# Patient Record
Sex: Male | Born: 1980
Health system: Southern US, Community
[De-identification: ages and names within clinical notes are randomized; demographics above are authoritative.]

## PROBLEM LIST (undated history)

## (undated) DIAGNOSIS — I509 Heart failure, unspecified: Secondary | ICD-10-CM

## (undated) DIAGNOSIS — E119 Type 2 diabetes mellitus without complications: Secondary | ICD-10-CM

## (undated) DIAGNOSIS — K219 Gastro-esophageal reflux disease without esophagitis: Secondary | ICD-10-CM

## (undated) DIAGNOSIS — G4733 Obstructive sleep apnea (adult) (pediatric): Secondary | ICD-10-CM

## (undated) DIAGNOSIS — F1210 Cannabis abuse, uncomplicated: Secondary | ICD-10-CM

## (undated) DIAGNOSIS — I456 Pre-excitation syndrome: Secondary | ICD-10-CM

## (undated) DIAGNOSIS — Z720 Tobacco use: Secondary | ICD-10-CM

## (undated) DIAGNOSIS — I5020 Unspecified systolic (congestive) heart failure: Secondary | ICD-10-CM

## (undated) DIAGNOSIS — I10 Essential (primary) hypertension: Secondary | ICD-10-CM

## (undated) DIAGNOSIS — I428 Other cardiomyopathies: Secondary | ICD-10-CM

## (undated) HISTORY — DX: Heart failure, unspecified: I50.9

## (undated) HISTORY — DX: Obstructive sleep apnea (adult) (pediatric): G47.33

## (undated) HISTORY — DX: Morbid (severe) obesity due to excess calories: E66.01

## (undated) HISTORY — DX: Type 2 diabetes mellitus without complications: E11.9

## (undated) HISTORY — DX: Gastro-esophageal reflux disease without esophagitis: K21.9

## (undated) HISTORY — DX: Essential (primary) hypertension: I10

## (undated) HISTORY — PX: CARDIAC CATHETERIZATION: SHX172

---

## 2000-03-21 HISTORY — PX: FRACTURE SURGERY: SHX138

## 2008-01-30 ENCOUNTER — Emergency Department: Payer: Self-pay | Admitting: Internal Medicine

## 2008-02-04 ENCOUNTER — Ambulatory Visit: Payer: Self-pay | Admitting: Family Medicine

## 2008-02-11 ENCOUNTER — Ambulatory Visit: Payer: Self-pay | Admitting: Family Medicine

## 2009-03-05 ENCOUNTER — Emergency Department (HOSPITAL_COMMUNITY): Admission: EM | Admit: 2009-03-05 | Discharge: 2009-03-05 | Payer: Self-pay | Admitting: Emergency Medicine

## 2010-04-11 ENCOUNTER — Emergency Department (HOSPITAL_COMMUNITY)
Admission: EM | Admit: 2010-04-11 | Discharge: 2010-04-11 | Payer: Self-pay | Source: Home / Self Care | Admitting: Emergency Medicine

## 2010-04-13 ENCOUNTER — Emergency Department (HOSPITAL_COMMUNITY)
Admission: EM | Admit: 2010-04-13 | Discharge: 2010-04-13 | Payer: Self-pay | Source: Home / Self Care | Admitting: Family Medicine

## 2010-04-13 ENCOUNTER — Emergency Department (HOSPITAL_COMMUNITY)
Admission: EM | Admit: 2010-04-13 | Discharge: 2010-04-13 | Payer: Self-pay | Source: Home / Self Care | Admitting: Emergency Medicine

## 2010-04-14 LAB — RAPID STREP SCREEN (MED CTR MEBANE ONLY): Streptococcus, Group A Screen (Direct): NEGATIVE

## 2013-07-09 ENCOUNTER — Emergency Department: Payer: Self-pay | Admitting: Emergency Medicine

## 2013-07-09 LAB — COMPREHENSIVE METABOLIC PANEL
AST: 17 U/L (ref 15–37)
Albumin: 3.4 g/dL (ref 3.4–5.0)
Alkaline Phosphatase: 97 U/L
Anion Gap: 5 — ABNORMAL LOW (ref 7–16)
BUN: 10 mg/dL (ref 7–18)
Bilirubin,Total: 0.3 mg/dL (ref 0.2–1.0)
CREATININE: 0.93 mg/dL (ref 0.60–1.30)
Calcium, Total: 9.4 mg/dL (ref 8.5–10.1)
Chloride: 99 mmol/L (ref 98–107)
Co2: 31 mmol/L (ref 21–32)
EGFR (Non-African Amer.): >60
Glucose: 303 mg/dL — ABNORMAL HIGH (ref 65–99)
Osmolality: 281 (ref 275–301)
POTASSIUM: 3.9 mmol/L (ref 3.5–5.1)
SGPT (ALT): 35 U/L (ref 12–78)
Sodium: 135 mmol/L — ABNORMAL LOW (ref 136–145)
Total Protein: 8.2 g/dL (ref 6.4–8.2)

## 2013-07-09 LAB — CBC
HCT: 39.7 % — ABNORMAL LOW (ref 40.0–52.0)
HGB: 12.9 g/dL — ABNORMAL LOW (ref 13.0–18.0)
MCH: 29 pg (ref 26.0–34.0)
MCHC: 32.4 g/dL (ref 32.0–36.0)
MCV: 89 fL (ref 80–100)
Platelet: 295 10*3/uL (ref 150–440)
RBC: 4.44 10*6/uL (ref 4.40–5.90)
RDW: 13.8 % (ref 11.5–14.5)
WBC: 6.4 10*3/uL (ref 3.8–10.6)

## 2013-07-09 LAB — URINALYSIS, COMPLETE
BACTERIA: NONE SEEN
BILIRUBIN, UR: NEGATIVE
Glucose,UR: >=500 mg/dL (ref 0–75)
Leukocyte Esterase: NEGATIVE
Nitrite: NEGATIVE
Ph: 6 (ref 4.5–8.0)
Protein: 30
Specific Gravity: 1.028 (ref 1.003–1.030)
WBC UR: 1 /HPF (ref 0–5)

## 2013-07-09 LAB — HEMOGLOBIN A1C: HEMOGLOBIN A1C: 10.8 % — AB (ref 4.2–6.3)

## 2013-07-09 LAB — LIPASE, BLOOD: Lipase: 122 U/L (ref 73–393)

## 2013-07-10 ENCOUNTER — Telehealth: Payer: Self-pay | Admitting: Adult Health

## 2013-07-10 DIAGNOSIS — E119 Type 2 diabetes mellitus without complications: Secondary | ICD-10-CM

## 2013-07-10 HISTORY — DX: Type 2 diabetes mellitus without complications: E11.9

## 2013-07-10 NOTE — Telephone Encounter (Signed)
Pt has appointment 07/22/13 with raquel He went to er 07/09/13  Was diagnosed with hernia and type 2 diabetes and armc wanted him to call to see if he could be see sooner  Off on Monday    And needs am appointment.  Goes to work at [employer] pt 07/17/13 @ 3:30

## 2013-07-11 NOTE — Telephone Encounter (Signed)
Please advise if we can move this pt to an earlier appt

## 2013-07-13 NOTE — Telephone Encounter (Signed)
Sorry, I do not have any opening for new pt

## 2013-07-15 NOTE — Telephone Encounter (Signed)
Raquel had cancellation on 4/29 @ 1:30 Offered pt that appointment pt stated he would wait till his appointment on 5/7

## 2013-07-22 ENCOUNTER — Ambulatory Visit (INDEPENDENT_AMBULATORY_CARE_PROVIDER_SITE_OTHER): Payer: BC Managed Care – PPO | Admitting: Adult Health

## 2013-07-22 ENCOUNTER — Encounter: Payer: Self-pay | Admitting: Adult Health

## 2013-07-22 VITALS — BP 148/86 | HR 81 | Resp 13 | Ht 70.0 in | Wt 383.8 lb

## 2013-07-22 DIAGNOSIS — E669 Obesity, unspecified: Secondary | ICD-10-CM

## 2013-07-22 DIAGNOSIS — K429 Umbilical hernia without obstruction or gangrene: Secondary | ICD-10-CM

## 2013-07-22 DIAGNOSIS — R3129 Other microscopic hematuria: Secondary | ICD-10-CM

## 2013-07-22 DIAGNOSIS — E1169 Type 2 diabetes mellitus with other specified complication: Secondary | ICD-10-CM

## 2013-07-22 DIAGNOSIS — R361 Hematospermia: Secondary | ICD-10-CM

## 2013-07-22 DIAGNOSIS — E119 Type 2 diabetes mellitus without complications: Secondary | ICD-10-CM

## 2013-07-22 LAB — POCT URINALYSIS DIPSTICK
Bilirubin, UA: NEGATIVE
Glucose, UA: NEGATIVE
KETONES UA: NEGATIVE
Leukocytes, UA: NEGATIVE
Nitrite, UA: NEGATIVE
PH UA: 7.5
SPEC GRAV UA: 1.02
Urobilinogen, UA: 0.2

## 2013-07-22 MED ORDER — ONETOUCH ULTRASOFT LANCETS MISC: Status: DC

## 2013-07-22 MED ORDER — GLUCOSE BLOOD VI STRP: ORAL_STRIP | Status: DC

## 2013-07-22 NOTE — Patient Instructions (Signed)
   Thank you for choosing [place] [facility] for your health care needs.  Please have your labs drawn prior to leaving the office.  I will call you with the results and further instruction.  I am referring you to a general surgeon for consultation on your umbilical hernia. You will need to get your diabetes well controlled prior to having surgery.  Return for follow up in 1 month for your diabetes.  Please check your blood sugar 4 times a day (before breakfast, before lunch, before dinner and before bedtime).  Record all and bring with you on your next appointment.  Walk 3-4 times a week. Walk for 20-30 minutes.   I am referring you for diabetic education. They will call you with an appointment.

## 2013-07-22 NOTE — Progress Notes (Signed)
Pre visit review using our clinic review tool, if applicable. No additional management support is needed unless otherwise documented below in the visit note. 

## 2013-07-22 NOTE — Progress Notes (Signed)
Subjective:    Patient ID: [place], male    DOB: 07/25/1980, 33 y.o.   MRN: [id number]  HPI Pt is a 33 y/o male who presents to clinic to establish care. Has not had a primary care in many years. Recently was seen at [facility] ED. Diagnosed with diabetes and started on metformin. He does not have a glucometer. Has tried to diet and reports that he will go days with barely eating anything then he gets so hungry he binges. He knows he has to lose weight and has also started to walk. Pt has several additional concerns:  1) Umbilical hernia. Pt first felt a knot on his abdomen a few months ago. Reports that sometimes it sticks out more than other times. No problems with his bowels. The hernia is uncomfortable. He had films done while in the ED. Will request records.  2) Microscopic hematuria - he reports that he has been told that he has blood in his urine in the past. He has been seen by a urologist is the past. Does not remember the doctors name.  3) blood in semen - He first noticed this approximately 10 years ago. This is still ongoing. [facility] and he reports that they have never been able to tell him why this is happening.   Past Medical History  Diagnosis Date  . Diabetes mellitus without complication 07/10/13  . GERD (gastroesophageal reflux disease)   . Hypertension      Past Surgical History  Procedure Laterality Date  . Fracture surgery Left 2002    fractured arm     Family History  Problem Relation Age of Onset  . Diabetes Maternal Grandmother   . Diabetes Paternal Grandfather      History   Social History  . Marital Status: Single    Spouse Name: N/A    Number of Children: 2  . Years of Education: N/A   Occupational History  . Customer Care Representative     [employer]   Social History Main Topics  . Smoking status: Former Smoker -- 0.25 packs/day for 15 years    Types: Cigarettes    Quit date: 01/22/2013  . Smokeless tobacco: Never Used    . Alcohol Use: Yes     Comment: Occassional beer, shots of liquor  . Drug Use: 2.00 per week    Special: Marijuana     Comment: 1 time per week  . Sexual Activity: Not on file   Other Topics Concern  . Not on file   Social History Narrative   [place] He is currently living in [place]. He has a daughter and son [place], [place]). 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     Review of Systems  Constitutional: Negative.   HENT: Negative.   Eyes: Negative.   Respiratory: Negative.   Cardiovascular: Negative.   Gastrointestinal: Negative.  Negative for nausea and vomiting.       Umbilical hernia  Endocrine: Negative.   Genitourinary: Negative.  Negative for dysuria, difficulty urinating and testicular pain. Hematuria: microscopic hematuria hx of.       Blood in semen. Reports this [facility]  Musculoskeletal: Negative.   Skin: Negative.   Allergic/Immunologic: Negative.   Neurological: Negative.   Hematological: Negative.   Psychiatric/Behavioral: Negative.   All other systems reviewed and are negative.      Objective:   Physical Exam  Constitutional: He is oriented to person, place,  and time.  Pleasant 33 y/o male in NAD. Newly diagnosed with diabetes. Morbid obesity  Eyes: Conjunctivae and EOM are normal.  Neck: Normal range of motion. Neck supple.  Cardiovascular: Normal rate, regular rhythm and normal heart sounds.  Exam reveals no gallop and no friction rub.   No murmur heard. Pulmonary/Chest: Effort normal and breath sounds normal. No respiratory distress. He has no wheezes. He has no rales.  Abdominal: Soft.  Umbilical hernia right above umbilicus  Neurological: He is alert and oriented to person, place, and time. Coordination normal.  Psychiatric: He has a normal mood and affect. His behavior is normal. Judgment and thought content normal.      Assessment & Plan:    1. Blood in semen Unable to obtain any records through care  everywhere. Check urinalysis, culture. Consider referral to Urology. - POCT urinalysis dipstick  2. Diabetes mellitus type 2 in obese Check hgbA1c. Request other labs done at [facility]. Adjust medications depending on results. Education regarding use of glucometer. I am referring him for diabetic education. Encouraged to increase activity. Spent > 30 min in face to face communication pertaining to problems listed and in the education, evaluation, planning and implementation of care pertaining to this problem. Return in 1 month for follow up with BG readings. - Microalbumin, urine - Ambulatory referral to diabetic education - Hemoglobin A1c; Future  3. Microscopic hematuria Check urine. - Urinalysis, Routine w reflex microscopic  4. Umbilical hernia Refer to sgy. Discussed with pt that he will need to get his BG under control prior to any surgery. I am referring him to discuss options and for planning surgery if needed. Request films from hospital. - Ambulatory referral to General Surgery

## 2013-07-23 ENCOUNTER — Other Ambulatory Visit: Payer: BC Managed Care – PPO

## 2013-07-23 LAB — MICROALBUMIN, URINE: MICROALB UR: 5.39 mg/dL — AB (ref 0.00–1.89)

## 2013-07-24 ENCOUNTER — Other Ambulatory Visit: Payer: Self-pay | Admitting: Adult Health

## 2013-07-24 DIAGNOSIS — R361 Hematospermia: Secondary | ICD-10-CM

## 2013-07-24 LAB — URINALYSIS, ROUTINE W REFLEX MICROSCOPIC
Bilirubin Urine: NEGATIVE
Hgb urine dipstick: NEGATIVE
KETONES UR: NEGATIVE
LEUKOCYTES UA: NEGATIVE
Nitrite: NEGATIVE
PH: 7.5 (ref 5.0–8.0)
RBC / HPF: NONE SEEN (ref 0–?)
SPECIFIC GRAVITY, URINE: 1.02 (ref 1.000–1.030)
TOTAL PROTEIN, URINE-UPE24: NEGATIVE
URINE GLUCOSE: NEGATIVE
Urobilinogen, UA: 0.2 (ref 0.0–1.0)
WBC, UA: NONE SEEN (ref 0–?)

## 2013-07-30 ENCOUNTER — Ambulatory Visit: Payer: Self-pay | Admitting: General Surgery

## 2013-07-31 ENCOUNTER — Telehealth: Payer: Self-pay

## 2013-07-31 NOTE — Telephone Encounter (Signed)
Relevant patient education assigned to patient using Emmi. ° °

## 2013-08-13 ENCOUNTER — Encounter: Payer: Self-pay | Admitting: *Deleted

## 2013-08-27 ENCOUNTER — Encounter: Payer: Self-pay | Admitting: Adult Health

## 2013-08-27 ENCOUNTER — Ambulatory Visit (INDEPENDENT_AMBULATORY_CARE_PROVIDER_SITE_OTHER): Payer: BC Managed Care – PPO | Admitting: Adult Health

## 2013-08-27 VITALS — BP 144/90 | HR 70 | Temp 98.0°F | Resp 14 | Wt 377.0 lb

## 2013-08-27 DIAGNOSIS — K429 Umbilical hernia without obstruction or gangrene: Secondary | ICD-10-CM

## 2013-08-27 DIAGNOSIS — E118 Type 2 diabetes mellitus with unspecified complications: Secondary | ICD-10-CM

## 2013-08-27 DIAGNOSIS — E669 Obesity, unspecified: Secondary | ICD-10-CM

## 2013-08-27 DIAGNOSIS — E1165 Type 2 diabetes mellitus with hyperglycemia: Secondary | ICD-10-CM | POA: Insufficient documentation

## 2013-08-27 DIAGNOSIS — E785 Hyperlipidemia, unspecified: Secondary | ICD-10-CM

## 2013-08-27 DIAGNOSIS — R809 Proteinuria, unspecified: Secondary | ICD-10-CM | POA: Insufficient documentation

## 2013-08-27 DIAGNOSIS — IMO0001 Reserved for inherently not codable concepts without codable children: Secondary | ICD-10-CM

## 2013-08-27 DIAGNOSIS — E119 Type 2 diabetes mellitus without complications: Secondary | ICD-10-CM

## 2013-08-27 DIAGNOSIS — R361 Hematospermia: Secondary | ICD-10-CM

## 2013-08-27 DIAGNOSIS — IMO0002 Reserved for concepts with insufficient information to code with codable children: Secondary | ICD-10-CM | POA: Insufficient documentation

## 2013-08-27 DIAGNOSIS — E1169 Type 2 diabetes mellitus with other specified complication: Secondary | ICD-10-CM | POA: Insufficient documentation

## 2013-08-27 MED ORDER — LISINOPRIL 10 MG PO TABS
10.0000 mg | ORAL_TABLET | Freq: Every day | ORAL | Status: DC
Start: 2013-08-27 — End: 2014-03-18

## 2013-08-27 NOTE — Progress Notes (Signed)
Subjective:    Patient ID: [place]vin Bender[place]ario S [place]ugh[place], male    DOB: 09/03/1980, 33 y.o.   MRN: [id number]  HPI 33 yo AA male with history of Type 2 diabetes and hernia presents today for f/u on his diabetes. Provides conflicting information about taking his Metformin. States that he takes it most days with the occasional forgetfulness of a full day or evening dose. Difficulties checking his blood sugar at home because he does not like needles and cannot use the lancet. A couple of occasions of polyuria and polydipsia. Denies changes of sensation and has not been performing foot checks. Denies hypertrophy of gums or bleeding.    Past Medical History  Diagnosis Date  . Diabetes mellitus without complication 07/10/13  . GERD (gastroesophageal reflux disease)   . Hypertension     Current Outpatient Prescriptions on File Prior to Visit  Medication Sig Dispense Refill  . glucose blood (BAYER CONTOUR NEXT TEST) test strip Use as instructed  100 each  5  . Lancets (ONETOUCH ULTRASOFT) lancets Use as instructed  100 each  5  . metFORMIN (GLUCOPHAGE) 500 MG tablet Take 500 mg by mouth 2 (two) times daily with a meal.      . oxyCODONE-acetaminophen (PERCOCET/ROXICET) 5-325 MG per tablet Take 1 tablet by mouth every 6 (six) hours as needed for severe pain.       No current facility-administered medications on file prior to visit.     Review of Systems  All other systems reviewed and are negative. Other than HPI     Objective:  BP 144/90  Pulse 70  Temp(Src) 98 F (36.7 C) (Oral)  Resp 14  Wt 377 lb (171.006 kg)  SpO2 97%   Physical Exam  Constitutional: He is oriented to person, place, and time.  Obese AA male seated in the chair in NAD. Speech is clear and appropriate. Dressed appropriately for situation. Mood and affect are appropriate and matching.   Eyes: Conjunctivae, EOM and lids are normal. Pupils are equal, round, and reactive to light.  Cardiovascular: Normal rate, regular rhythm, S1  normal, S2 normal and normal pulses.  Exam reveals no gallop.   No murmur heard. Pulmonary/Chest: Effort normal and breath sounds normal.  Neurological: He is alert and oriented to person, place, and time. He has normal reflexes. No sensory deficit.  Bilateral foot sensation intact to vibration and soft/dull.      Assessment & Plan:   1. Diabetes type 2, uncontrolled Does not check blood glucose because of fear of needles. Discrepancy in report of whether or not he takes medication. Discussion and education regarding importance of controlling diabetes to prevent end organ damage. Discussed that I am checking A1c and may need to add 2nd medication. Needs to watch diet. Food exam with good sensation, pulses. Significant onychomycosis with difficulty cutting toe nails. Has frequent ingrown toenails. Refer to podiatry. Needs eye exam as well. Spent greater than 30 min in face to face communication with pt regarding diabetes, education, medication management. Follow up 1 month.  - Hemoglobin A1c; Future - Ambulatory referral to Podiatry - Ambulatory referral to Ophthalmology - Hemoglobin A1c  2. HLD (hyperlipidemia) Checking lipid panel today. Follow - Lipid panel  3. Microalbuminuria Noted in urine done in May. Start lisinopril. Discussed with pt. Follow up in 1 month  4. Umbilical hernia Missed his previous appt. He reports that his cell phone is hardly ever on. Gave us his home number so that we can call him with  information of appointments. - Ambulatory referral to General Surgery  5. Blood in semen As he was leaving the office he reports that he is still having blood in semen. I am referring him to urology - Ambulatory referral to Urology

## 2013-08-27 NOTE — Progress Notes (Signed)
Pre visit review using our clinic review tool, if applicable. No additional management support is needed unless otherwise documented below in the visit note. 

## 2013-08-30 ENCOUNTER — Other Ambulatory Visit: Payer: Self-pay | Admitting: *Deleted

## 2013-08-30 MED ORDER — METFORMIN HCL 500 MG PO TABS: 500.0000 mg | ORAL_TABLET | Freq: Two times a day (BID) | ORAL | Status: DC

## 2013-09-02 ENCOUNTER — Telehealth: Payer: Self-pay | Admitting: Adult Health

## 2013-09-02 NOTE — Telephone Encounter (Signed)
Attempted to contact patient called both numbers on file. One was not in service at this time. The other continued to ring with no option to leave a message. Will try again later.

## 2013-09-02 NOTE — Telephone Encounter (Signed)
Please call to find out about his labs. He has not gone to get labs done???

## 2013-09-04 ENCOUNTER — Other Ambulatory Visit (INDEPENDENT_AMBULATORY_CARE_PROVIDER_SITE_OTHER): Payer: BC Managed Care – PPO

## 2013-09-04 ENCOUNTER — Encounter: Payer: Self-pay | Admitting: *Deleted

## 2013-09-04 DIAGNOSIS — E119 Type 2 diabetes mellitus without complications: Secondary | ICD-10-CM

## 2013-09-04 DIAGNOSIS — E785 Hyperlipidemia, unspecified: Secondary | ICD-10-CM

## 2013-09-04 LAB — LIPID PANEL
Cholesterol: 170 mg/dL (ref 0–200)
HDL: 28.4 mg/dL — ABNORMAL LOW (ref >39.00)
LDL Cholesterol: 86 mg/dL (ref 0–99)
NonHDL: 141.6
Total CHOL/HDL Ratio: 6
Triglycerides: 276 mg/dL — ABNORMAL HIGH (ref 0.0–149.0)
VLDL: 55.2 mg/dL — ABNORMAL HIGH (ref 0.0–40.0)

## 2013-09-04 LAB — HEMOGLOBIN A1C: HEMOGLOBIN A1C: 8.7 % — AB (ref 4.6–6.5)

## 2013-09-04 NOTE — Addendum Note (Signed)
Addended by: Baldomero Lamy on: 09/04/2013 10:47 AM   Modules accepted: Orders

## 2013-09-04 NOTE — Addendum Note (Signed)
Addended by: Baldomero Lamy on: 09/04/2013 10:49 AM   Modules accepted: Orders

## 2013-09-05 ENCOUNTER — Other Ambulatory Visit: Payer: Self-pay | Admitting: Adult Health

## 2013-09-05 MED ORDER — GLYBURIDE 5 MG PO TABS: 5.0000 mg | ORAL_TABLET | Freq: Every day | ORAL | Status: DC

## 2013-09-05 NOTE — Telephone Encounter (Signed)
Labs received

## 2013-09-11 ENCOUNTER — Encounter: Payer: Self-pay | Admitting: *Deleted

## 2013-09-24 ENCOUNTER — Ambulatory Visit: Payer: BC Managed Care – PPO | Admitting: *Deleted

## 2013-10-01 ENCOUNTER — Ambulatory Visit: Payer: BC Managed Care – PPO | Admitting: Adult Health

## 2013-10-01 DIAGNOSIS — Z0289 Encounter for other administrative examinations: Secondary | ICD-10-CM

## 2013-10-11 ENCOUNTER — Telehealth: Payer: Self-pay | Admitting: *Deleted

## 2013-10-11 NOTE — Telephone Encounter (Signed)
Chart reviewed for DM bundle. Was to f/u in July, DNKA Unable to reach pt by phone, letter sent on appt reminder and to call and schedule.

## 2013-10-22 ENCOUNTER — Ambulatory Visit: Payer: Self-pay | Admitting: General Surgery

## 2013-10-23 ENCOUNTER — Other Ambulatory Visit: Payer: Self-pay

## 2013-10-23 NOTE — Telephone Encounter (Signed)
Patient called requesting a refill on all of medication. When chart was checked all medications still have refills available. Tried contacting patient to let him know that he already has refills and he needs to call [facility] to get them. Patient did not answer I called several times because patient is completely out of 1 of his medications. Will call again.

## 2013-10-24 ENCOUNTER — Encounter: Payer: Self-pay | Admitting: *Deleted

## 2013-10-29 ENCOUNTER — Ambulatory Visit (INDEPENDENT_AMBULATORY_CARE_PROVIDER_SITE_OTHER): Payer: BC Managed Care – PPO | Admitting: Adult Health

## 2013-10-29 ENCOUNTER — Encounter (INDEPENDENT_AMBULATORY_CARE_PROVIDER_SITE_OTHER): Payer: Self-pay

## 2013-10-29 ENCOUNTER — Encounter: Payer: Self-pay | Admitting: Adult Health

## 2013-10-29 VITALS — BP 142/88 | HR 77 | Temp 97.9°F | Resp 14 | Wt 384.2 lb

## 2013-10-29 DIAGNOSIS — R0989 Other specified symptoms and signs involving the circulatory and respiratory systems: Secondary | ICD-10-CM

## 2013-10-29 DIAGNOSIS — E1165 Type 2 diabetes mellitus with hyperglycemia: Secondary | ICD-10-CM

## 2013-10-29 DIAGNOSIS — IMO0001 Reserved for inherently not codable concepts without codable children: Secondary | ICD-10-CM

## 2013-10-29 DIAGNOSIS — K429 Umbilical hernia without obstruction or gangrene: Secondary | ICD-10-CM

## 2013-10-29 DIAGNOSIS — G4733 Obstructive sleep apnea (adult) (pediatric): Secondary | ICD-10-CM | POA: Insufficient documentation

## 2013-10-29 DIAGNOSIS — IMO0002 Reserved for concepts with insufficient information to code with codable children: Secondary | ICD-10-CM

## 2013-10-29 DIAGNOSIS — M722 Plantar fascial fibromatosis: Secondary | ICD-10-CM

## 2013-10-29 DIAGNOSIS — R361 Hematospermia: Secondary | ICD-10-CM

## 2013-10-29 DIAGNOSIS — R0683 Snoring: Secondary | ICD-10-CM

## 2013-10-29 DIAGNOSIS — R0609 Other forms of dyspnea: Secondary | ICD-10-CM

## 2013-10-29 HISTORY — DX: Obstructive sleep apnea (adult) (pediatric): G47.33

## 2013-10-29 NOTE — Progress Notes (Signed)
Patient ID: [place] Bender[place]ario S [place], male   DOB: 06/22/1980, 33 y.o.   MRN: [id number]    Subjective:    Patient ID: [place], male    DOB: 02/10/1981, 33 y.o.   MRN: [id number]  HPI  Pt is a pleasant 33 y/o male who presents to clinic for follow up:  1. Snoring Reports that family members have told him that he snores and stops breathing. They have had to wake him up because he has stopped breathing and they became very concerned. Stays tired all day. Falls asleep anywhere (traffic light, on the telephone, in meetings)  2. Umbilical hernia Diagnosed when he went to [facility] ED. Pt has been referred to surgery twice for evaluation and he has missed both appointments. He reports that he is only off on Tuesdays and it has been difficult for him to do everything he needs to do in one day. He assures me that he will not miss any more appointments.  3. Diabetes type 2, uncontrolled He has been taking his medications as prescribed. Diet is still an issue. He reports that he "eats all the time".   4. Blood in semen He reports that this has resolved.  5. Feet pain Pain is worse when he first wakes up and standing after he has been sitting for a while. He was trying to walk but has stopped secondary to the pain in his feet.   Past Medical History  Diagnosis Date  . Diabetes mellitus without complication 07/10/13  . GERD (gastroesophageal reflux disease)   . Hypertension     Current Outpatient Prescriptions on File Prior to Visit  Medication Sig Dispense Refill  . glucose blood (BAYER CONTOUR NEXT TEST) test strip Use as instructed  100 each  5  . glyBURIDE (DIABETA) 5 MG tablet Take 1 tablet (5 mg total) by mouth daily with breakfast.  30 tablet  3  . Lancets (ONETOUCH ULTRASOFT) lancets Use as instructed  100 each  5  . lisinopril (PRINIVIL,ZESTRIL) 10 MG tablet Take 1 tablet (10 mg total) by mouth daily.  30 tablet  3  . metFORMIN (GLUCOPHAGE) 500 MG tablet Take 1 tablet (500 mg total) by mouth 2  (two) times daily with a meal.  60 tablet  5   No current facility-administered medications on file prior to visit.     Review of Systems  Constitutional: Positive for fatigue.  Respiratory: Negative.   Cardiovascular: Negative.   Genitourinary: Negative for dysuria, urgency, frequency, scrotal swelling, difficulty urinating and testicular pain.  Musculoskeletal:       Pain in both feet.  Neurological: Negative.   Psychiatric/Behavioral: Positive for sleep disturbance (observed snoring and apneic episodes).       Objective:  BP 142/88  Pulse 77  Temp(Src) 97.9 F (36.6 C) (Oral)  Resp 14  Wt 384 lb 4 oz (174.295 kg)  SpO2 97%   Physical Exam  Constitutional: He is oriented to person, place, and time. He appears well-developed and well-nourished. No distress.  HENT:  Head: Normocephalic and atraumatic.  Eyes: Conjunctivae and EOM are normal.  Cardiovascular: Normal rate, regular rhythm, normal heart sounds and intact distal pulses.  Exam reveals no gallop and no friction rub.   No murmur heard. Pulmonary/Chest: Effort normal and breath sounds normal. No respiratory distress. He has no wheezes. He has no rales.  Musculoskeletal: Normal range of motion.  Neurological: He is alert and oriented to person, place, and time.  Skin: Skin is warm and dry.  Psychiatric: He has a normal mood and affect. His behavior is normal. Judgment and thought content normal.      Assessment & Plan:   1. Snoring Will refer to have sleep study. His symptoms are concerning for severe sleep apnea.  2. Umbilical hernia, recurrence not specified Refer to general surgery for evaluation  3. Diabetes mellitus type 2, uncontrolled Check A1c. Continue meds and follow  4. Blood in semen This has resolved. We discussed possible causes and that most are benign. If occurs again we will w/u.  5. Plantar fasciitis, bilateral Discussed stretches, frozen water bottle or tennis ball to roll on bottom of  feet, massage, stretches, boot to sleep in and keep fascia from contracting. He needs to lose weight which is contributing to many of his problems. This was discussed in detail. If no improvement will refer to podiatry.

## 2013-10-29 NOTE — Progress Notes (Signed)
Pre visit review using our clinic review tool, if applicable. No additional management support is needed unless otherwise documented below in the visit note. 

## 2013-11-01 ENCOUNTER — Telehealth: Payer: Self-pay

## 2013-11-01 NOTE — Telephone Encounter (Signed)
The patient's sleep study order has been faxed to [facility] to [name] who handle their sleep studies  Phone- [phone] Fax- [phone]

## 2013-11-26 ENCOUNTER — Ambulatory Visit (INDEPENDENT_AMBULATORY_CARE_PROVIDER_SITE_OTHER): Payer: BC Managed Care – PPO | Admitting: General Surgery

## 2013-11-26 ENCOUNTER — Encounter: Payer: Self-pay | Admitting: General Surgery

## 2013-11-26 VITALS — BP 142/90 | HR 88 | Resp 16 | Ht 70.0 in | Wt 382.0 lb

## 2013-11-26 DIAGNOSIS — K420 Umbilical hernia with obstruction, without gangrene: Secondary | ICD-10-CM

## 2013-11-26 NOTE — Patient Instructions (Signed)
Patient to be scheduled for hernia repair. The patient is aware to call back for any questions or concerns.  Open Hernia Repair Open hernia repair is surgery to fix a hernia. A hernia occurs when an internal organ or tissue pushes out through a weak spot in the abdominal wall muscles. Hernias commonly occur in the groin and around the navel. Most hernias tend to get worse over time. Surgery is often done to prevent the hernia from getting bigger, becoming uncomfortable, or becoming an emergency. Emergency surgery may be needed if abdominal contents get stuck in the opening (incarcerated hernia) or the blood supply gets cut off (strangulated hernia). In an open repair, a large cut (incision) is made in the abdomen to perform the surgery. LET [facility] CARE PROVIDER KNOW ABOUT:  Any allergies you have.  All medicines you are taking, including vitamins, herbs, eye drops, creams, and over-the-counter medicines.  Previous problems you or members of your family have had with the use of anesthetics.  Any blood disorders you have.  Previous surgeries you have had.  Medical conditions you have. RISKS AND COMPLICATIONS Generally, this is a safe procedure. However, as with any procedure, complications can occur. Possible complications include:  Infection.  Bleeding.  Nerve injury.  Chronic pain.  The hernia can come back.  Injury to the intestines. BEFORE THE PROCEDURE  Ask your health care provider about changing or stopping any regular medicines. Avoid taking aspirin or blood thinners as directed by your health care provider.  Do noteat or drink anything after midnight the night before surgery.  If you smoke, do not smoke for at least 2 weeks before your surgery.  Do not drink alcohol the day before your surgery.  Let your health care provider know if you develop a cold or any infection before your surgery.  Arrange for someone to drive you home after the procedure or after your  hospital stay. Also arrange for someone to help you with activities during recovery. PROCEDURE   Small monitors will be put on your body. They are used to check your heart, blood pressure, and oxygen level.   An IV access tube will be put into one of your veins. Medicine will be able to flow directly into your body through this IV tube.   You might be given a medicine to help you relax (sedative).   You will be given a medicine to make you sleep (general anesthetic). A breathing tube may be placed into your lungs during the procedure.  A cut (incision) is made over the hernia defect, and the contents are pushed back into the abdomen.  If the hernia is small, stitches may be used to bring the muscle edges back together.  Typically, a surgeon will place a mesh patch made of man-made material (synthetic) to cover the defect. The mesh is sewn to healthy muscle. This reduces the risk of the hernia coming back.  The tissue and skin over the hernia are then closed with stitches or staples.  If the hernia was large, a drain may be left in place to collect excess fluid where the hernia used to be.  Bandages (dressings) are used to cover the incision. AFTER THE PROCEDURE  You will be taken to a recovery area where your progress will be monitored.  If the hernia was small or in the groin (inguinal) region, you will likely be allowed to go home once you are awake, stable, and taking fluids well.  If the hernia was  large, you may have to wait for your bowel function to return. You may need to stay in the hospital for 2-3 days until you can eat and your pain is controlled. A drain may be left in place for 5-7 days. You will be taught how to care for the drain. Document Released: 08/31/2000 Document Revised: 12/26/2012 Document Reviewed: 10/17/2012 [facility] Patient Information 2015 [place], [place]. This information is not intended to replace advice given to you by your health care provider. Make  sure you discuss any questions you have with your health care provider.

## 2013-11-26 NOTE — Progress Notes (Signed)
Patient ID: [place], male   DOB: 06/15/1980, 33 y.o.   MRN: [id number]  The patient is scheduled for surgery at [facility] on 12/16/13. He will pre admit by phone on 12/10/13. Patient is aware of date and instructions.

## 2013-11-26 NOTE — Progress Notes (Signed)
Patient ID: [place], male   DOB: [birth year], 33 y.o.   MRN: [id number]  Chief Complaint  Patient presents with  . Other    umbilical hernia    HPI [place] is a 33 y.o. male who presents for an evaluation of an umbilical hernia. The patient states the hernia has been present for approximately 1 year. He states he has pain in this area at times. He typically notices the pain when lifting, pushing, or laying on his stomach.   HPI  Past Medical History  Diagnosis Date  . Diabetes mellitus without complication 07/10/13  . GERD (gastroesophageal reflux disease)   . Hypertension     Past Surgical History  Procedure Laterality Date  . Fracture surgery Left 2002    fractured arm    Family History  Problem Relation Age of Onset  . Diabetes Maternal Grandmother   . Diabetes Paternal Grandfather     Social History History  Substance Use Topics  . Smoking status: Former Smoker -- 0.25 packs/day for 15 years    Types: Cigarettes    Quit date: 01/22/2013  . Smokeless tobacco: Never Used  . Alcohol Use: Yes     Comment: Occassional beer, shots of liquor    No Known Allergies  Current Outpatient Prescriptions  Medication Sig Dispense Refill  . glucose blood (BAYER CONTOUR NEXT TEST) test strip Use as instructed  100 each  5  . glyBURIDE (DIABETA) 5 MG tablet Take 1 tablet (5 mg total) by mouth daily with breakfast.  30 tablet  3  . Lancets (ONETOUCH ULTRASOFT) lancets Use as instructed  100 each  5  . lisinopril (PRINIVIL,ZESTRIL) 10 MG tablet Take 1 tablet (10 mg total) by mouth daily.  30 tablet  3  . metFORMIN (GLUCOPHAGE) 500 MG tablet Take 1 tablet (500 mg total) by mouth 2 (two) times daily with a meal.  60 tablet  5   No current facility-administered medications for this visit.    Review of Systems Review of Systems  Constitutional: Negative.   Respiratory: Negative.   Cardiovascular: Negative.   Gastrointestinal: Positive for abdominal pain.    Blood  pressure 142/90, pulse 88, resp. rate 16, height 5\' 10"  (1.778 m), weight 382 lb (173.274 kg).  Physical Exam Physical Exam  Constitutional: He is oriented to person, place, and time.  Cardiovascular: Normal rate, regular rhythm and normal heart sounds.   No murmur heard. Pulmonary/Chest: Effort normal and breath sounds normal.  Abdominal: Soft. Normal appearance and bowel sounds are normal. There is no hepatosplenomegaly. There is tenderness. A hernia is present.    Neurological: He is alert and oriented to person, place, and time.  Skin: Skin is warm and dry.    Data Reviewed None  Assessment    Umbilical hernia present. This is irreducible and likely has chronically incarcerated omentum. Pt is overweight and diabetic. Although he has increased chance for recurrence based on risk factors, he has an incarcerated hernia and warrants repair. All of this, procedure explained to him. He is agreeable.    Plan    Patient to be scheduled for open umbilical hernia repair. Risks and benefits explained and patient agreeable.        Mckale Haffey G 11/26/2013, 3:58 PM

## 2013-12-02 ENCOUNTER — Other Ambulatory Visit: Payer: Self-pay | Admitting: General Surgery

## 2013-12-02 DIAGNOSIS — K420 Umbilical hernia with obstruction, without gangrene: Secondary | ICD-10-CM

## 2013-12-10 ENCOUNTER — Ambulatory Visit: Payer: Self-pay | Admitting: Anesthesiology

## 2013-12-10 LAB — BASIC METABOLIC PANEL
Anion Gap: 4 — ABNORMAL LOW (ref 7–16)
BUN: 11 mg/dL (ref 7–18)
Calcium, Total: 8.6 mg/dL (ref 8.5–10.1)
Chloride: 105 mmol/L (ref 98–107)
Co2: 31 mmol/L (ref 21–32)
Creatinine: 0.97 mg/dL (ref 0.60–1.30)
GLUCOSE: 60 mg/dL — AB (ref 65–99)
OSMOLALITY: 277 (ref 275–301)
POTASSIUM: 3.6 mmol/L (ref 3.5–5.1)
Sodium: 140 mmol/L (ref 136–145)

## 2013-12-16 ENCOUNTER — Encounter: Payer: Self-pay | Admitting: General Surgery

## 2013-12-16 ENCOUNTER — Encounter: Payer: Self-pay | Admitting: *Deleted

## 2013-12-16 ENCOUNTER — Ambulatory Visit: Payer: Self-pay | Admitting: General Surgery

## 2013-12-16 DIAGNOSIS — K420 Umbilical hernia with obstruction, without gangrene: Secondary | ICD-10-CM

## 2013-12-16 HISTORY — PX: HERNIA REPAIR: SHX51

## 2013-12-17 ENCOUNTER — Telehealth: Payer: Self-pay | Admitting: *Deleted

## 2013-12-17 NOTE — Telephone Encounter (Signed)
Advised to use ibuprofen as needed and to make use of a hating pad for comfort.

## 2013-12-17 NOTE — Telephone Encounter (Signed)
Pt called and said he had hernia surgery yesterday and he is still in a lot of pain, he wants to know is this normal?

## 2013-12-18 ENCOUNTER — Encounter: Payer: Self-pay | Admitting: General Surgery

## 2013-12-18 LAB — PATHOLOGY REPORT

## 2013-12-19 ENCOUNTER — Telehealth: Payer: Self-pay | Admitting: *Deleted

## 2013-12-19 NOTE — Telephone Encounter (Signed)
Pt called and had surgery on 12/16/13 and he said that now he has a very painful cough in his chest and he doesn't know what he needs to do.

## 2013-12-19 NOTE — Telephone Encounter (Signed)
He says he does feel better today. Advised he soul use some OTC cough medications if necessary. He is aware to support abdomen when coughing.

## 2013-12-25 ENCOUNTER — Ambulatory Visit (INDEPENDENT_AMBULATORY_CARE_PROVIDER_SITE_OTHER): Payer: Self-pay | Admitting: General Surgery

## 2013-12-25 ENCOUNTER — Encounter: Payer: Self-pay | Admitting: General Surgery

## 2013-12-25 VITALS — BP 132/82 | HR 84 | Resp 14 | Ht 71.0 in | Wt 379.0 lb

## 2013-12-25 DIAGNOSIS — K420 Umbilical hernia with obstruction, without gangrene: Secondary | ICD-10-CM

## 2013-12-25 NOTE — Progress Notes (Signed)
Here today for postoperative visit, umbilical hernia on 12-16-13. He states he has run out of pain medications and he is still having a lot of pain.  Well healed umbilical incision. He is to continue to support the area when coughing or sneezing. He may use a heating pad as needed for comfort. RX for pain medication and may use aleve twice a day.

## 2013-12-25 NOTE — Patient Instructions (Addendum)
He is to continue to support the area when coughing or sneezing. He may use a heating pad as needed for comfort. may use aleve twice a day.

## 2014-01-21 ENCOUNTER — Ambulatory Visit: Payer: BC Managed Care – PPO | Admitting: Family Medicine

## 2014-01-21 DIAGNOSIS — Z0289 Encounter for other administrative examinations: Secondary | ICD-10-CM

## 2014-01-29 ENCOUNTER — Ambulatory Visit: Payer: BC Managed Care – PPO | Admitting: General Surgery

## 2014-02-06 ENCOUNTER — Encounter: Payer: Self-pay | Admitting: *Deleted

## 2014-03-18 ENCOUNTER — Telehealth: Payer: Self-pay | Admitting: Family Medicine

## 2014-03-18 MED ORDER — GLYBURIDE 5 MG PO TABS: 5.0000 mg | ORAL_TABLET | Freq: Every day | ORAL | Status: DC

## 2014-03-18 MED ORDER — LISINOPRIL 10 MG PO TABS: 10.0000 mg | ORAL_TABLET | Freq: Every day | ORAL | Status: DC

## 2014-03-18 NOTE — Telephone Encounter (Signed)
Pt is scheduled for 03/24/14 to est with you: transfer from [facility]. Please review chart. Pt states he will run out of glyBURIDE (DIABETA) 5 MG tablet and lisinopril (PRINIVIL,ZESTRIL) 10 MG tablet before his appointment.  [facility] [address]  Pt states he has had a ruff couple of months with hernia surgery and stress at work. I know from our phone conversation he wants to talk about plantar fascitis, gastric bypass, and sleep apnea. He states his girlfriend can hear him stop breathing in his sleep. He has not been checking his sugar the way he needs to. He states it is a new year and it's time to focus on his health.

## 2014-03-18 NOTE — Telephone Encounter (Signed)
Refilled meds x 1 month. Pt needs to schedule appt in next 2 weeks and needs to keep that appt.

## 2014-03-18 NOTE — Telephone Encounter (Signed)
Appt has been scheduled for 03/24/14.

## 2014-03-24 ENCOUNTER — Encounter: Payer: Self-pay | Admitting: Family Medicine

## 2014-03-24 ENCOUNTER — Ambulatory Visit (INDEPENDENT_AMBULATORY_CARE_PROVIDER_SITE_OTHER): Payer: BLUE CROSS/BLUE SHIELD | Admitting: Family Medicine

## 2014-03-24 VITALS — BP 150/90 | HR 88 | Temp 98.8°F | Ht 71.0 in | Wt 389.5 lb

## 2014-03-24 DIAGNOSIS — Z6841 Body Mass Index (BMI) 40.0 and over, adult: Secondary | ICD-10-CM

## 2014-03-24 DIAGNOSIS — K219 Gastro-esophageal reflux disease without esophagitis: Secondary | ICD-10-CM | POA: Insufficient documentation

## 2014-03-24 DIAGNOSIS — I10 Essential (primary) hypertension: Secondary | ICD-10-CM

## 2014-03-24 DIAGNOSIS — E118 Type 2 diabetes mellitus with unspecified complications: Secondary | ICD-10-CM

## 2014-03-24 DIAGNOSIS — G4733 Obstructive sleep apnea (adult) (pediatric): Secondary | ICD-10-CM

## 2014-03-24 DIAGNOSIS — E1165 Type 2 diabetes mellitus with hyperglycemia: Secondary | ICD-10-CM

## 2014-03-24 DIAGNOSIS — I152 Hypertension secondary to endocrine disorders: Secondary | ICD-10-CM | POA: Insufficient documentation

## 2014-03-24 DIAGNOSIS — IMO0002 Reserved for concepts with insufficient information to code with codable children: Secondary | ICD-10-CM

## 2014-03-24 DIAGNOSIS — E1159 Type 2 diabetes mellitus with other circulatory complications: Secondary | ICD-10-CM | POA: Insufficient documentation

## 2014-03-24 HISTORY — DX: Morbid (severe) obesity due to excess calories: E66.01

## 2014-03-24 MED ORDER — OMEPRAZOLE 40 MG PO CPDR: 40.0000 mg | DELAYED_RELEASE_CAPSULE | Freq: Every day | ORAL | Status: DC

## 2014-03-24 NOTE — Assessment & Plan Note (Signed)
Elevated today - he did not take lisinopril last night. Will change lisinopril to QAM.

## 2014-03-24 NOTE — Assessment & Plan Note (Signed)
Endorses deterioration of heartburn over last several weeks. Will treat with omeprazole 40mg  daily for 3 wks then prn

## 2014-03-24 NOTE — Assessment & Plan Note (Signed)
Reviewed uncontrolled diabetes. Encouraged he more regularly check sugars. Pt not regular with medication regimen, discussed ways to improve compliance - he was missing doses because of concern of taking meds separately. rtc 1 mo for f/u, check labwork then.

## 2014-03-24 NOTE — Assessment & Plan Note (Addendum)
Describes typical symptoms of obstructive sleep apnea and has body habitus for this. Will expedite referral for sleep study. Pt concerned about falling asleep at work and losing job.

## 2014-03-24 NOTE — Progress Notes (Signed)
Pre visit review using our clinic review tool, if applicable. No additional management support is needed unless otherwise documented below in the visit note. 

## 2014-03-24 NOTE — Progress Notes (Signed)
BP 150/90 mmHg  Pulse 88  Temp(Src) 98.8 F (37.1 C) (Oral)  Ht  (1.803 m)  Wt 389 lb 8 oz (176.676 kg)  BMI 54.35 kg/m2   CC: transfer care  Subjective:    Patient ID: Alvin Phillips, male    DOB: 04-12-1980, 34 y.o.   MRN: [id number]  HPI: Alvin Phillips is a 34 y.o. male presenting on 03/24/2014 for Establish Care   [place] presents to establish care today - prior saw Raquel Ray.   Recent incarcerated umbilical hernia repaired by Dr Evette Cristal.    Out of work for last 5 days since Wednesday, planning on returning tomorrow.  Out of work 2/2 severe fatigue/exhaustion. Has been falling asleep at work. Worried about losing his job. Energy level decreasing, stays exhausted. Sleeping up to 10 hrs, doesn't awaken feeling rested. Endorses apnea per GF, significant snoring, + PNDyspnea. Was referred for sleep study 10/2013.   Bedtime 10pm, wakes up at 6am.   DM - compliant with glyburide  daily and metformin  bid. Checks sugars rarely - last checked several weeks ago. No hypoglycemic sxs. Denies paresthesias. Last eye exam - DUE. Pneumovax: declines.  Lab Results  Component Value Date   HGBA1C 8.7* 09/04/2013   Diabetic Foot Exam - Simple   Simple Foot Form  Diabetic Foot exam was performed with the following findings:  Yes 03/24/2014  5:36 PM  Visual Inspection  No deformities, no ulcerations, no other skin breakdown bilaterally:  Yes  Sensation Testing  Intact to touch and monofilament testing bilaterally:  Yes  Pulse Check  Posterior Tibialis and Dorsalis pulse intact bilaterally:  Yes  Comments      HTN - compliant with lisinopril  daily. No HA, vision changes, CP/tightness, leg swelling. Occasional dyspnea. bp elevated today - did not take meds.   Worsening reflux - constantly. Started nexium but this caused headaches. Interested in retrial of PPI.  Morbid obesity - interested in bariatric surgery referral.  Body mass index is 54.35 kg/(m^2).   Declines flu  shot Pneumovax today.  Relevant past medical, surgical, family and social history reviewed and updated as indicated. Interim medical history since our last visit reviewed. Allergies and medications reviewed and updated.  Current Outpatient Prescriptions on File Prior to Visit  Medication Sig  . glucose blood (BAYER CONTOUR NEXT TEST) test strip Use as instructed  . glyBURIDE (DIABETA) 5 MG tablet Take 1 tablet (5 mg total) by mouth daily with breakfast.  . Lancets (ONETOUCH ULTRASOFT) lancets Use as instructed  . lisinopril (PRINIVIL,ZESTRIL) 10 MG tablet Take 1 tablet (10 mg total) by mouth daily.  . metFORMIN (GLUCOPHAGE) 500 MG tablet Take 1 tablet (500 mg total) by mouth 2 (two) times daily with a meal.   No current facility-administered medications on file prior to visit.    Review of Systems Per HPI unless specifically indicated above     Objective:    BP 150/90 mmHg  Pulse 88  Temp(Src) 98.8 F (37.1 C) (Oral)  Ht  (1.803 m)  Wt 389 lb 8 oz (176.676 kg)  BMI 54.35 kg/m2  Wt Readings from Last 3 Encounters:  03/24/14 389 lb 8 oz (176.676 kg)  12/25/13 379 lb (171.913 kg)  11/26/13 382 lb (173.274 kg)    Physical Exam  Constitutional: He appears well-developed and well-nourished. No distress.  Morbidly obese  HENT:  Head: Normocephalic and atraumatic.  Right Ear: External ear normal.  Left Ear: External ear normal.  Nose:  Nose normal.  Mouth/Throat: Oropharynx is clear and moist. No oropharyngeal exudate.  Eyes: Conjunctivae and EOM are normal. Pupils are equal, round, and reactive to light. No scleral icterus.  Neck: Normal range of motion. Neck supple.  Cardiovascular: Normal rate, regular rhythm, normal heart sounds and intact distal pulses.   No murmur heard. Pulmonary/Chest: Effort normal and breath sounds normal. No respiratory distress. He has no wheezes. He has no rales.  Musculoskeletal: He exhibits no edema.  See HPI for foot exam if done    Lymphadenopathy:    He has no cervical adenopathy.  Skin: Skin is warm and dry. No rash noted.  Psychiatric: He has a normal mood and affect.  Nursing note and vitals reviewed.  Results for orders placed or performed in visit on 09/04/13  Lipid panel  Result Value Ref Range   Cholesterol 170 0 - 200 mg/dL   Triglycerides 263.7 (H) 0.0 - 149.0 mg/dL   HDL 85.88 (L) >50.27 mg/dL   VLDL 74.1 (H) 0.0 - 28.7 mg/dL   LDL Cholesterol 86 0 - 99 mg/dL   Total CHOL/HDL Ratio 6    NonHDL 141.60   Hemoglobin A1c  Result Value Ref Range   Hgb A1c MFr Bld 8.7 (H) 4.6 - 6.5 %      Assessment & Plan:  Filled out STD forms for work today as he has been out of work last 5 days, will await employer determination on whether this will be approved. Pt aware this may not be approved. Problem List Items Addressed This Visit    OSA (obstructive sleep apnea) - Primary    Describes typical symptoms of obstructive sleep apnea and has body habitus for this. Will expedite referral for sleep study. Pt concerned about falling asleep at work and losing job.    Relevant Orders      Ambulatory referral to Pulmonology   Morbidly obese    Discussed importance of weight loss for better control of multiple medical conditions - DM, HTN, OSA, plantar fasciitis. Pt states he has tried several different diets unsuccessfully. Asks about bariatric surgery Provided with [place] # for free bariatric seminar. Pt will also check with [facility] to see if seminar available there.    GERD (gastroesophageal reflux disease)    Endorses deterioration of heartburn over last several weeks. Will treat with omeprazole 40mg  daily for 3 wks then prn    Relevant Medications      omeprazole (PRILOSEC) capsule   Essential hypertension    Elevated today - he did not take lisinopril last night. Will change lisinopril to QAM.    Diabetes mellitus type 2, uncontrolled, with complications    Reviewed uncontrolled diabetes. Encouraged he  more regularly check sugars. Pt not regular with medication regimen, discussed ways to improve compliance - he was missing doses because of concern of taking meds separately. rtc 1 mo for f/u, check labwork then.        Follow up plan: Return in about 4 weeks (around 04/21/2014), or as needed, for follow up visit.

## 2014-03-24 NOTE — Assessment & Plan Note (Signed)
Discussed importance of weight loss for better control of multiple medical conditions - DM, HTN, OSA, plantar fasciitis. Pt states he has tried several different diets unsuccessfully. Asks about bariatric surgery Provided with  # for free bariatric seminar. Pt will also check with [facility] to see if seminar available there.

## 2014-03-24 NOTE — Patient Instructions (Addendum)
We will refer you for sleep study. If you haven't heard from [place] by next week give [place] a call.  Call [phone] for next free [place] weight loss seminar - or may check with [facility] to see if they have bariatric weight loss surgery seminar Please take medications regularly.  May take glyburide with first meal or with lunch but take daily. Same for lisinopril. May take all 3 meds in am at same time Take omeprazole 40mg  daily for 3 weeks then may stop or may just use as needed. This was sent to pharmacy I will fill out work forms and fax tomorrow. Return in 4 weeks for follow up.

## 2014-03-25 ENCOUNTER — Encounter: Payer: Self-pay | Admitting: Family Medicine

## 2014-03-31 ENCOUNTER — Encounter: Payer: Self-pay | Admitting: Family Medicine

## 2014-04-21 ENCOUNTER — Encounter: Payer: Self-pay | Admitting: Family Medicine

## 2014-04-21 ENCOUNTER — Ambulatory Visit (INDEPENDENT_AMBULATORY_CARE_PROVIDER_SITE_OTHER): Payer: BLUE CROSS/BLUE SHIELD | Admitting: Family Medicine

## 2014-04-21 VITALS — BP 124/84 | HR 68 | Temp 98.3°F | Wt 397.0 lb

## 2014-04-21 DIAGNOSIS — I10 Essential (primary) hypertension: Secondary | ICD-10-CM

## 2014-04-21 DIAGNOSIS — IMO0002 Reserved for concepts with insufficient information to code with codable children: Secondary | ICD-10-CM

## 2014-04-21 DIAGNOSIS — E1165 Type 2 diabetes mellitus with hyperglycemia: Secondary | ICD-10-CM

## 2014-04-21 DIAGNOSIS — G4733 Obstructive sleep apnea (adult) (pediatric): Secondary | ICD-10-CM

## 2014-04-21 DIAGNOSIS — K219 Gastro-esophageal reflux disease without esophagitis: Secondary | ICD-10-CM

## 2014-04-21 DIAGNOSIS — E118 Type 2 diabetes mellitus with unspecified complications: Secondary | ICD-10-CM

## 2014-04-21 MED ORDER — GLIMEPIRIDE 4 MG PO TABS: 4.0000 mg | ORAL_TABLET | Freq: Every day | ORAL | Status: DC

## 2014-04-21 MED ORDER — METFORMIN HCL ER (OSM) 1000 MG PO TB24: 1000.0000 mg | ORAL_TABLET | Freq: Every day | ORAL | Status: DC

## 2014-04-21 NOTE — Progress Notes (Signed)
Pre visit review using our clinic review tool, if applicable. No additional management support is needed unless otherwise documented below in the visit note. 

## 2014-04-21 NOTE — Assessment & Plan Note (Signed)
Discussed weight gain noted. Discussed emotional eating, encouraged take time to think about food choices each time he eats.

## 2014-04-21 NOTE — Assessment & Plan Note (Signed)
bp at goal with lisinopril on board. Continue.

## 2014-04-21 NOTE — Progress Notes (Signed)
BP 124/84 mmHg  Pulse 68  Temp(Src) 98.3 F (36.8 C) (Oral)  Wt 397 lb (180.078 kg)   CC: 1 mo f/u visit  Subjective:    Patient ID: Alvin Phillips, male    DOB: 1980/07/19, 34 y.o.   MRN: [id number]  HPI: Alvin Phillips is a 34 y.o. male presenting on 04/21/2014 for Follow-up   Morbid obesity - weight gain noted again. Endorses stress eating, emotional eating. Depression - ?of this. Stemming from obesity. Denies depressed mood, no anhedonia - breeds american bullies. Mainly some dysthymia.   HTN - Compliant with current antihypertensive regimen of lisinopril  daily.  Does not check blood pressures at home.  No low blood pressure readings or symptoms of dizziness/syncope.  Denies HA, vision changes, CP/tightness, SOB, leg swelling.    DM - regularly does not check sugars.  Has meter at home. Compliant with antihyperglycemic regimen which includes: metformin  bid, glyburide  daily. Sometimes misses PM metformin. Denies low sugars or hypoglycemic symptoms.  Denies paresthesias. Last diabetic eye exam DUE.  Pneumovax: today.  Prevnar: not due. Lab Results  Component Value Date   HGBA1C 8.7* 09/04/2013   Diabetic Foot Exam - Simple   No data filed      No regular exercise  Relevant past medical, surgical, family and social history reviewed and updated as indicated. Interim medical history since our last visit reviewed. Allergies and medications reviewed and updated. Current Outpatient Prescriptions on File Prior to Visit  Medication Sig  . glucose blood (BAYER CONTOUR NEXT TEST) test strip Use as instructed  . ibuprofen (ADVIL,MOTRIN) 200 MG tablet Take 200 mg by mouth as needed.  . Lancets (ONETOUCH ULTRASOFT) lancets Use as instructed  . lisinopril (PRINIVIL,ZESTRIL) 10 MG tablet Take 1 tablet (10 mg total) by mouth daily.  Marland Kitchen omeprazole (PRILOSEC) 40 MG capsule Take 1 capsule (40 mg total) by mouth daily. Take 30 min prior to meal   No current facility-administered  medications on file prior to visit.    Review of Systems Per HPI unless specifically indicated above     Objective:    BP 124/84 mmHg  Pulse 68  Temp(Src) 98.3 F (36.8 C) (Oral)  Wt 397 lb (180.078 kg)  Wt Readings from Last 3 Encounters:  04/21/14 397 lb (180.078 kg)  03/24/14 389 lb 8 oz (176.676 kg)  12/25/13 379 lb (171.913 kg)    Physical Exam  Constitutional: He appears well-developed and well-nourished. No distress.  Morbidly obese  HENT:  Head: Normocephalic and atraumatic.  Right Ear: External ear normal.  Left Ear: External ear normal.  Nose: Nose normal.  Mouth/Throat: Oropharynx is clear and moist. No oropharyngeal exudate.  Eyes: Conjunctivae and EOM are normal. Pupils are equal, round, and reactive to light. No scleral icterus.  Neck: Normal range of motion. Neck supple.  Cardiovascular: Normal rate, regular rhythm, normal heart sounds and intact distal pulses.   No murmur heard. Pulmonary/Chest: Effort normal and breath sounds normal. No respiratory distress. He has no wheezes. He has no rales.  Musculoskeletal: He exhibits edema (trace).  See HPI for foot exam if done  Lymphadenopathy:    He has no cervical adenopathy.  Skin: Skin is warm and dry. No rash noted.  Psychiatric: He has a normal mood and affect.  Nursing note and vitals reviewed.     Assessment & Plan:   Problem List Items Addressed This Visit    OSA (obstructive sleep apnea)    Has upcoming pulm  appt.      Morbidly obese    Discussed weight gain noted. Discussed emotional eating, encouraged take time to think about food choices each time he eats.      Relevant Medications   glimepiride (AMARYL) tablet   metformin (FORTAMET) 24 hr tablet   GERD (gastroesophageal reflux disease)    Never filled. Will fill.      Essential hypertension    bp at goal with lisinopril on board. Continue.      Diabetes mellitus type 2, uncontrolled, with complications - Primary    Reviewed meds -  pt has been more compliant with meds - but has not been checking cbgs.  He does have meter. Encouraged to start checking cbg's and encouraged to schedule eye exam as due. Requests once daily dosing of meds - will change to metformin ER 1000mg  and glimepiride 4mg  daily.      Relevant Medications   glimepiride (AMARYL) tablet   metformin (FORTAMET) 24 hr tablet   Other Relevant Orders   Ambulatory referral to diabetic education   Comprehensive metabolic panel   Hemoglobin A1c       Follow up plan: Return in about 3 months (around 07/20/2014), or as needed, for follow up visit.

## 2014-04-21 NOTE — Patient Instructions (Addendum)
Blood work today. We will call you with results and plan for sugar control. Get your eyes checked.  Try glimepiride 4mg  daily, metformin XR 1000mg  daily and continue lisinopril 10mg  daily. We will call you to schedule diabetes education at [facility].

## 2014-04-21 NOTE — Assessment & Plan Note (Signed)
Has upcoming pulm appt.

## 2014-04-21 NOTE — Assessment & Plan Note (Addendum)
Reviewed meds - pt has been more compliant with meds - but has not been checking cbgs.  He does have meter. Encouraged to start checking cbg's and encouraged to schedule eye exam as due. Requests once daily dosing of meds - will change to metformin ER 1000mg  and glimepiride 4mg  daily.

## 2014-04-21 NOTE — Assessment & Plan Note (Signed)
Never filled. Will fill.

## 2014-04-22 LAB — COMPREHENSIVE METABOLIC PANEL
ALK PHOS: 57 U/L (ref 39–117)
ALT: 20 U/L (ref 0–53)
AST: 16 U/L (ref 0–37)
Albumin: 4.3 g/dL (ref 3.5–5.2)
BUN: 12 mg/dL (ref 6–23)
CALCIUM: 9.7 mg/dL (ref 8.4–10.5)
CO2: 33 mEq/L — ABNORMAL HIGH (ref 19–32)
CREATININE: 0.86 mg/dL (ref 0.40–1.50)
Chloride: 100 mEq/L (ref 96–112)
GFR: 130.16 mL/min (ref >60.00)
GLUCOSE: 88 mg/dL (ref 70–99)
Potassium: 4 mEq/L (ref 3.5–5.1)
Sodium: 140 mEq/L (ref 135–145)
TOTAL PROTEIN: 8 g/dL (ref 6.0–8.3)
Total Bilirubin: 0.3 mg/dL (ref 0.2–1.2)

## 2014-04-22 LAB — HEMOGLOBIN A1C: HEMOGLOBIN A1C: 7.5 % — AB (ref 4.6–6.5)

## 2014-04-23 ENCOUNTER — Encounter: Payer: Self-pay | Admitting: *Deleted

## 2014-04-28 ENCOUNTER — Encounter: Payer: Self-pay | Admitting: Pulmonary Disease

## 2014-04-28 ENCOUNTER — Ambulatory Visit (INDEPENDENT_AMBULATORY_CARE_PROVIDER_SITE_OTHER): Payer: BLUE CROSS/BLUE SHIELD | Admitting: Pulmonary Disease

## 2014-04-28 VITALS — BP 130/84 | HR 80 | Ht 71.0 in | Wt 395.2 lb

## 2014-04-28 DIAGNOSIS — G4733 Obstructive sleep apnea (adult) (pediatric): Secondary | ICD-10-CM

## 2014-04-28 NOTE — Patient Instructions (Signed)
Home sleep study We will schedule CPAP therapy based on this test

## 2014-04-28 NOTE — Assessment & Plan Note (Signed)
Given excessive daytime somnolence, narrow pharyngeal exam, witnessed apneas & loud snoring, obstructive sleep apnea is very likely & an overnight polysomnogram will be scheduled as a home study. The pathophysiology of obstructive sleep apnea , it's cardiovascular consequences & modes of treatment including CPAP were discused with the patient in detail & they evidenced understanding.  

## 2014-04-28 NOTE — Progress Notes (Signed)
Subjective:    Patient ID: Alvin Phillips, male    DOB: 1980/03/27, 34 y.o.   MRN: [id number]  HPI   34 year Old morbidly obese man presents for evaluation of sleep-disordered breathing. He is accompanied by his girlfriend-old reports loud snoring and witnessed pauses in breathing during sleep. He reports non-refreshing sleep in spite of sleeping for 8 hours. He has occasionally woken himself up by snoring and gasping episodes. Epworth sleepiness score is 20. He works in Clinical biochemist for [employer] and reports increased drowsiness at work, to the point where he has had to take a leave of absence. He is afraid of getting fired because of sleepiness at work. He has fallen asleep at a stoplight. Bedtime is between 10 PM to 1 AM, sleep latency is minimal, he sleeps on his back or on his side with 2 pillows, reports multiple awakenings including nocturia, is out of bed by 7 AM with dryness of mouth, reports plantar fasciitis pain in the morning. He has gained about 50 pounds in the last 2 years  There is no history suggestive of cataplexy, sleep paralysis or parasomnias   Past Medical History  Diagnosis Date  . Diabetes mellitus without complication 07/10/13  . GERD (gastroesophageal reflux disease)   . Hypertension   . OSA (obstructive sleep apnea) 10/29/2013  . Morbidly obese 03/24/2014    Past Surgical History  Procedure Laterality Date  . Fracture surgery Left 2002    fractured arm  . Hernia repair  12-16-13    umbilical   No Known Allergies  History   Social History  . Marital Status: Single    Spouse Name: N/A    Number of Children: 2  . Years of Education: N/A   Occupational History  . Customer Care Representative     [employer]   Social History Main Topics  . Smoking status: Former Smoker -- 0.25 packs/day for 15 years    Types: Cigarettes    Quit date: 01/22/2013  . Smokeless tobacco: Never Used  . Alcohol Use: 0.0 oz/week    0 Not specified per week     Comment: Occassional beer, shots of liquor  . Drug Use: 2.00 per week    Special: Marijuana     Comment: 1 time per week  . Sexual Activity: Not on file   Other Topics Concern  . Not on file   Social History Narrative   Lives with girlfriend   Alvin Phillips grew up in [place]. He is currently living in [place]. He has a daughter and son Alvin Phillips, Alvin Phillips). He breeds dogs Medical sales representative).    Family History  Problem Relation Age of Onset  . Diabetes Maternal Grandmother   . Diabetes Paternal Grandfather      Review of Systems  Constitutional: Negative for fever and unexpected weight change.  HENT: Positive for congestion. Negative for dental problem, ear pain, nosebleeds, postnasal drip, rhinorrhea, sinus pressure, sneezing, sore throat and trouble swallowing.   Eyes: Negative for redness and itching.  Respiratory: Positive for apnea. Negative for cough, chest tightness and wheezing.   Cardiovascular: Negative for palpitations and leg swelling.  Gastrointestinal: Negative for nausea and vomiting.  Genitourinary: Negative for dysuria.  Musculoskeletal: Negative for joint swelling.  Skin: Negative for rash.  Neurological: Negative for headaches.  Hematological: Does not bruise/bleed easily.  Psychiatric/Behavioral: Negative for dysphoric mood. The patient is not nervous/anxious.        Objective:   Physical Exam  Gen. Pleasant, obese,  in no distress, normal affect ENT - no lesions, no post nasal drip, class 2-3 airway Neck: No JVD, no thyromegaly, no carotid bruits Lungs: no use of accessory muscles, no dullness to percussion, decreased without rales or rhonchi  Cardiovascular: Rhythm regular, heart sounds  normal, no murmurs or gallops, no peripheral edema Abdomen: soft and non-tender, no hepatosplenomegaly, BS normal. Musculoskeletal: No deformities, no cyanosis or clubbing Neuro:  alert, non focal, no tremors       Assessment & Plan:

## 2014-05-13 ENCOUNTER — Ambulatory Visit: Payer: Self-pay | Admitting: Family Medicine

## 2014-05-21 ENCOUNTER — Telehealth: Payer: Self-pay | Admitting: Family Medicine

## 2014-05-21 NOTE — Telephone Encounter (Signed)
In your IN box for completion.  

## 2014-05-21 NOTE — Telephone Encounter (Signed)
Pt dropped off re certification forms for fmla. Pt requests that you fill out and fax directly to the company at [phone].  If you have any questions, please call [phone]. Thanks

## 2014-05-23 DIAGNOSIS — Z7689 Persons encountering health services in other specified circumstances: Secondary | ICD-10-CM

## 2014-05-24 NOTE — Telephone Encounter (Signed)
Filled and in Kim's box. But please clarify with patient - he should have undergone overnight in home sleep study and followed up with pulm for CPAP treatment for sleep apnea. Once he starts treatment with CPAP should preclude need to stay out of work due to exhaustion - I will not continue approving FMLA for this after next 2 months. He needs to f/u with pulm between now and then.

## 2014-05-28 NOTE — Telephone Encounter (Signed)
Attempted to call patient. No answer and no voicemail at home or on cell. Will try again later. Form faxed.

## 2014-05-30 NOTE — Telephone Encounter (Signed)
Attempted to call patient. No answer or voicemail on home or cell. Will try again later.

## 2014-06-04 ENCOUNTER — Encounter: Payer: Self-pay | Admitting: *Deleted

## 2014-06-04 NOTE — Telephone Encounter (Signed)
Attempted to call again. No answer, no machine. Letter mailed.

## 2014-06-25 ENCOUNTER — Encounter: Payer: Self-pay | Admitting: Family Medicine

## 2014-07-12 NOTE — Op Note (Signed)
PATIENT NAME:  Alvin Phillips, Alvin Phillips MR#:  [id number] DATE OF BIRTH:  Mar 11, 1981  DATE OF PROCEDURE:  12/16/2013   PREOPERATIVE DIAGNOSIS: Incarcerated umbilical hernia.   POSTOPERATIVE DIAGNOSIS: Incarcerated umbilical hernia.   OPERATION: Open repair of incarcerated umbilical hernia.   SURGEON: Kathreen Cosier, MD  ANESTHESIA: General.   COMPLICATIONS: None.   ESTIMATED BLOOD LOSS: Minimal.   DRAINS: None.   DESCRIPTION OF PROCEDURE: The patient was put to sleep in the supine position on the operating room table. The abdomen was prepped and draped out as a sterile field. Timeout was performed.   A curved incision along the superior margin of the umbilicus was made about 2 inches in length. It was then carefully deepened through the subcutaneous tissue until the hernial protrusion was identified. This was circumferentially freed with the use of cautery down to the fascial opening which was only about a centimeter size. The entire portion of the hernia contained pretty much incarcerated fatty tissue that a very hard feel in the middle, flush with the fascia, and this was amputated and ligated x 2 with 2-0 Vicryl, and the remaining portion reduced beneath the fascia. The fascial opening was then delineated and 2 figure-of-eight stitches of 0 Prolene were used to close this over. The wound was irrigated and the subcutaneous tissue and deeper layers closed with 2-0 Vicryl. Skin closed with subcuticular 4-0 Vicryl, covered with Dermabond.   The procedure was well tolerated. The patient was subsequently extubated and returned to the recovery room in stable condition.    ____________________________ S.Wynona Luna, MD sgs:MT D: 12/16/2013 08:46:10 ET T: 12/16/2013 09:20:55 ET JOB#: [id number]  cc: Timoteo Expose. Evette Cristal, MD, <Dictator> [facility] Wynona Luna MD ELECTRONICALLY SIGNED 12/16/2013 14:06

## 2014-07-21 ENCOUNTER — Other Ambulatory Visit: Payer: Self-pay | Admitting: *Deleted

## 2014-07-21 ENCOUNTER — Ambulatory Visit: Payer: BLUE CROSS/BLUE SHIELD | Admitting: Family Medicine

## 2014-07-29 ENCOUNTER — Ambulatory Visit (INDEPENDENT_AMBULATORY_CARE_PROVIDER_SITE_OTHER): Payer: BLUE CROSS/BLUE SHIELD | Admitting: Family Medicine

## 2014-07-29 ENCOUNTER — Encounter (INDEPENDENT_AMBULATORY_CARE_PROVIDER_SITE_OTHER): Payer: Self-pay

## 2014-07-29 ENCOUNTER — Encounter: Payer: Self-pay | Admitting: Family Medicine

## 2014-07-29 ENCOUNTER — Telehealth: Payer: Self-pay | Admitting: Family Medicine

## 2014-07-29 VITALS — BP 132/96 | HR 68 | Temp 97.8°F | Wt 388.2 lb

## 2014-07-29 DIAGNOSIS — G4733 Obstructive sleep apnea (adult) (pediatric): Secondary | ICD-10-CM | POA: Diagnosis not present

## 2014-07-29 DIAGNOSIS — K219 Gastro-esophageal reflux disease without esophagitis: Secondary | ICD-10-CM | POA: Diagnosis not present

## 2014-07-29 DIAGNOSIS — I10 Essential (primary) hypertension: Secondary | ICD-10-CM

## 2014-07-29 DIAGNOSIS — IMO0002 Reserved for concepts with insufficient information to code with codable children: Secondary | ICD-10-CM

## 2014-07-29 DIAGNOSIS — E118 Type 2 diabetes mellitus with unspecified complications: Secondary | ICD-10-CM | POA: Diagnosis not present

## 2014-07-29 DIAGNOSIS — E1165 Type 2 diabetes mellitus with hyperglycemia: Secondary | ICD-10-CM

## 2014-07-29 LAB — BASIC METABOLIC PANEL
BUN: 16 mg/dL (ref 6–23)
CALCIUM: 9.6 mg/dL (ref 8.4–10.5)
CO2: 32 mEq/L (ref 19–32)
Chloride: 101 mEq/L (ref 96–112)
Creatinine, Ser: 0.98 mg/dL (ref 0.40–1.50)
GFR: 111.77 mL/min (ref >60.00)
Glucose, Bld: 108 mg/dL — ABNORMAL HIGH (ref 70–99)
POTASSIUM: 4.2 meq/L (ref 3.5–5.1)
SODIUM: 137 meq/L (ref 135–145)

## 2014-07-29 LAB — MICROALBUMIN / CREATININE URINE RATIO
Creatinine,U: 169.6 mg/dL
MICROALB UR: 4.1 mg/dL — AB (ref 0.0–1.9)
Microalb Creat Ratio: 2.4 mg/g (ref 0.0–30.0)

## 2014-07-29 LAB — HEMOGLOBIN A1C: HEMOGLOBIN A1C: 7 % — AB (ref 4.6–6.5)

## 2014-07-29 LAB — LDL CHOLESTEROL, DIRECT: LDL DIRECT: 88 mg/dL

## 2014-07-29 MED ORDER — OMEPRAZOLE 40 MG PO CPDR: 40.0000 mg | DELAYED_RELEASE_CAPSULE | Freq: Every day | ORAL | Status: DC

## 2014-07-29 MED ORDER — LISINOPRIL 10 MG PO TABS: 10.0000 mg | ORAL_TABLET | Freq: Every day | ORAL | Status: DC

## 2014-07-29 NOTE — Assessment & Plan Note (Signed)
Stable on PRN PPI 

## 2014-07-29 NOTE — Patient Instructions (Addendum)
Blood work today. Continue medicines as up to now. I've refilled lisinopril. Return in 75months for follow up. Keep appointment for in home sleep test. I will review FMLA forms

## 2014-07-29 NOTE — Progress Notes (Signed)
Pre visit review using our clinic review tool, if applicable. No additional management support is needed unless otherwise documented below in the visit note. 

## 2014-07-29 NOTE — Assessment & Plan Note (Signed)
Mildly elevated today but pt has not been taking lisinopril - it was denied by [place] station office. I have refilled today.

## 2014-07-29 NOTE — Assessment & Plan Note (Addendum)
Chronic, does not check sugars. Continue regimen. Check A1c today. Foot exam today.

## 2014-07-29 NOTE — Assessment & Plan Note (Signed)
Pt has not undergone at home sleep study over last 3 months, but requests I continue filling out FMLA forms. Discussed I cannot continue doing this if he does not follow medical recommendations for further evaluation. I will fill out pending FMLA forms when they arrive for doctor appointments only. Discussed with patient.

## 2014-07-29 NOTE — Progress Notes (Signed)
BP 132/96 mmHg  Pulse 68  Temp(Src) 97.8 F (36.6 C) (Oral)  Wt 388 lb 4 oz (176.109 kg)   CC: f/u visit  Subjective:    Patient ID: Alvin Phillips, male    DOB: 1980-07-01, 34 y.o.   MRN: [id number]  HPI: Alvin Phillips is a 34 y.o. male presenting on 07/29/2014 for Follow-up   Here for 3 mo f/u visit.  Obesity - congratulated on 7 lb weight loss. Trouble with night time snacking - ie fruity pebbles and ice cream.  GERD - omeprazole  daily prn helpful.   OSA - still has not scheduled appt. Pending in home   HTN - ran out of lisinopril  daily. Does not check blood pressures at home. No low blood pressure readings or symptoms of dizziness/syncope. Denies HA, vision changes, CP/tightness, SOB, leg swelling.   DM - regularly does not check sugars.Has meter at home. Did not keep appt with DSME. Compliant with antihyperglycemic regimen which includes: last visit we changed to ER metformin  daily, and changed to glimepiride  daily. He was having trouble remembering 2nd dose of BID dosed meds. Denies low sugars or hypoglycemic symptoms. Denies paresthesias. Last diabetic eye exam DUE. Pneumovax: today. Prevnar: not due. Lab Results  Component Value Date   HGBA1C 7.5* 04/21/2014    Diabetic Foot Exam - Simple   No data filed       Relevant past medical, surgical, family and social history reviewed and updated as indicated. Interim medical history since our last visit reviewed. Allergies and medications reviewed and updated. Current Outpatient Prescriptions on File Prior to Visit  Medication Sig  . glimepiride (AMARYL) 4 MG tablet Take 1 tablet (4 mg total) by mouth daily before breakfast.  . glucose blood (BAYER CONTOUR NEXT TEST) test strip Use as instructed  . ibuprofen (ADVIL,MOTRIN) 200 MG tablet Take 200 mg by mouth as needed.  . Lancets (ONETOUCH ULTRASOFT) lancets Use as instructed  . metformin (FORTAMET) 1000 MG (OSM) 24 hr tablet Take 1 tablet (1,000  mg total) by mouth daily with breakfast.   No current facility-administered medications on file prior to visit.    Review of Systems Per HPI unless specifically indicated above     Objective:    BP 132/96 mmHg  Pulse 68  Temp(Src) 97.8 F (36.6 C) (Oral)  Wt 388 lb 4 oz (176.109 kg)  Wt Readings from Last 3 Encounters:  07/29/14 388 lb 4 oz (176.109 kg)  04/28/14 395 lb 3.2 oz (179.262 kg)  04/21/14 397 lb (180.078 kg)    Physical Exam  Constitutional: He appears well-developed and well-nourished. No distress.  Morbidly obese Body mass index is 54.17 kg/(m^2).   HENT:  Head: Normocephalic and atraumatic.  Right Ear: External ear normal.  Left Ear: External ear normal.  Nose: Nose normal.  Mouth/Throat: Oropharynx is clear and moist. No oropharyngeal exudate.  Eyes: Conjunctivae and EOM are normal. Pupils are equal, round, and reactive to light. No scleral icterus.  Neck: Normal range of motion. Neck supple.  Cardiovascular: Normal rate, regular rhythm, normal heart sounds and intact distal pulses.   No murmur heard. Pulmonary/Chest: Effort normal and breath sounds normal. No respiratory distress. He has no wheezes. He has no rales.  Musculoskeletal: He exhibits no edema.  See HPI for foot exam if done  Lymphadenopathy:    He has no cervical adenopathy.  Skin: Skin is warm and dry. No rash noted.  Psychiatric: He has a normal mood  and affect.  Nursing note and vitals reviewed.  Results for orders placed or performed in visit on 04/21/14  Comprehensive metabolic panel  Result Value Ref Range   Sodium 140 135 - 145 mEq/L   Potassium 4.0 3.5 - 5.1 mEq/L   Chloride 100 96 - 112 mEq/L   CO2 33 (H) 19 - 32 mEq/L   Glucose, Bld 88 70 - 99 mg/dL   BUN 12 6 - 23 mg/dL   Creatinine, Ser 0.08 0.40 - 1.50 mg/dL   Total Bilirubin 0.3 0.2 - 1.2 mg/dL   Alkaline Phosphatase 57 39 - 117 U/L   AST 16 0 - 37 U/L   ALT 20 0 - 53 U/L   Total Protein 8.0 6.0 - 8.3 g/dL   Albumin  4.3 3.5 - 5.2 g/dL   Calcium 9.7 8.4 - 67.6 mg/dL   GFR 195.09 >32.67 mL/min  Hemoglobin A1c  Result Value Ref Range   Hgb A1c MFr Bld 7.5 (H) 4.6 - 6.5 %      Assessment & Plan:   Problem List Items Addressed This Visit    OSA (obstructive sleep apnea) - Primary    Pt has not undergone at home sleep study over last 3 months, but requests I continue filling out FMLA forms. Discussed I cannot continue doing this if he does not follow medical recommendations for further evaluation. I will fill out pending FMLA forms when they arrive for doctor appointments only. Discussed with patient.      Morbidly obese    Discussed healthy diet changes to affect sustainable weight loss. Pt may be intersted in bariatric medications - consider phentermine.      GERD (gastroesophageal reflux disease)    Stable on PRN PPI.      Relevant Medications   omeprazole (PRILOSEC) 40 MG capsule   Essential hypertension    Mildly elevated today but pt has not been taking lisinopril - it was denied by [place] office. I have refilled today.      Relevant Medications   lisinopril (PRINIVIL,ZESTRIL) 10 MG tablet   Diabetes mellitus type 2, uncontrolled, with complications    Chronic, does not check sugars. Continue regimen. Check A1c today. Foot exam today.      Relevant Medications   lisinopril (PRINIVIL,ZESTRIL) 10 MG tablet   Other Relevant Orders   Hemoglobin A1c   Basic metabolic panel   LDL Cholesterol, Direct   Microalbumin / creatinine urine ratio       Follow up plan: Return in about 3 months (around 10/29/2014), or as needed, for follow up visit.

## 2014-07-29 NOTE — Telephone Encounter (Signed)
FMLA paperwork in Dr Reece Agar In Box For review and signature

## 2014-07-29 NOTE — Assessment & Plan Note (Signed)
Discussed healthy diet changes to affect sustainable weight loss. Pt may be intersted in bariatric medications - consider phentermine.

## 2014-07-29 NOTE — Telephone Encounter (Signed)
Received flma paperwork by fax

## 2014-07-30 ENCOUNTER — Ambulatory Visit: Payer: BLUE CROSS/BLUE SHIELD | Admitting: Family Medicine

## 2014-07-30 ENCOUNTER — Telehealth: Payer: Self-pay | Admitting: Family Medicine

## 2014-07-30 ENCOUNTER — Telehealth: Payer: Self-pay | Admitting: Pulmonary Disease

## 2014-07-30 DIAGNOSIS — G4730 Sleep apnea, unspecified: Secondary | ICD-10-CM | POA: Diagnosis not present

## 2014-07-30 DIAGNOSIS — G4733 Obstructive sleep apnea (adult) (pediatric): Secondary | ICD-10-CM

## 2014-07-30 DIAGNOSIS — Z0289 Encounter for other administrative examinations: Secondary | ICD-10-CM

## 2014-07-30 NOTE — Telephone Encounter (Signed)
This will need to go to another doctor in the office today. It is Dr. Timoteo Expose half-day and he is already gone. I'm not sure when he will see this.

## 2014-07-30 NOTE — Telephone Encounter (Signed)
Pt does not have appt scheduled until 10/31/14 with Dr Sharen Hones.

## 2014-07-30 NOTE — Telephone Encounter (Signed)
HST- severe OSA - stopped breathing 90/h Suggest CPAP titration study

## 2014-07-30 NOTE — Telephone Encounter (Signed)
Paperwork faxed to Marolyn Hammock  @ [phone] Copy for pt  Copy for file Copy to scan

## 2014-07-30 NOTE — Telephone Encounter (Signed)
PLEASE NOTE: All timestamps contained within this report are represented as [place] Standard Time. CONFIDENTIALTY NOTICE: This fax transmission is intended only for the addressee. It contains information that is legally privileged, confidential or otherwise protected from use or disclosure. If you are not the intended recipient, you are strictly prohibited from reviewing, disclosing, copying using or disseminating any of this information or taking any action in reliance on or regarding this information. If you have received this fax in error, please notify us immediately by telephone so that we can arrange for its return to [place]. Phone: [phone], Toll-Free: [phone], Fax: [phone] Page: 1 of 2 Call Id: [id number] [facility] Day - Client TELEPHONE ADVICE RECORD [facility] Patient Name: Alvin Phillips Gender: Male DOB: 1980-08-03 Age: 34 Y 3 M 4 D Return Phone Number: [phone] (Primary) [place] [address] Client [facility] Day - Client Client Site [facility] - Day Physician Eustaquio Boyden Contact Type Call Call Type Triage / Clinical Relationship To Patient Self Appointment Disposition EMR Appointment Not Necessary Info pasted into Epic Yes Return Phone Number [phone] (Primary) Chief Complaint Swelling (generalized) Initial Comment Caller states he has just completed a sleep study. Ball in back of throat above tongue seems swollen. PreDisposition Call Doctor Nurse Assessment Nurse: Sherilyn Cooter, RN, [name] Date/Time [name] Time): 07/30/2014 12:32:00 PM Confirm and document reason for call. If symptomatic, describe symptoms. ---Caller states that his uvula in his mouth is swollen. It was swollen to where it was touching his tongue. He has been drinking ice water and holding ice in his mouth to help with the swelling. It has gone down some. He is having some  difficulty swallowing but no difficulty breathing. Denies fever. He denies pain. Has the patient traveled out of the country within the last 30 days? ---No Does the patient require triage? ---Yes Related visit to physician within the last 2 weeks? ---No Does the PT have any chronic conditions? (i.e. diabetes, asthma, etc.) ---Yes List chronic conditions. ---Diabetes Guidelines Guideline Title Affirmed Question Affirmed Notes Nurse Date/Time [name] Time) Uvula Swelling Swollen uvula Sherilyn Cooter, RN, [name] 07/30/2014 12:34:38 PM Disp. Time [name] Time) Disposition Final User 07/30/2014 12:40:57 PM Go to ED Now Yes Sherilyn Cooter, RN, [name] Understands: Yes PLEASE NOTE: All timestamps contained within this report are represented as [place] Standard Time. CONFIDENTIALTY NOTICE: This fax transmission is intended only for the addressee. It contains information that is legally privileged, confidential or otherwise protected from use or disclosure. If you are not the intended recipient, you are strictly prohibited from reviewing, disclosing, copying using or disseminating any of this information or taking any action in reliance on or regarding this information. If you have received this fax in error, please notify us immediately by telephone so that we can arrange for its return to [place]. Phone: [phone], Toll-Free: [phone], Fax: [phone] Page: 2 of 2 Call Id: [id number] Disagree/Comply: Disagree Disagree/Comply Reason: Disagree with instructions Care Advice Given Per Guideline GO TO ED NOW: You need to be seen in the Emergency Department. Go to the ER at ___________ Hospital. Leave now. Drive carefully. * Please bring a list of your current medicines when you go to the Emergency Department (ER). * Difficulty breathing occurs * Can't swallow normal secretions (e.g., drooling or spitting) After Care Instructions Given Call Event Type User Date / Time Description Comments User: Ronney Asters,  RN Date/Time [name] Time): 07/30/2014 12:43:04 PM Caller declines to go to  ER at present. He states that with the ice, the swelling is going down some. He states that if it gets that swollen again, he will go to ER as recommended. He has an appointment coming up soon with his PCP and he will see him at that time. Advised him that the doctor's office will probably call him to discuss this with him. He verbalized understanding. Referrals GO TO FACILITY REFUSED

## 2014-07-30 NOTE — Telephone Encounter (Signed)
Voicemail has not been activated.  Will call back tomorrow.

## 2014-07-30 NOTE — Telephone Encounter (Signed)
Patient Name: Alvin Phillips… [facility]  DOB: [birth year]    Initial Comment Caller states he has just completed a sleep study. Ball in back of throat above tongue seems swollen.    Nurse Assessment  Nurse: Sherilyn Cooter, RN, [name] Date/Time [name] Time): 07/30/2014 12:32:00 PM  Confirm and document reason for call. If symptomatic, describe symptoms. ---Caller states that his uvula in his mouth is swollen. It was swollen to where it was touching his tongue. He has been drinking ice water and holding ice in his mouth to help with the swelling. It has gone down some. He is having some difficulty swallowing but no difficulty breathing. Denies fever. He denies pain.  Has the patient traveled out of the country within the last 30 days? ---No  Does the patient require triage? ---Yes  Related visit to physician within the last 2 weeks? ---No  Does the PT have any chronic conditions? (i.e. diabetes, asthma, etc.) ---Yes  List chronic conditions. ---Diabetes     Guidelines    Guideline Title Affirmed Question Affirmed Notes  Uvula Swelling Swollen uvula    Final Disposition User   Go to ED Now Sherilyn Cooter, RN, [name]    Comments  Caller declines to go to ER at present. He states that with the ice, the swelling is going down some. He states that if it gets that swollen again, he will go to ER as recommended. He has an appointment coming up soon with his PCP and he will see him at that time. Advised him that the doctor's office will probably call him to discuss this with him. He verbalized understanding.

## 2014-07-30 NOTE — Telephone Encounter (Signed)
Needs immediate attn- ER or UC  If trouble breathing- call EMS

## 2014-07-30 NOTE — Telephone Encounter (Signed)
Kim CMA advised not sure if Dr Sharen Hones will be on computer this afternoon so will also send to Dr Milinda Antis who is in office.

## 2014-07-30 NOTE — Telephone Encounter (Signed)
Will see him then 

## 2014-07-30 NOTE — Telephone Encounter (Signed)
Pt declined to go to UC or ER, but wanted to be seen here if possible. appt scheduled with Dr. Milinda Antis today at 4:45pm

## 2014-07-30 NOTE — Telephone Encounter (Signed)
Pt aware fmla paperwork He will call to give fax # to fax

## 2014-07-30 NOTE — Telephone Encounter (Signed)
Filled and in my outbox

## 2014-07-30 NOTE — Telephone Encounter (Signed)
Pt called in regarding his new fmla. Pt states that it says that he can only work 4 hours twice a month.  Previously it has said that he had the option to miss up to 4 hours per day or 1 day per week.  Please call pt call on mobile phone thanks.

## 2014-07-31 ENCOUNTER — Encounter: Payer: Self-pay | Admitting: *Deleted

## 2014-07-31 ENCOUNTER — Encounter: Payer: Self-pay | Admitting: Family Medicine

## 2014-07-31 ENCOUNTER — Ambulatory Visit (INDEPENDENT_AMBULATORY_CARE_PROVIDER_SITE_OTHER): Payer: BLUE CROSS/BLUE SHIELD | Admitting: Family Medicine

## 2014-07-31 VITALS — BP 122/92 | HR 89 | Temp 98.0°F | Wt 388.5 lb

## 2014-07-31 DIAGNOSIS — Z6841 Body Mass Index (BMI) 40.0 and over, adult: Secondary | ICD-10-CM

## 2014-07-31 DIAGNOSIS — K122 Cellulitis and abscess of mouth: Secondary | ICD-10-CM

## 2014-07-31 DIAGNOSIS — E118 Type 2 diabetes mellitus with unspecified complications: Secondary | ICD-10-CM

## 2014-07-31 DIAGNOSIS — G4733 Obstructive sleep apnea (adult) (pediatric): Secondary | ICD-10-CM

## 2014-07-31 DIAGNOSIS — IMO0002 Reserved for concepts with insufficient information to code with codable children: Secondary | ICD-10-CM

## 2014-07-31 DIAGNOSIS — E1165 Type 2 diabetes mellitus with hyperglycemia: Secondary | ICD-10-CM

## 2014-07-31 DIAGNOSIS — J029 Acute pharyngitis, unspecified: Secondary | ICD-10-CM | POA: Diagnosis not present

## 2014-07-31 DIAGNOSIS — G4730 Sleep apnea, unspecified: Secondary | ICD-10-CM | POA: Diagnosis not present

## 2014-07-31 LAB — POCT RAPID STREP A (OFFICE): Rapid Strep A Screen: POSITIVE — AB

## 2014-07-31 MED ORDER — PHENTERMINE HCL 30 MG PO CAPS: 30.0000 mg | ORAL_CAPSULE | ORAL | Status: DC

## 2014-07-31 MED ORDER — AMOXICILLIN 875 MG PO TABS: 875.0000 mg | ORAL_TABLET | Freq: Two times a day (BID) | ORAL | Status: DC

## 2014-07-31 NOTE — Telephone Encounter (Signed)
That is correct. We discussed this at his office visit.  I wrote new FMLA in order for him to keep his medical appointments as needed up to twice a month.  Should start treatment for OSA and not need to be out any longer for OSA related absences.

## 2014-07-31 NOTE — Telephone Encounter (Signed)
Spoke with patient. He said that just because he has had the sleep study done, doesn't mean that he has answers yet and is still missing work due to issues. He also thinks he is having an allergic reaction since having the study done. He said he was about to go to the ER in a little bit like he was advised to do yesterday by team health but he didn't have a ride. I advised that you had an opening today and could see him for that and to answer his questions about the FMLA. I went ahead and scheduled him so the appt wouldn't get taken and he was going to check with his ride to see if they could bring him and call me back.

## 2014-07-31 NOTE — Telephone Encounter (Signed)
Just confirming- he is only supposed to work 4 hours twice a month or miss 4 hours twice a month? Original message says he can only work 4 hours twice a month which you said was correct.

## 2014-07-31 NOTE — Assessment & Plan Note (Signed)
Advised he call pulm to discuss CPAP titration study.

## 2014-07-31 NOTE — Assessment & Plan Note (Addendum)
RST faintly positive Treat with amoxicillin course.  Continue benadryl prn, continue ice chips. Also discussed possible lisinopril etiology - although less likely as pt has not taken med in last 2 weeks.  Red flags to seek ER care discussed.  If not improving with abx, low threshold to stop ACEI.

## 2014-07-31 NOTE — Telephone Encounter (Signed)
Only may miss two 4 hour periods per month to keep medical office visits.

## 2014-07-31 NOTE — Progress Notes (Signed)
Pre visit review using our clinic review tool, if applicable. No additional management support is needed unless otherwise documented below in the visit note. 

## 2014-07-31 NOTE — Assessment & Plan Note (Signed)
Congratulated on marked improvement in glycemic control. Continue amaryl and metformin ER (OSM) 1000mg  daily.

## 2014-07-31 NOTE — Patient Instructions (Addendum)
Have HR fax back the form that they have a question about.  For uvular swelling - strep test positive - treat with amoxicillin antibiotic. Continue benadryl and ice chips. Let us know if not improving as expected. If worsening swelling or any trouble breathing please seek ER care.   Uvulitis Uvulitis is redness and soreness (inflammation) of the uvula. The uvula is the small tongue-shaped piece of tissue in the back of your mouth.  CAUSES Infection is a common cause of uvulitis. Infection of the uvula can be either viral or bacterial. Infectious uvulitis usually only occurs in association with another condition, such as inflammation and infection of the mouth or throat. Other causes of uvulitis include:  Trauma to the uvula.  Swelling from excess fluid buildup (edema), which may be an allergic reaction.  Inhalation of irritants, such as chemical agents, smoke, or steam. DIAGNOSIS Your caregiver can usually diagnose uvulitis through a physical examination. Bacterial uvulitis can be diagnosed through the results of the growth of samples of bodily substances taken from your mouth (cultures). HOME CARE INSTRUCTIONS   Rest as much as possible.  Young children may suck on frozen juice bars or frozen ice pops. Older children and adults may gargle with a warm or cold liquid to help soothe the throat. (Mix  tsp of salt in 8 oz of water, or use strong tea.)  Use a cool-mist humidifier to lessen throat irritation and cough.  Drink enough fluids to keep your urine clear or pale yellow.  While the throat is very sore, eat soft or liquid foods such as milk, ice cream, soups, or milk drinks.  Family members who develop a sore throat or fever should have a medical exam or throat culture.  If your child has uvulitis and is taking antibiotic medicine, wait 24 hours or until his or her temperature is near normal (less than 100 F [37.8 C]) before allowing him or her to return to school or day  care.  Only take over-the-counter or prescription medicines for pain, discomfort, or fever as directed by your caregiver. Ask when your test results will be ready. Make sure you get your test results. SEEK MEDICAL CARE IF:   You have an oral temperature above 102 F (38.9 C).  You develop large, tender lumps your the neck.  Your child develops a rash.  You cough up green, yellow-brown, or bloody substances. SEEK IMMEDIATE MEDICAL CARE IF:   You develop any new symptoms, such as vomiting, earache, severe headache, stiff neck, chest pain, or trouble breathing or swallowing.  Your airway is blocked.  You develop more severe throat pain along with drooling or voice changes. Document Released: 10/16/2003 Document Revised: 05/30/2011 Document Reviewed: 05/13/2010 [facility] Patient Information 2015 [place], [place]. This information is not intended to replace advice given to you by your health care provider. Make sure you discuss any questions you have with your health care provider.

## 2014-07-31 NOTE — Progress Notes (Signed)
BP 122/92 mmHg  Pulse 89  Temp(Src) 98 F (36.7 C) (Oral)  Wt 388 lb 8 oz (176.222 kg)  SpO2 97%   CC: ?allergic reaction  Subjective:    Patient ID: Alvin Phillips, male    DOB: 03-16-1981, 34 y.o.   MRN: [id number]  HPI: Alvin Phillips is a 34 y.o. male presenting on 07/31/2014 for Follow-up   ?allergic reaction with uvular swelling. Called team health - advised to go to ER and declined, again advised to go to ER by Dr Milinda Antis - pt declined. He scheduled appt yesterday with Dr Milinda Antis and did not keep that appointment.  Presents today for evaluation.   This started Wednesday morning. Denies tongue or posterior throat swelling. No rash, no dyspnea. No new medicines, supplements, foods or drinks. Slight scratchy throat.   No h/o allergies, no PNDrainage.  Used ice to decrease swelling and took two benadryl last night  He is on lisinopril  daily but has not been taking regularly for the past 2 weeks (ran out). Will refill tomorrow.  Interested in bariatric medication - denies chest pain, insomnia.  Relevant past medical, surgical, family and social history reviewed and updated as indicated. Interim medical history since our last visit reviewed. Allergies and medications reviewed and updated. Current Outpatient Prescriptions on File Prior to Visit  Medication Sig  . glimepiride (AMARYL) 4 MG tablet Take 1 tablet (4 mg total) by mouth daily before breakfast.  . glucose blood (BAYER CONTOUR NEXT TEST) test strip Use as instructed  . ibuprofen (ADVIL,MOTRIN) 200 MG tablet Take 200 mg by mouth as needed.  . Lancets (ONETOUCH ULTRASOFT) lancets Use as instructed  . lisinopril (PRINIVIL,ZESTRIL) 10 MG tablet Take 1 tablet (10 mg total) by mouth daily.  . metformin (FORTAMET) 1000 MG (OSM) 24 hr tablet Take 1 tablet (1,000 mg total) by mouth daily with breakfast.  . omeprazole (PRILOSEC) 40 MG capsule Take 1 capsule (40 mg total) by mouth daily. Take 30 min prior to meal   No current  facility-administered medications on file prior to visit.    Review of Systems Per HPI unless specifically indicated above     Objective:    BP 122/92 mmHg  Pulse 89  Temp(Src) 98 F (36.7 C) (Oral)  Wt 388 lb 8 oz (176.222 kg)  SpO2 97%  Wt Readings from Last 3 Encounters:  07/31/14 388 lb 8 oz (176.222 kg)  07/29/14 388 lb 4 oz (176.109 kg)  04/28/14 395 lb 3.2 oz (179.262 kg)   Body mass index is 54.21 kg/(m^2).  Physical Exam  Constitutional: He appears well-developed and well-nourished. No distress.  HENT:  Head: Normocephalic.  Right Ear: Hearing, tympanic membrane and ear canal normal.  Left Ear: Hearing, tympanic membrane and ear canal normal.  Nose: No mucosal edema or rhinorrhea.  Mouth/Throat: Uvula is midline, oropharynx is clear and moist and mucous membranes are normal. No oropharyngeal exudate, posterior oropharyngeal edema, posterior oropharyngeal erythema or tonsillar abscesses.  Mild uvular erythema and swelling PNdrainage present  Eyes: Conjunctivae and EOM are normal. Pupils are equal, round, and reactive to light. No scleral icterus.  Neck: Normal range of motion. Neck supple.  Lymphadenopathy:    He has no cervical adenopathy.  Skin: Skin is warm and dry. No rash noted.  Nursing note and vitals reviewed.  Results for orders placed or performed in visit on 07/31/14  POCT rapid strep A  Result Value Ref Range   Rapid Strep A Screen Positive (A)  Negative      Assessment & Plan:   Problem List Items Addressed This Visit    Diabetes mellitus type 2, uncontrolled, with complications    Congratulated on marked improvement in glycemic control. Continue amaryl and metformin ER (OSM) 1000mg  daily.      Morbid obesity with BMI of 50.0-59.9, adult    Discussed phentermine including risks of medication to include elevated blood pressure, heart rate, headache, chest pain.  Pt interested in this medication, start 30mg  daily prn appetite suppression. Pt  agrees with plan.      Relevant Medications   phentermine 30 MG capsule   OSA (obstructive sleep apnea)    Advised he call pulm to discuss CPAP titration study.      Uvulitis - Primary    RST faintly positive Treat with amoxicillin course.  Continue benadryl prn, continue ice chips. Also discussed possible lisinopril etiology - although less likely as pt has not taken med in last 2 weeks.  Red flags to seek ER care discussed.  If not improving with abx, low threshold to stop ACEI.       Other Visit Diagnoses    Sore throat        Relevant Orders    POCT rapid strep A (Completed)        Follow up plan: Return if symptoms worsen or fail to improve.

## 2014-07-31 NOTE — Assessment & Plan Note (Signed)
Discussed phentermine including risks of medication to include elevated blood pressure, heart rate, headache, chest pain.  Pt interested in this medication, start 30mg  daily prn appetite suppression. Pt agrees with plan.

## 2014-08-01 NOTE — Telephone Encounter (Signed)
Pt is aware of results. Order has been placed for CPAP titration. 

## 2014-08-01 NOTE — Telephone Encounter (Signed)
Pt returned call [phone]

## 2014-08-04 ENCOUNTER — Other Ambulatory Visit: Payer: Self-pay | Admitting: *Deleted

## 2014-08-04 ENCOUNTER — Telehealth: Payer: Self-pay | Admitting: *Deleted

## 2014-08-04 DIAGNOSIS — G4733 Obstructive sleep apnea (adult) (pediatric): Secondary | ICD-10-CM

## 2014-08-04 NOTE — Telephone Encounter (Signed)
Patient left a voice mail stating that he needs a note for being out of work. Patient stated that he was seen last week and needs the note also for Friday and Saturday for strep throat.. Patient is at work now and will not be able to take a call until after he finishes work.

## 2014-08-04 NOTE — Telephone Encounter (Signed)
Pt came by with date that he missed work needs note for work Put triage notes in Dr Reece Agar IN BOX

## 2014-08-04 NOTE — Telephone Encounter (Signed)
Pt seen 5/12 for uvulitis. Ok to write for letter as below. plz have pt have HR fax [place] form they had questions about.

## 2014-08-06 ENCOUNTER — Telehealth: Payer: Self-pay | Admitting: Family Medicine

## 2014-08-06 NOTE — Telephone Encounter (Signed)
I just realized FMLA paperwork was filled out wrong - I've updated it, plz fax addended form to his work - in [employer]

## 2014-08-06 NOTE — Telephone Encounter (Signed)
FMLA forms faxed. Attempted to call and cancel appt for tomorrow since work note has been updated as well. No answer and no option to leave voice mail. Will try again later.

## 2014-08-06 NOTE — Telephone Encounter (Signed)
Spoke with patient.

## 2014-08-06 NOTE — Telephone Encounter (Signed)
Pt returned your call. Please call back, thanks °

## 2014-08-06 NOTE — Telephone Encounter (Signed)
Spoke with patient and advised that work note was ready to be picked up. Advised I would cancel appt for tomorrow unless he just wanted to come in. He said he would pick up the note and asked to cancel the appt. Appt cancelled and note placed up front for pick up.

## 2014-08-06 NOTE — Telephone Encounter (Signed)
Letter written and in Kim[employer] box. 

## 2014-08-06 NOTE — Telephone Encounter (Signed)
Pt called back stating the letter he pick up didn't have all the dates on it that he needed. Pt made appointment for 5/19 to discuss work note

## 2014-08-07 ENCOUNTER — Ambulatory Visit: Payer: BLUE CROSS/BLUE SHIELD | Admitting: Family Medicine

## 2014-08-25 ENCOUNTER — Ambulatory Visit (HOSPITAL_BASED_OUTPATIENT_CLINIC_OR_DEPARTMENT_OTHER): Payer: BLUE CROSS/BLUE SHIELD | Attending: Pulmonary Disease

## 2014-08-25 VITALS — Ht 72.0 in | Wt 375.0 lb

## 2014-08-25 DIAGNOSIS — G4733 Obstructive sleep apnea (adult) (pediatric): Secondary | ICD-10-CM | POA: Diagnosis not present

## 2014-08-25 DIAGNOSIS — R0683 Snoring: Secondary | ICD-10-CM | POA: Insufficient documentation

## 2014-08-28 ENCOUNTER — Telehealth: Payer: Self-pay | Admitting: Pulmonary Disease

## 2014-08-28 DIAGNOSIS — G4730 Sleep apnea, unspecified: Secondary | ICD-10-CM | POA: Diagnosis not present

## 2014-08-28 DIAGNOSIS — G4733 Obstructive sleep apnea (adult) (pediatric): Secondary | ICD-10-CM

## 2014-08-28 NOTE — Telephone Encounter (Signed)
Please send prescription to DME  CPAP 16 cm, medium fullface mask, humidity, download in 4 weeks Please arrange office visit in 6 weeks with download

## 2014-08-28 NOTE — Sleep Study (Signed)
[facility]  NAME: Alvin Phillips  DATE OF BIRTH: 12/10/1980  MEDICAL RECORD NUMBER [id number]  LOCATION: [facility]  PHYSICIAN: ALVA,RAKESH V.  DATE OF STUDY: 08/25/14   SLEEP STUDY TYPE: CPAP titration study               REFERRING PHYSICIAN: Oretha Milch, MD  INDICATION FOR STUDY: 34 year old morbidly obese man with severe OSA. Home study showed AHI of 90 per hour. At the time of this study ,they weighed 375 pounds with a height of  6 ft 0 inches and the BMI of 51, neck size of 19 inches. Epworth sleepiness score was 24   This CPAP titration polysomnogram was performed with a sleep technologist in attendance. EEG, EOG,EMG and respiratory parameters recorded. Sleep stages, arousals, limb movements and respiratory data was scored according to criteria laid out by the American Academy of sleep medicine.  SLEEP ARCHITECTURE: Lights out was at 2303 PM and lights on was at 511 AM. Total sleep time was 303 minutes with sleep period time of 363 minutes and sleep efficiency of 82% .Sleep latency was 4 minutes with latency to REM sleep of 70 minutes and wake after sleep onset of 60 minutes.  Sleep stages as a percentage of total sleep time was N1 12% N2- 71 % and REM sleep 17 % ( 50 minutes) . The longest period of REM sleep was around 1 AM.   AROUSAL DATA : There were 18 arousals with an arousal index of 3.6 events per hour. Of these 14 were spontaneous, and 4 were associated with respiratory events and 0 were associated periodic limb movements  RESPIRATORY DATA: CPAP was initiated at 5 centimeters and titrated to a final level of 16 centimeters due to respiratory events and snoring. At the final level of 16 centimeters, there were 0 obstructive apneas, 0 central apneas, 0 mixed apneas and 0 hypopneas with apnea -hypopnea index of 0 events per hour.  There was no relation to sleep stage or body position. Titration was optimal.  MOVEMENT/PARASOMNIA: There  were 0 PLMS with a PLM index of 0 events per hour. The PLM arousal index was 0 events per hour.  OXYGEN DATA: The lowest desaturation was 92 % during non-REM sleep and the desaturation index was 6 per hour. The saturations stayed below 88% for 0 minutes.  CARDIAC DATA: The low heart rate was 43 beats per minute. The high heart rate recorded was an artifact. No arrhythmias were noted   IMPRESSION :  1. Severe obstructive sleep apnea with hypopneas causing sleep fragmentation and severe oxygen desaturation. 2. This was corrected by CPAP of 16 centimeters with a medium fullface mask. Titration was optimal. 3. No evidence of cardiac arrhythmias or behavioral disturbance during sleep. 4. Periodic limb movements were not noted.  RECOMMENDATION:    1. The treatment options for this degree of sleep disordered breathing includes weight loss and/or CPAP therapy. CPAP can be initiated at 16 centimeters with a medium fullface mask and compliance monitored at this level. 2. Patient should be cautioned against driving when sleepy 3. They should be asked to avoid medications with sedative side effects  Oretha Milch  MD Diplomate, American Board of Sleep Medicine  ELECTRONICALLY SIGNED ON:  08/28/2014  [facility] PH: [phone]   FX: [phone] ACCREDITED BY THE AMERICAN ACADEMY OF SLEEP MEDICINE

## 2014-09-01 NOTE — Telephone Encounter (Signed)
Could not leave message.  Will call back.  Order entered for CPAP

## 2014-09-02 NOTE — Telephone Encounter (Signed)
Patient notified.  Patient scheduled for follow up visit. Nothing further needed.

## 2014-10-02 ENCOUNTER — Telehealth: Payer: Self-pay | Admitting: Pulmonary Disease

## 2014-10-02 ENCOUNTER — Encounter: Payer: Self-pay | Admitting: Family Medicine

## 2014-10-02 NOTE — Telephone Encounter (Signed)
Noted. Will route to RA to make him aware. 

## 2014-10-22 ENCOUNTER — Ambulatory Visit: Payer: BLUE CROSS/BLUE SHIELD | Admitting: Pulmonary Disease

## 2014-10-31 ENCOUNTER — Ambulatory Visit: Payer: BLUE CROSS/BLUE SHIELD | Admitting: Family Medicine

## 2014-11-09 ENCOUNTER — Telehealth: Payer: Self-pay | Admitting: Family Medicine

## 2014-11-09 NOTE — Telephone Encounter (Signed)
Looks like patient missed last pulm appt for f/u OSA. plz call to ensure he has next appt scheduled - important to f/u with pulm for OSA and to use CPAP.

## 2014-11-10 NOTE — Telephone Encounter (Signed)
Tried to contact patient at numbers listed and they have been disconnected. Unable to leave a message at work number. Tried to leave a message on mother's voicemail ([facility]) and mail box was full. Will have to try again later.

## 2014-11-11 NOTE — Telephone Encounter (Signed)
All phone numbers listed have been disconnected.

## 2014-11-11 NOTE — Telephone Encounter (Signed)
All phone numbers listed have been disconnected. Will try again later to be sure. If unable to reach, I will mail letter to patient letting him know that his phone numbers need to be updated for our records.

## 2014-11-28 ENCOUNTER — Encounter: Payer: Self-pay | Admitting: *Deleted

## 2014-11-28 NOTE — Telephone Encounter (Signed)
Letter mailed notifying patient. 

## 2015-03-26 ENCOUNTER — Other Ambulatory Visit: Payer: Self-pay | Admitting: Family Medicine

## 2015-08-13 ENCOUNTER — Other Ambulatory Visit: Payer: Self-pay | Admitting: Family Medicine

## 2016-01-30 ENCOUNTER — Other Ambulatory Visit: Payer: Self-pay | Admitting: Family Medicine

## 2016-02-16 ENCOUNTER — Telehealth: Payer: Self-pay | Admitting: Family Medicine

## 2016-02-16 NOTE — Telephone Encounter (Signed)
Pt called to report that his insurance no longer covers metformin.  He needs a refill.  He wants to know if there is another drug he can take instead of the metformin.  Can you please call him to discuss.

## 2016-02-18 MED ORDER — METFORMIN HCL ER 500 MG PO TB24: 1000.0000 mg | ORAL_TABLET | Freq: Every day | ORAL | 3 refills | Status: DC

## 2016-02-18 NOTE — Telephone Encounter (Signed)
Have sent in new metformin XR to price out. Let us know if unaffordable.

## 2016-02-24 ENCOUNTER — Encounter: Payer: Self-pay | Admitting: Family Medicine

## 2016-02-24 ENCOUNTER — Ambulatory Visit (INDEPENDENT_AMBULATORY_CARE_PROVIDER_SITE_OTHER): Payer: BLUE CROSS/BLUE SHIELD | Admitting: Family Medicine

## 2016-02-24 VITALS — BP 120/90 | HR 90 | Wt >= 6400 oz

## 2016-02-24 DIAGNOSIS — E118 Type 2 diabetes mellitus with unspecified complications: Secondary | ICD-10-CM

## 2016-02-24 DIAGNOSIS — R0789 Other chest pain: Secondary | ICD-10-CM | POA: Diagnosis not present

## 2016-02-24 DIAGNOSIS — E1165 Type 2 diabetes mellitus with hyperglycemia: Secondary | ICD-10-CM

## 2016-02-24 DIAGNOSIS — M722 Plantar fascial fibromatosis: Secondary | ICD-10-CM

## 2016-02-24 DIAGNOSIS — Z6841 Body Mass Index (BMI) 40.0 and over, adult: Secondary | ICD-10-CM

## 2016-02-24 DIAGNOSIS — IMO0002 Reserved for concepts with insufficient information to code with codable children: Secondary | ICD-10-CM

## 2016-02-24 DIAGNOSIS — I10 Essential (primary) hypertension: Secondary | ICD-10-CM | POA: Diagnosis not present

## 2016-02-24 LAB — COMPREHENSIVE METABOLIC PANEL
ALT: 21 U/L (ref 0–53)
AST: 18 U/L (ref 0–37)
Albumin: 4.3 g/dL (ref 3.5–5.2)
Alkaline Phosphatase: 72 U/L (ref 39–117)
BUN: 12 mg/dL (ref 6–23)
CALCIUM: 9.4 mg/dL (ref 8.4–10.5)
CHLORIDE: 97 meq/L (ref 96–112)
CO2: 33 meq/L — AB (ref 19–32)
CREATININE: 0.89 mg/dL (ref 0.40–1.50)
GFR: 123.81 mL/min (ref >60.00)
Glucose, Bld: 227 mg/dL — ABNORMAL HIGH (ref 70–99)
POTASSIUM: 3.8 meq/L (ref 3.5–5.1)
SODIUM: 137 meq/L (ref 135–145)
Total Bilirubin: 0.3 mg/dL (ref 0.2–1.2)
Total Protein: 7.8 g/dL (ref 6.0–8.3)

## 2016-02-24 LAB — CBC
HEMATOCRIT: 37.5 % — AB (ref 39.0–52.0)
Hemoglobin: 12.6 g/dL — ABNORMAL LOW (ref 13.0–17.0)
MCHC: 33.5 g/dL (ref 30.0–36.0)
MCV: 87.4 fl (ref 78.0–100.0)
PLATELETS: 331 10*3/uL (ref 150.0–400.0)
RBC: 4.3 Mil/uL (ref 4.22–5.81)
RDW: 13.9 % (ref 11.5–15.5)
WBC: 7.8 10*3/uL (ref 4.0–10.5)

## 2016-02-24 LAB — HEMOGLOBIN A1C: HEMOGLOBIN A1C: 9 % — AB (ref 4.6–6.5)

## 2016-02-24 LAB — MICROALBUMIN / CREATININE URINE RATIO
Creatinine,U: 269.3 mg/dL
MICROALB/CREAT RATIO: 11 mg/g (ref 0.0–30.0)
Microalb, Ur: 29.6 mg/dL — ABNORMAL HIGH (ref 0.0–1.9)

## 2016-02-24 MED ORDER — LISINOPRIL 10 MG PO TABS: 10.0000 mg | ORAL_TABLET | Freq: Every day | ORAL | 1 refills | Status: DC

## 2016-02-24 MED ORDER — METFORMIN HCL 1000 MG PO TABS: ORAL_TABLET | ORAL | 1 refills | Status: DC

## 2016-02-24 NOTE — Patient Instructions (Signed)
Please make an appointment to follow up in 3 months See Shirlee LimerickMarion for referrals Try to exercise 3-4 times a week- start slow  Diabetes Mellitus and Food It is important for you to manage your blood sugar (glucose) level. Your blood glucose level can be greatly affected by what you eat. Eating healthier foods in the appropriate amounts throughout the day at about the same time each day will help you control your blood glucose level. It can also help slow or prevent worsening of your diabetes mellitus. Healthy eating may even help you improve the level of your blood pressure and reach or maintain a healthy weight. General recommendations for healthful eating and cooking habits include:  Eating meals and snacks regularly. Avoid going long periods of time without eating to lose weight.  Eating a diet that consists mainly of plant-based foods, such as fruits, vegetables, nuts, legumes, and whole grains.  Using low-heat cooking methods, such as baking, instead of high-heat cooking methods, such as deep frying. Work with your dietitian to make sure you understand how to use the Nutrition Facts information on food labels. How can food affect me? Carbohydrates  Carbohydrates affect your blood glucose level more than any other type of food. Your dietitian will help you determine how many carbohydrates to eat at each meal and teach you how to count carbohydrates. Counting carbohydrates is important to keep your blood glucose at a healthy level, especially if you are using insulin or taking certain medicines for diabetes mellitus. Alcohol  Alcohol can cause sudden decreases in blood glucose (hypoglycemia), especially if you use insulin or take certain medicines for diabetes mellitus. Hypoglycemia can be a life-threatening condition. Symptoms of hypoglycemia (sleepiness, dizziness, and disorientation) are similar to symptoms of having too much alcohol. If your health care provider has given you approval to drink  alcohol, do so in moderation and use the following guidelines:  Women should not have more than one drink per day, and men should not have more than two drinks per day. One drink is equal to:  12 oz of beer.  5 oz of wine.  1 oz of hard liquor.  Do not drink on an empty stomach.  Keep yourself hydrated. Have water, diet soda, or unsweetened iced tea.  Regular soda, juice, and other mixers might contain a lot of carbohydrates and should be counted. What foods are not recommended? As you make food choices, it is important to remember that all foods are not the same. Some foods have fewer nutrients per serving than other foods, even though they might have the same number of calories or carbohydrates. It is difficult to get your body what it needs when you eat foods with fewer nutrients. Examples of foods that you should avoid that are high in calories and carbohydrates but low in nutrients include:  Trans fats (most processed foods list trans fats on the Nutrition Facts label).  Regular soda.  Juice.  Candy.  Sweets, such as cake, pie, doughnuts, and cookies.  Fried foods. What foods can I eat? Eat nutrient-rich foods, which will nourish your body and keep you healthy. The food you should eat also will depend on several factors, including:  The calories you need.  The medicines you take.  Your weight.  Your blood glucose level.  Your blood pressure level.  Your cholesterol level. You should eat a variety of foods, including:  Protein.  Lean cuts of meat.  Proteins low in saturated fats, such as fish, egg whites, and  beans. Avoid processed meats.  Fruits and vegetables.  Fruits and vegetables that may help control blood glucose levels, such as apples, mangoes, and yams.  Dairy products.  Choose fat-free or low-fat dairy products, such as milk, yogurt, and cheese.  Grains, bread, pasta, and rice.  Choose whole grain products, such as multigrain bread, whole  oats, and brown rice. These foods may help control blood pressure.  Fats.  Foods containing healthful fats, such as nuts, avocado, olive oil, canola oil, and fish. Does everyone with diabetes mellitus have the same meal plan? Because every person with diabetes mellitus is different, there is not one meal plan that works for everyone. It is very important that you meet with a dietitian who will help you create a meal plan that is just right for you. This information is not intended to replace advice given to you by your health care provider. Make sure you discuss any questions you have with your health care provider. Document Released: 12/02/2004 Document Revised: 08/13/2015 Document Reviewed: 02/01/2013 Elsevier Interactive Patient Education  2017 [employer].

## 2016-02-24 NOTE — Progress Notes (Signed)
Subjective:    Patient ID: Alvin Phillips, male    DOB: 01/25/1981, 35 y.o.   MRN: [id number]  HPI This is a 35 yo male who presents today with headache, right sided chest pain, decreased energy. He has known HTN, DM, morbid obesity. He has not been compliant with follow up and medication. Was temporarily uninsured. Now has a stable job with insurance. He works as a Merchandiser, retailsupervisor in a call center.   Headache- generalized, more frontal over left eye, recent, intermittent. Relief with percocet- had left over from dental procedure. No visual changes, no blurred vision or double vision. High stress level- related to work. Works long hours.   Chest pain- on right side, below breast, 6/10, resolves spontaneously, may be related to overeating, can last all day, has frequent heart burn which is more frequent lately. Eats late after working 10 hours. No pain with exertion, no radiation, no nausea/vomiting, no diaphoresis.   Diabetes- not currently taking any medication. Going to bathroom every hour. Eats biscuit sandwich from [employer], coffee with 10 sugars, orders out sandwich for lunch, fast food most nights for dinner, girlfriend cooks 2-3 nights a week. Tolerated metformin. Has gym membership but has not been exercising. Has glucometer at home, has never used, is afraid of needles.   Bilateral foot pain- has history of plantar fascitis and has pain in heels with walking.   Bloody ejaculation- had in past, full urologic work up including cystoscopy and DRE, didn't notice for several months. Has noticed recently that he has blood in ejaculation with masturbation but not with intercourse.   Past Medical History:  Diagnosis Date  . Diabetes mellitus without complication (HCC) 07/10/13   referred to DSME, did not show (04/2014)  . GERD (gastroesophageal reflux disease)   . Hypertension   . Morbidly obese (HCC) 03/24/2014  . OSA (obstructive sleep apnea) 10/29/2013   Past Surgical History:  Procedure  Laterality Date  . FRACTURE SURGERY Left 2002   fractured arm  . HERNIA REPAIR  12-16-13   umbilical   Family History  Problem Relation Age of Onset  . Diabetes Maternal Grandmother   . Diabetes Paternal Grandfather    Social History  Substance Use Topics  . Smoking status: Former Smoker    Packs/day: 0.25    Years: 15.00    Types: Cigarettes    Quit date: 01/22/2013  . Smokeless tobacco: Never Used  . Alcohol use 0.0 oz/week     Comment: Occassional beer, shots of liquor      Review of Systems Per HPI    Objective:   Physical Exam  Constitutional: He is oriented to person, place, and time. He appears well-developed and well-nourished.  Morbidly obese.   HENT:  Head: Normocephalic and atraumatic.  Mouth/Throat: Oropharynx is clear and moist.  Eyes: Conjunctivae are normal.  Neck: Normal range of motion. Neck supple.  Cardiovascular: Normal rate, regular rhythm and normal heart sounds.   Pulmonary/Chest: Effort normal and breath sounds normal. He exhibits tenderness (anterior chest wall tender to palpation).  Musculoskeletal: He exhibits edema (trace pretibial).  Bilateral feet with heel tenderness, no lesions, normal color, warm and dry.   Neurological: He is alert and oriented to person, place, and time.  Skin: Skin is warm and dry.  Increased pigmentation of neck.    Psychiatric: He has a normal mood and affect. His behavior is normal. Judgment and thought content normal.  Vitals reviewed.     BP 120/90   Pulse 90  Wt (!) 403 lb (182.8 kg)   SpO2 97%   BMI 54.66 kg/m  Wt Readings from Last 3 Encounters:  02/24/16 (!) 403 lb (182.8 kg)  08/25/14 (!) 375 lb (170.1 kg)  07/31/14 (!) 388 lb 8 oz (176.2 kg)       Assessment & Plan:  1. Uncontrolled type 2 diabetes mellitus with complication, without long-term current use of insulin (HCC) - discussed complications of uncontrolled diabetes, patient states he is ready to attend diabetic education and make  changes  - POCT glucose (manual entry) - CBC - Comprehensive metabolic panel - Hemoglobin A1c - metFORMIN (GLUCOPHAGE) 1000 MG tablet; Take 1/2 tablet a day for 3 days then 1/2 tablet a day twice a day for 1 week then 1 tablet twice a day.  Dispense: 180 tablet; Refill: 1 - lisinopril (PRINIVIL,ZESTRIL) 10 MG tablet; Take 1 tablet (10 mg total) by mouth daily.  Dispense: 90 tablet; Refill: 1 - Ambulatory referral to diabetic education - Ambulatory referral to General Surgery - Microalbumin / creatinine urine ratio  2. Morbid obesity with BMI of 50.0-59.9, adult [facility]) - he is interested in bariatric surgery, provided referral - Ambulatory referral to diabetic education - Ambulatory referral to General Surgery  3. Essential hypertension - CBC - Comprehensive metabolic panel - Ambulatory referral to General Surgery  4. Chest wall pain - tylenol, heat prn - RTC/ER if develops pain with exertion or pain that radiates or is accompanied by nausea/vomiting/ SOB  5. Plantar fasciitis, bilateral - encouraged stretching twice a day, especially prior to getting out of bed in the morning - can try otc heel cups  - follow up in 3 months with PCP Olean Ree, FNP-BC  [facility], [facility]  02/27/2016 10:09 PM

## 2016-02-24 NOTE — Telephone Encounter (Signed)
In for visit today  

## 2016-03-02 ENCOUNTER — Encounter: Payer: Self-pay | Admitting: *Deleted

## 2016-05-02 ENCOUNTER — Emergency Department
Admission: EM | Admit: 2016-05-02 | Discharge: 2016-05-02 | Disposition: A | Discharge status: Home / Self Care | Payer: BLUE CROSS/BLUE SHIELD | Attending: Emergency Medicine | Admitting: Emergency Medicine

## 2016-05-02 ENCOUNTER — Encounter: Payer: Self-pay | Admitting: *Deleted

## 2016-05-02 ENCOUNTER — Emergency Department: Payer: BLUE CROSS/BLUE SHIELD

## 2016-05-02 DIAGNOSIS — I10 Essential (primary) hypertension: Secondary | ICD-10-CM | POA: Insufficient documentation

## 2016-05-02 DIAGNOSIS — R0789 Other chest pain: Secondary | ICD-10-CM | POA: Diagnosis not present

## 2016-05-02 DIAGNOSIS — Z79899 Other long term (current) drug therapy: Secondary | ICD-10-CM | POA: Diagnosis not present

## 2016-05-02 DIAGNOSIS — R109 Unspecified abdominal pain: Secondary | ICD-10-CM | POA: Insufficient documentation

## 2016-05-02 DIAGNOSIS — R110 Nausea: Secondary | ICD-10-CM | POA: Diagnosis not present

## 2016-05-02 DIAGNOSIS — R079 Chest pain, unspecified: Secondary | ICD-10-CM

## 2016-05-02 DIAGNOSIS — Z7984 Long term (current) use of oral hypoglycemic drugs: Secondary | ICD-10-CM | POA: Diagnosis not present

## 2016-05-02 DIAGNOSIS — E119 Type 2 diabetes mellitus without complications: Secondary | ICD-10-CM | POA: Diagnosis not present

## 2016-05-02 DIAGNOSIS — Z87891 Personal history of nicotine dependence: Secondary | ICD-10-CM | POA: Insufficient documentation

## 2016-05-02 LAB — GLUCOSE, CAPILLARY: GLUCOSE-CAPILLARY: 275 mg/dL — AB (ref 65–99)

## 2016-05-02 MED ORDER — PANTOPRAZOLE SODIUM 20 MG PO TBEC: 20.0000 mg | DELAYED_RELEASE_TABLET | Freq: Every day | ORAL | 1 refills | Status: DC

## 2016-05-02 NOTE — ED Notes (Addendum)
PT is refusing blood work in triage. Pt states "I think it is just GERD I don't want to have the blood drawn" PT also refusing the Chest Xray at this time. Radiology notified.

## 2016-05-02 NOTE — Discharge Instructions (Signed)
Please seek medical attention for any high fevers, chest pain, shortness of breath, change in behavior, persistent vomiting, bloody stool or any other new or concerning symptoms.  

## 2016-05-02 NOTE — ED Provider Notes (Signed)
[facility] Emergency Department Provider Note   ____________________________________________   I have reviewed the triage vital signs and the nursing notes.   HISTORY  Chief Complaint Chest Pain and Gastroesophageal Reflux   History limited by: Not Limited   HPI Alvin Phillips is a 36 y.o. male who presents to the emergency department today because of concerns for chest and upper abdominal pain. This been going on for the past week. It is intermittent. He states that he gets a sensation shortly after he eats. He has also felt nauseous although has not had any vomiting. The patient has been taking Tums without any relief. No fevers. The patient is diabetic and has not checked his sugar for the past week. He states he does feel like he is urinating more.   Past Medical History:  Diagnosis Date  . Diabetes mellitus without complication (HCC) 07/10/13   referred to DSME, did not show (04/2014)  . GERD (gastroesophageal reflux disease)   . Hypertension   . Morbidly obese (HCC) 03/24/2014  . OSA (obstructive sleep apnea) 10/29/2013    Patient Active Problem List   Diagnosis Date Noted  . Uvulitis 07/31/2014  . Morbid obesity with BMI of 50.0-59.9, adult (HCC) 03/24/2014  . GERD (gastroesophageal reflux disease) 03/24/2014  . Essential hypertension 03/24/2014  . OSA (obstructive sleep apnea) 10/29/2013  . Plantar fasciitis, bilateral 10/29/2013  . Diabetes mellitus type 2, uncontrolled, with complications (HCC) 08/27/2013  . Microalbuminuria 08/27/2013  . Microscopic hematuria 07/22/2013    Past Surgical History:  Procedure Laterality Date  . FRACTURE SURGERY Left 2002   fractured arm  . HERNIA REPAIR  12-16-13   umbilical    Prior to Admission medications   Medication Sig Start Date End Date Taking? Authorizing Provider  glimepiride (AMARYL) 4 MG tablet TAKE ONE TABLET BY MOUTH ONCE DAILY BEFORE BREAKFAST Patient not taking: Reported on 02/24/2016  03/26/15   Eustaquio Boyden, MD  glucose blood (BAYER CONTOUR NEXT TEST) test strip Use as instructed 07/22/13   Raquel Conni Elliot, NP  ibuprofen (ADVIL,MOTRIN) 200 MG tablet Take 200 mg by mouth as needed.    Historical Provider, MD  Lancets [facility] ULTRASOFT) lancets Use as instructed 07/22/13   Raquel Conni Elliot, NP  lisinopril (PRINIVIL,ZESTRIL) 10 MG tablet Take 1 tablet (10 mg total) by mouth daily. 02/24/16   Emi Belfast, FNP  metFORMIN (GLUCOPHAGE) 1000 MG tablet Take 1/2 tablet a day for 3 days then 1/2 tablet a day twice a day for 1 week then 1 tablet twice a day. 02/24/16   Emi Belfast, FNP  omeprazole (PRILOSEC) 40 MG capsule Take 1 capsule (40 mg total) by mouth daily. Take 30 min prior to meal 07/29/14   Eustaquio Boyden, MD    Allergies Patient has no known allergies.  Family History  Problem Relation Age of Onset  . Diabetes Maternal Grandmother   . Diabetes Paternal Grandfather     Social History Social History  Substance Use Topics  . Smoking status: Former Smoker    Packs/day: 0.25    Years: 15.00    Types: Cigarettes    Quit date: 01/22/2013  . Smokeless tobacco: Never Used  . Alcohol use 0.0 oz/week     Comment: Occassional beer, shots of liquor    Review of Systems  Constitutional: Negative for fever. Cardiovascular: Positive for center chest pain. Respiratory: Negative for shortness of breath. Gastrointestinal: Positive for abdominal pain and nausea Genitourinary: Negative for dysuria. Musculoskeletal: Negative for  back pain. Skin: Negative for rash. Neurological: Negative for headaches, focal weakness or numbness.  10-point ROS otherwise negative.  ____________________________________________   PHYSICAL EXAM:  VITAL SIGNS: ED Triage Vitals  Enc Vitals Group     BP 05/02/16 1641 (!) 163/97     Pulse Rate 05/02/16 1641 (!) 101     Resp 05/02/16 1641 16     Temp 05/02/16 1641 98.8 F (37.1 C)     Temp Source 05/02/16 1641 Oral     SpO2 05/02/16  1641 98 %     Weight 05/02/16 1642 (!) 400 lb (181.4 kg)     Height 05/02/16 1642 5\' 11"  (1.803 m)     Head Circumference --      Peak Flow --      Pain Score 05/02/16 1642 6    Constitutional: Alert and oriented. Well appearing and in no distress. Eyes: Conjunctivae are normal. Normal extraocular movements. ENT   Head: Normocephalic and atraumatic.   Nose: No congestion/rhinnorhea.   Mouth/Throat: Mucous membranes are moist.   Neck: No stridor. Hematological/Lymphatic/Immunilogical: No cervical lymphadenopathy. Cardiovascular: Normal rate, regular rhythm.  No murmurs, rubs, or gallops.  Respiratory: Normal respiratory effort without tachypnea nor retractions. Breath sounds are clear and equal bilaterally. No wheezes/rales/rhonchi. Gastrointestinal: Soft and non tender. No rebound. No guarding.  Genitourinary: Deferred Musculoskeletal: Normal range of motion in all extremities. No lower extremity edema. Neurologic:  Normal speech and language. No gross focal neurologic deficits are appreciated.  Skin:  Skin is warm, dry and intact. No rash noted. Psychiatric: Mood and affect are normal. Speech and behavior are normal. Patient exhibits appropriate insight and judgment.  ____________________________________________    LABS (pertinent positives/negatives)  Labs Reviewed  GLUCOSE, CAPILLARY - Abnormal; Notable for the following:       Result Value   Glucose-Capillary 275 (*)    All other components within normal limits  BASIC METABOLIC PANEL  CBC  TROPONIN I    ____________________________________________   EKG  I, Phineas Semen, attending physician, personally viewed and interpreted this EKG  EKG Time: 1649 Rate: 96 Rhythm: normal sinus rhythm Axis: normal Intervals: qtc 459 QRS: delta waves present ST changes: no st elevation Impression: abnormal ekg   ____________________________________________     RADIOLOGY  None  ____________________________________________   PROCEDURES  Procedures  ____________________________________________   INITIAL IMPRESSION / ASSESSMENT AND PLAN / ED COURSE  Pertinent labs & imaging results that were available during my care of the patient were reviewed by me and considered in my medical decision making (see chart for details).  Patient resented to the emergency department today because of concerns for chest pain and concern for possible GERD. Patient's EKG did show delta waves consistent with WPW. I discussed this with the patient. He states he has no knowledge of having any history of this. I discussed with patient importance of [place] up with his primary care and seen cardiology. In terms of the chest pain patient refused blood work and chest x-ray. I did discuss with patient that that severely limits my ability to diagnose his problem. However that being said it does sound like his problem is more related to the GI tract than the heart. Patient states he will make an appointment to see his PCP tomorrow.   ____________________________________________   FINAL CLINICAL IMPRESSION(S) / ED DIAGNOSES  Final diagnoses:  Nonspecific chest pain     Note: This dictation was prepared with Dragon dictation. Any transcriptional errors that result from this process are  unintentional     Phineas Semen, MD 05/02/16 [phone]

## 2016-05-02 NOTE — ED Notes (Signed)
Pt states "heartburn", states worse with eating, states no relief with OTC meds, pt asked again about blood draws and pt refused, pt looking at cell phone during assessment, awake and alert

## 2016-05-08 ENCOUNTER — Encounter: Payer: Self-pay | Admitting: Emergency Medicine

## 2016-05-08 ENCOUNTER — Emergency Department
Admission: EM | Admit: 2016-05-08 | Discharge: 2016-05-08 | Disposition: A | Discharge status: Home / Self Care | Payer: BLUE CROSS/BLUE SHIELD | Attending: Emergency Medicine | Admitting: Emergency Medicine

## 2016-05-08 ENCOUNTER — Emergency Department: Payer: BLUE CROSS/BLUE SHIELD

## 2016-05-08 DIAGNOSIS — Z87891 Personal history of nicotine dependence: Secondary | ICD-10-CM | POA: Insufficient documentation

## 2016-05-08 DIAGNOSIS — R109 Unspecified abdominal pain: Secondary | ICD-10-CM | POA: Diagnosis not present

## 2016-05-08 DIAGNOSIS — I10 Essential (primary) hypertension: Secondary | ICD-10-CM | POA: Diagnosis not present

## 2016-05-08 DIAGNOSIS — Z7984 Long term (current) use of oral hypoglycemic drugs: Secondary | ICD-10-CM | POA: Diagnosis not present

## 2016-05-08 DIAGNOSIS — E119 Type 2 diabetes mellitus without complications: Secondary | ICD-10-CM | POA: Insufficient documentation

## 2016-05-08 DIAGNOSIS — R110 Nausea: Secondary | ICD-10-CM | POA: Diagnosis not present

## 2016-05-08 LAB — COMPREHENSIVE METABOLIC PANEL
ALK PHOS: 74 U/L (ref 38–126)
ALT: 45 U/L (ref 17–63)
AST: 39 U/L (ref 15–41)
Albumin: 4 g/dL (ref 3.5–5.0)
Anion gap: 11 (ref 5–15)
BUN: 13 mg/dL (ref 6–20)
CALCIUM: 9.1 mg/dL (ref 8.9–10.3)
CHLORIDE: 96 mmol/L — AB (ref 101–111)
CO2: 28 mmol/L (ref 22–32)
CREATININE: 0.98 mg/dL (ref 0.61–1.24)
GFR calc non Af Amer: >60 mL/min (ref >60)
GLUCOSE: 339 mg/dL — AB (ref 65–99)
Potassium: 3.8 mmol/L (ref 3.5–5.1)
SODIUM: 135 mmol/L (ref 135–145)
Total Bilirubin: 0.6 mg/dL (ref 0.3–1.2)
Total Protein: 7.8 g/dL (ref 6.5–8.1)

## 2016-05-08 LAB — CBC
HCT: 41 % (ref 40.0–52.0)
Hemoglobin: 13.4 g/dL (ref 13.0–18.0)
MCH: 28.8 pg (ref 26.0–34.0)
MCHC: 32.6 g/dL (ref 32.0–36.0)
MCV: 88.5 fL (ref 80.0–100.0)
PLATELETS: 290 10*3/uL (ref 150–440)
RBC: 4.64 MIL/uL (ref 4.40–5.90)
RDW: 13.4 % (ref 11.5–14.5)
WBC: 6.4 10*3/uL (ref 3.8–10.6)

## 2016-05-08 LAB — LIPASE, BLOOD: LIPASE: 30 U/L (ref 11–51)

## 2016-05-08 MED ORDER — ONDANSETRON 4 MG PO TBDP
8.0000 mg | ORAL_TABLET | Freq: Once | ORAL | Status: AC
  Administered 2016-05-08: 8 mg via ORAL

## 2016-05-08 MED ORDER — ONDANSETRON 4 MG PO TBDP
ORAL_TABLET | ORAL | Status: AC
  Administered 2016-05-08: 8 mg via ORAL
  Filled 2016-05-08: qty 2

## 2016-05-08 MED ORDER — ONDANSETRON 4 MG PO TBDP: 4.0000 mg | ORAL_TABLET | Freq: Four times a day (QID) | ORAL | 0 refills | Status: DC | PRN

## 2016-05-08 NOTE — ED Triage Notes (Signed)
Pt states that he is having lower abdominal pain for the past few weeks. Pt also reports nausea, no vomiting or diarrhea. Pt has been taking OTC meds without relief.

## 2016-05-08 NOTE — Discharge Instructions (Signed)
You were seen in the emergency room for abdominal pain. It is important that you follow up closely with your primary care doctor in the next couple of days, and I also recommend he continue taking the acid reflux medication your prescribed previously. Also, set up a follow-up with gastroenterology please.  Please return to the emergency room right away if you are to develop a fever, severe nausea, your pain becomes severe or worsens, you are unable to keep food down, begin vomiting any dark or bloody fluid, you develop any dark or bloody stools, feel dehydrated, or other new concerns or symptoms arise.

## 2016-05-08 NOTE — ED Provider Notes (Signed)
[facility] Emergency Department Provider Note   ____________________________________________   First MD Initiated Contact with Patient 05/08/16 1849     (approximate)  I have reviewed the triage vital signs and the nursing notes.   HISTORY  Chief Complaint Abdominal Pain    HPI Alvin Phillips is a 36 y.o. male reports for about the last 2-3 weeks and experiencing nausea, especially in the mornings. He's been eating normally, but occasionally throughout the day will have this hard to describe discomfort or pain in the abdomen. Tends to seem more up in the mid or left area of the stomach. No vomiting. Nausea off and on. No chest pain or shortness of breath.  Reports he states any acid reflux medicine, this is helped with those symptoms, but he continues to have nausea in the mornings  Denies any fever.  He is diabetic, reports compliance medicine. He is been talking to his doctor, and they recommended he get follow-up with a bariatric surgeon for which she is agreeable with has not yet set up a date  Past Medical History:  Diagnosis Date  . Diabetes mellitus without complication (HCC) 07/10/13   referred to DSME, did not show (04/2014)  . GERD (gastroesophageal reflux disease)   . Hypertension   . Morbidly obese (HCC) 03/24/2014  . OSA (obstructive sleep apnea) 10/29/2013    Patient Active Problem List   Diagnosis Date Noted  . Uvulitis 07/31/2014  . Morbid obesity with BMI of 50.0-59.9, adult (HCC) 03/24/2014  . GERD (gastroesophageal reflux disease) 03/24/2014  . Essential hypertension 03/24/2014  . OSA (obstructive sleep apnea) 10/29/2013  . Plantar fasciitis, bilateral 10/29/2013  . Diabetes mellitus type 2, uncontrolled, with complications (HCC) 08/27/2013  . Microalbuminuria 08/27/2013  . Microscopic hematuria 07/22/2013    Past Surgical History:  Procedure Laterality Date  . FRACTURE SURGERY Left 2002   fractured arm  . HERNIA REPAIR   12-16-13   umbilical    Prior to Admission medications   Medication Sig Start Date End Date Taking? Authorizing Provider  glimepiride (AMARYL) 4 MG tablet TAKE ONE TABLET BY MOUTH ONCE DAILY BEFORE BREAKFAST Patient not taking: Reported on 02/24/2016 03/26/15   Eustaquio Boyden, MD  glucose blood (BAYER CONTOUR NEXT TEST) test strip Use as instructed 07/22/13   Raquel Conni Elliot, NP  ibuprofen (ADVIL,MOTRIN) 200 MG tablet Take 200 mg by mouth as needed.    Historical Provider, MD  Lancets [facility] ULTRASOFT) lancets Use as instructed 07/22/13   Raquel Conni Elliot, NP  lisinopril (PRINIVIL,ZESTRIL) 10 MG tablet Take 1 tablet (10 mg total) by mouth daily. 02/24/16   Emi Belfast, FNP  metFORMIN (GLUCOPHAGE) 1000 MG tablet Take 1/2 tablet a day for 3 days then 1/2 tablet a day twice a day for 1 week then 1 tablet twice a day. 02/24/16   Emi Belfast, FNP  omeprazole (PRILOSEC) 40 MG capsule Take 1 capsule (40 mg total) by mouth daily. Take 30 min prior to meal 07/29/14   Eustaquio Boyden, MD  ondansetron (ZOFRAN ODT) 4 MG disintegrating tablet Take 1 tablet (4 mg total) by mouth every 6 (six) hours as needed for nausea or vomiting. 05/08/16   Sharyn Creamer, MD  pantoprazole (PROTONIX) 20 MG tablet Take 1 tablet (20 mg total) by mouth daily. 05/02/16 05/02/17  Phineas Semen, MD    Allergies Patient has no known allergies.  Family History  Problem Relation Age of Onset  . Diabetes Maternal Grandmother   . Diabetes  Paternal Grandfather     Social History Social History  Substance Use Topics  . Smoking status: Former Smoker    Packs/day: 0.25    Years: 15.00    Types: Cigarettes    Quit date: 01/22/2013  . Smokeless tobacco: Never Used  . Alcohol use 0.0 oz/week     Comment: Occassional beer, shots of liquor    Review of Systems Constitutional: No fever/chills Eyes: No visual changes. ENT: No sore throat. Cardiovascular: Denies chest pain. Respiratory: Denies shortness of  breath. Gastrointestinal: No vomiting.  No diarrhea.  No constipation. Does report his symptoms seem to be slightly worsened after eating large meals. Genitourinary: Negative for dysuria. Musculoskeletal: Negative for back pain. Skin: Negative for rash. Neurological: Negative for headaches, focal weakness or numbness.  10-point ROS otherwise negative.  ____________________________________________   PHYSICAL EXAM:  VITAL SIGNS: ED Triage Vitals [05/08/16 1629]  Enc Vitals Group     BP (!) 167/106     Pulse Rate (!) 102     Resp 16     Temp 98.4 F (36.9 C)     Temp Source Oral     SpO2 100 %     Weight (!) 400 lb (181.4 kg)     Height      Head Circumference      Peak Flow      Pain Score 7     Pain Loc      Pain Edu?      Excl. in GC?     Constitutional: Alert and oriented. Well appearing and in no acute distress.Very pleasant Eyes: Conjunctivae are normal.  EOMI. Head: Atraumatic. Nose: No congestion/rhinnorhea. Mouth/Throat: Mucous membranes are moist.   Neck: No stridor.   Cardiovascular: Normal rate, regular rhythm. Grossly normal heart sounds.  Good peripheral circulation. Respiratory: Normal respiratory effort.  No retractions. Lungs CTAB. Gastrointestinal: Soft and nontender. Morbidly obese. No distention. No abdominal bruits. No CVA tenderness. No discomfort on exam presently. Reports at the current time is not having symptoms. Musculoskeletal: No lower extremity tenderness. Walks with normal gait Neurologic:  Normal speech and language. No gross focal neurologic deficits are appreciated. No gait instability. Skin:  Skin is warm, dry and intact. No rash noted. Psychiatric: Mood and affect are normal. Speech and behavior are normal.  ____________________________________________   LABS (all labs ordered are listed, but only abnormal results are displayed)  Labs Reviewed  COMPREHENSIVE METABOLIC PANEL - Abnormal; Notable for the following:       Result  Value   Chloride 96 (*)    Glucose, Bld 339 (*)    All other components within normal limits  LIPASE, BLOOD  CBC  URINALYSIS, COMPLETE (UACMP) WITH MICROSCOPIC   ____________________________________________  EKG   ____________________________________________  RADIOLOGY  Dg Abd 2 Views  Result Date: 05/08/2016 CLINICAL DATA:  Abdominal pain EXAM: ABDOMEN - 2 VIEW COMPARISON:  CT scan 07/09/2013. FINDINGS: Upright film shows no evidence for intraperitoneal free air. There is no evidence for gaseous bowel dilation to suggest obstruction. IMPRESSION: Negative. Electronically Signed   By: Kennith Center M.D.   On: 05/08/2016 20:57    ____________________________________________   PROCEDURES  Procedure(s) performed: None  Procedures  Critical Care performed: No  ____________________________________________   INITIAL IMPRESSION / ASSESSMENT AND PLAN / ED COURSE  Pertinent labs & imaging results that were available during my care of the patient were reviewed by me and considered in my medical decision making (see chart for details).  Nausea, frequently in  the morning. No acute cardiac or pulmonary symptoms. No systemic or infectious symptoms. After receiving Zofran, patient reports no nausea. Currently not having abdominal pain. A reassuring exam. I discussed with the patient, I'll provide impression return for Zofran. We discussed his potential he may have something like a peptic ulcer, slow emptying stomach, or other process which has not yet been identified. He is agreeable to continuing his reflux medicine, trial of Zofran, and close follow-up with his doctor and also willing to set up follow-up gastroenterology for which I'll given him referral information  No evidence of acute abdomen. Negative Murphy. No pain at McBurney's point. No peritonitis.  Return precautions and treatment recommendations and follow-up discussed with the patient who is agreeable with the plan.        ____________________________________________   FINAL CLINICAL IMPRESSION(S) / ED DIAGNOSES  Final diagnoses:  Nausea      NEW MEDICATIONS STARTED DURING THIS VISIT:  Discharge Medication List as of 05/08/2016  9:31 PM    START taking these medications   Details  ondansetron (ZOFRAN ODT) 4 MG disintegrating tablet Take 1 tablet (4 mg total) by mouth every 6 (six) hours as needed for nausea or vomiting., Starting Sun 05/08/2016, Print         Note:  This document was prepared using Dragon voice recognition software and may include unintentional dictation errors.     Sharyn Creamer, MD 05/08/16 2322

## 2016-05-13 ENCOUNTER — Ambulatory Visit (INDEPENDENT_AMBULATORY_CARE_PROVIDER_SITE_OTHER): Payer: BLUE CROSS/BLUE SHIELD | Admitting: Family Medicine

## 2016-05-13 ENCOUNTER — Encounter: Payer: Self-pay | Admitting: Family Medicine

## 2016-05-13 VITALS — BP 150/100 | HR 80 | Temp 97.7°F | Wt 393.0 lb

## 2016-05-13 DIAGNOSIS — R110 Nausea: Secondary | ICD-10-CM

## 2016-05-13 DIAGNOSIS — E118 Type 2 diabetes mellitus with unspecified complications: Secondary | ICD-10-CM | POA: Diagnosis not present

## 2016-05-13 DIAGNOSIS — IMO0002 Reserved for concepts with insufficient information to code with codable children: Secondary | ICD-10-CM

## 2016-05-13 DIAGNOSIS — K219 Gastro-esophageal reflux disease without esophagitis: Secondary | ICD-10-CM | POA: Diagnosis not present

## 2016-05-13 DIAGNOSIS — I10 Essential (primary) hypertension: Secondary | ICD-10-CM | POA: Diagnosis not present

## 2016-05-13 DIAGNOSIS — E1165 Type 2 diabetes mellitus with hyperglycemia: Secondary | ICD-10-CM

## 2016-05-13 DIAGNOSIS — I456 Pre-excitation syndrome: Secondary | ICD-10-CM | POA: Diagnosis not present

## 2016-05-13 DIAGNOSIS — Z6841 Body Mass Index (BMI) 40.0 and over, adult: Secondary | ICD-10-CM

## 2016-05-13 MED ORDER — LISINOPRIL 10 MG PO TABS: 10.0000 mg | ORAL_TABLET | Freq: Every day | ORAL | 3 refills | Status: DC

## 2016-05-13 MED ORDER — SITAGLIPTIN PHOS-METFORMIN HCL 50-500 MG PO TABS: 1.0000 | ORAL_TABLET | Freq: Two times a day (BID) | ORAL | 11 refills | Status: DC

## 2016-05-13 NOTE — Progress Notes (Signed)
BP (!) 150/100 (BP Location: Right Arm, Cuff Size: Large)   Pulse 80   Temp 97.7 F (36.5 C) (Oral)   Wt (!) 393 lb (178.3 kg)   BMI 54.81 kg/m    CC: ER f/u visit Subjective:    Patient ID: Alvin Phillips, male    DOB: 03/23/1980, 36 y.o.   MRN: [id number]  HPI: Alvin Phillips is a 36 y.o. male presenting on 05/13/2016 for Follow-up (ER)   Last seen by me 07/2014. Seen by Deboraha Sprang 02/2016. At that time, metformin was restarted and he was referred to DSME and gen surgery to discuss bariatric surgery.   Seen at ER twice over the past week. First for chest pain and abdominal pain, EKG concerning for WPW pre excitation syndrome. Seen a week later with nausea treated with zofran. He was referred to gastroenterology. zofran has helped - but he needs to take with meals to notice benefit. Records reviewed.   EKG reviewed - delta waves consistent with WPW, NSR 90s.  Obesity - has changed diet - increasing salad intake. 7lb weight loss over the last year.   DM - regularly does not check sugars: a few times in the last 2 weeks, 275 in evenings. Has been skipping lunch. Compliant with antihyperglycemic regimen which includes: metformin 1000mg  bid. Denies low sugars or hypoglycemic symptoms. Denies paresthesias. Last diabetic eye exam DUE.  Pneumovax: declines.  Prevnar: not due. Lab Results  Component Value Date   HGBA1C 9.0 (H) 02/24/2016   Diabetic Foot Exam - Simple   No data filed       Relevant past medical, surgical, family and social history reviewed and updated as indicated. Interim medical history since our last visit reviewed. Allergies and medications reviewed and updated. Outpatient Medications Prior to Visit  Medication Sig Dispense Refill  . glucose blood (BAYER CONTOUR NEXT TEST) test strip Use as instructed 100 each 5  . ibuprofen (ADVIL,MOTRIN) 200 MG tablet Take 200 mg by mouth as needed.    . Lancets (ONETOUCH ULTRASOFT) lancets Use as instructed 100 each 5  .  ondansetron (ZOFRAN ODT) 4 MG disintegrating tablet Take 1 tablet (4 mg total) by mouth every 6 (six) hours as needed for nausea or vomiting. 30 tablet 0  . pantoprazole (PROTONIX) 20 MG tablet Take 1 tablet (20 mg total) by mouth daily. 30 tablet 1  . lisinopril (PRINIVIL,ZESTRIL) 10 MG tablet Take 1 tablet (10 mg total) by mouth daily. 90 tablet 1  . metFORMIN (GLUCOPHAGE) 1000 MG tablet Take 1/2 tablet a day for 3 days then 1/2 tablet a day twice a day for 1 week then 1 tablet twice a day. 180 tablet 1  . glimepiride (AMARYL) 4 MG tablet TAKE ONE TABLET BY MOUTH ONCE DAILY BEFORE BREAKFAST (Patient not taking: Reported on 05/13/2016) 30 tablet 6  . omeprazole (PRILOSEC) 40 MG capsule Take 1 capsule (40 mg total) by mouth daily. Take 30 min prior to meal (Patient not taking: Reported on 05/13/2016) 30 capsule 3   No facility-administered medications prior to visit.      Per HPI unless specifically indicated in ROS section below Review of Systems     Objective:    BP (!) 150/100 (BP Location: Right Arm, Cuff Size: Large)   Pulse 80   Temp 97.7 F (36.5 C) (Oral)   Wt (!) 393 lb (178.3 kg)   BMI 54.81 kg/m   Wt Readings from Last 3 Encounters:  05/13/16 (!) 393 lb (178.3  kg)  05/08/16 (!) 400 lb (181.4 kg)  05/02/16 (!) 400 lb (181.4 kg)    Physical Exam  Constitutional: He appears well-developed and well-nourished. No distress.  HENT:  Head: Normocephalic and atraumatic.  Mouth/Throat: Oropharynx is clear and moist. No oropharyngeal exudate.  Eyes: Conjunctivae and EOM are normal. Pupils are equal, round, and reactive to light. No scleral icterus.  Neck: Normal range of motion. Neck supple.  Cardiovascular: Normal rate, regular rhythm, normal heart sounds and intact distal pulses.   No murmur heard. Pulmonary/Chest: Effort normal and breath sounds normal. No respiratory distress. He has no wheezes. He has no rales.  Musculoskeletal: He exhibits no edema.  See HPI for foot exam  if done  Lymphadenopathy:    He has no cervical adenopathy.  Skin: Skin is warm and dry. No rash noted.  Psychiatric: He has a normal mood and affect.  Nursing note and vitals reviewed.  Results for orders placed or performed during the hospital encounter of 05/08/16  Lipase, blood  Result Value Ref Range   Lipase 30 11 - 51 U/L  Comprehensive metabolic panel  Result Value Ref Range   Sodium 135 135 - 145 mmol/L   Potassium 3.8 3.5 - 5.1 mmol/L   Chloride 96 (L) 101 - 111 mmol/L   CO2 28 22 - 32 mmol/L   Glucose, Bld 339 (H) 65 - 99 mg/dL   BUN 13 6 - 20 mg/dL   Creatinine, Ser 1.61 0.61 - 1.24 mg/dL   Calcium 9.1 8.9 - 09.6 mg/dL   Total Protein 7.8 6.5 - 8.1 g/dL   Albumin 4.0 3.5 - 5.0 g/dL   AST 39 15 - 41 U/L   ALT 45 17 - 63 U/L   Alkaline Phosphatase 74 38 - 126 U/L   Total Bilirubin 0.6 0.3 - 1.2 mg/dL   GFR calc non Af Amer >60 >60 mL/min   GFR calc Af Amer >60 >60 mL/min   Anion gap 11 5 - 15  CBC  Result Value Ref Range   WBC 6.4 3.8 - 10.6 K/uL   RBC 4.64 4.40 - 5.90 MIL/uL   Hemoglobin 13.4 13.0 - 18.0 g/dL   HCT 04.5 40.9 - 81.1 %   MCV 88.5 80.0 - 100.0 fL   MCH 28.8 26.0 - 34.0 pg   MCHC 32.6 32.0 - 36.0 g/dL   RDW 91.4 78.2 - 95.6 %   Platelets 290 150 - 440 K/uL   Lab Results  Component Value Date   HGBA1C 9.0 (H) 02/24/2016       Assessment & Plan:   Problem List Items Addressed This Visit    Diabetes mellitus type 2, uncontrolled, with complications (HCC) - Primary    Chronic, deteriorated. Pt was worried about bad things he had heard about metformin so stopped this. Advised to discuss with me before discontinuing medications. Will trial janumet 50/500mg  BID. Discussed importance of optimizing glycemic control. RTC 1 mo f/u visit with log to review. Pt agrees with plan. Pt requests referral to DSME [facility] when last referred 2016.       Relevant Medications   lisinopril (PRINIVIL,ZESTRIL) 10 MG tablet   sitaGLIPtin-metformin (JANUMET) 50-500  MG tablet   Other Relevant Orders   Ambulatory referral to diabetic education   Essential hypertension    Chronic, uncontrolled. Pt did not take AM lisinopril. Does not check BP at home. Encouraged compliance with daily lisinopril. If remaining elevated next visit, will increase lisinopril.  Relevant Medications   lisinopril (PRINIVIL,ZESTRIL) 10 MG tablet   GERD (gastroesophageal reflux disease)    Continue protonix 20mg  daily - started by ER.       Morbid obesity with BMI of 50.0-59.9, adult (HCC)    Continue to encourage healthy diet changes to affect sustainable weight loss.       Relevant Medications   sitaGLIPtin-metformin (JANUMET) 50-500 MG tablet   Nausea    ?metformin related to GI upset vs gastroparesis. Will decrease metformin dose to janumet 50/500mg  BID. May continue zofran PRN. Continue protonix. Reassess at f/u visit.       WPW (Wolff-Parkinson-White syndrome)    Found by ER. Refer to cardiology      Relevant Medications   lisinopril (PRINIVIL,ZESTRIL) 10 MG tablet   Other Relevant Orders   Ambulatory referral to Cardiology       Follow up plan: Return in about 1 month (around 06/10/2016) for follow up visit.  Eustaquio Boyden, MD

## 2016-05-13 NOTE — Assessment & Plan Note (Addendum)
Chronic, deteriorated. Pt was worried about bad things he had heard about metformin so stopped this. Advised to discuss with me before discontinuing medications. Will trial janumet 50/500mg  BID. Discussed importance of optimizing glycemic control. RTC 1 mo f/u visit with log to review. Pt agrees with plan. Pt requests referral to DSME [facility] when last referred 2016.

## 2016-05-13 NOTE — Progress Notes (Signed)
Pre visit review using our clinic review tool, if applicable. No additional management support is needed unless otherwise documented below in the visit note. 

## 2016-05-13 NOTE — Assessment & Plan Note (Signed)
Continue to encourage healthy diet changes to affect sustainable weight loss. 

## 2016-05-13 NOTE — Assessment & Plan Note (Signed)
Continue protonix 20mg  daily - started by ER.

## 2016-05-13 NOTE — Assessment & Plan Note (Signed)
?  metformin related to GI upset vs gastroparesis. Will decrease metformin dose to janumet 50/500mg  BID. May continue zofran PRN. Continue protonix. Reassess at f/u visit.

## 2016-05-13 NOTE — Patient Instructions (Addendum)
Sign up for diabetes education classes.  We will refer you to cardiology Schedule eye exam as you're due.  Start janumet 50/500mg  twice daily.  Continue lisinopril 10mg  daily.  Return in 1 month for diabetes follow up - keep good log of sugars 1 week prior to seeing me - check fasting in the morning and 2 hours after a meal.

## 2016-05-13 NOTE — Assessment & Plan Note (Signed)
Chronic, uncontrolled. Pt did not take AM lisinopril. Does not check BP at home. Encouraged compliance with daily lisinopril. If remaining elevated next visit, will increase lisinopril.

## 2016-05-13 NOTE — Assessment & Plan Note (Addendum)
Found by ER. Refer to cardiology

## 2016-06-02 NOTE — Progress Notes (Deleted)
Cardiology Office Note  Date:  06/02/2016   ID:  Alvin Phillips, DOB 1981/02/05, MRN [id number]  PCP:  Eustaquio Boyden, MD   No chief complaint on file.   HPI:  Alvin Phillips is a 36 y.o. male presenting on 05/13/2016 for Follow-up (ER)   Last seen by me 07/2014. Seen by Deboraha Sprang 02/2016. At that time, metformin was restarted and he was referred to DSME and gen surgery to discuss bariatric surgery.   Seen at ER twice over the past week. First for chest pain and abdominal pain, EKG concerning for WPW pre excitation syndrome. Seen a week later with nausea treated with zofran. He was referred to gastroenterology. zofran has helped - but he needs to take with meals to notice benefit. Records reviewed.   EKG reviewed - delta waves consistent with WPW, NSR 90s.  Obesity - has changed diet - increasing salad intake. 7lb weight loss over the last year.   DM - regularly does not check sugars: a few times in the last 2 weeks, 275 in evenings. Has been skipping lunch. Compliant with antihyperglycemic regimen which includes: metformin 1000mg  bid. Denies low sugars or hypoglycemic symptoms. Denies paresthesias. Last diabetic eye exam DUE.  Pneumovax: declines.  Prevnar: not due.  PMH:   has a past medical history of Diabetes mellitus without complication (HCC) (07/10/13); GERD (gastroesophageal reflux disease); Hypertension; Morbidly obese (HCC) (03/24/2014); and OSA (obstructive sleep apnea) (10/29/2013).  PSH:    Past Surgical History:  Procedure Laterality Date  . FRACTURE SURGERY Left 2002   fractured arm  . HERNIA REPAIR  12-16-13   umbilical    Current Outpatient Prescriptions  Medication Sig Dispense Refill  . glucose blood (BAYER CONTOUR NEXT TEST) test strip Use as instructed 100 each 5  . ibuprofen (ADVIL,MOTRIN) 200 MG tablet Take 200 mg by mouth as needed.    . Lancets (ONETOUCH ULTRASOFT) lancets Use as instructed 100 each 5  . lisinopril (PRINIVIL,ZESTRIL) 10 MG tablet Take  1 tablet (10 mg total) by mouth daily. 90 tablet 3  . ondansetron (ZOFRAN ODT) 4 MG disintegrating tablet Take 1 tablet (4 mg total) by mouth every 6 (six) hours as needed for nausea or vomiting. 30 tablet 0  . pantoprazole (PROTONIX) 20 MG tablet Take 1 tablet (20 mg total) by mouth daily. 30 tablet 1  . sitaGLIPtin-metformin (JANUMET) 50-500 MG tablet Take 1 tablet by mouth 2 (two) times daily with a meal. 60 tablet 11   No current facility-administered medications for this visit.      Allergies:   Patient has no known allergies.   Social History:  The patient  reports that he quit smoking about 3 years ago. His smoking use included Cigarettes. He has a 3.75 pack-year smoking history. He has never used smokeless tobacco. He reports that he drinks alcohol. He reports that he uses drugs, including Marijuana, about 2 times per week.   Family History:   family history includes Diabetes in his maternal grandmother and paternal grandfather.    Review of Systems: ROS   PHYSICAL EXAM: VS:  There were no vitals taken for this visit. , BMI There is no height or weight on file to calculate BMI. GEN: Well nourished, well developed, in no acute distress HEENT: normal Neck: no JVD, carotid bruits, or masses Cardiac: RRR; no murmurs, rubs, or gallops,no edema  Respiratory:  clear to auscultation bilaterally, normal work of breathing GI: soft, nontender, nondistended, + BS MS: no deformity or atrophy Skin:  warm and dry, no rash Neuro:  Strength and sensation are intact Psych: euthymic mood, full affect    Recent Labs: 05/08/2016: ALT 45; BUN 13; Creatinine, Ser 0.98; Hemoglobin 13.4; Platelets 290; Potassium 3.8; Sodium 135    Lipid Panel Lab Results  Component Value Date   CHOL 170 09/04/2013   HDL 28.40 (L) 09/04/2013   LDLCALC 86 09/04/2013   TRIG 276.0 (H) 09/04/2013      Wt Readings from Last 3 Encounters:  05/13/16 (!) 393 lb (178.3 kg)  05/08/16 (!) 400 lb (181.4 kg)   05/02/16 (!) 400 lb (181.4 kg)       ASSESSMENT AND PLAN:  No diagnosis found.   Disposition:   F/U  6 months  No orders of the defined types were placed in this encounter.    Signed, Dossie Arbour, M.D., Ph.D. 06/02/2016  [facility] [place], [place] [phone]

## 2016-06-03 ENCOUNTER — Ambulatory Visit: Payer: BLUE CROSS/BLUE SHIELD | Admitting: Cardiovascular Disease

## 2016-06-03 ENCOUNTER — Telehealth: Payer: Self-pay | Admitting: Cardiovascular Disease

## 2016-06-03 NOTE — Telephone Encounter (Signed)
Attempted to contact pt, per Dr. Mariah Milling, pt has wpw, and needs to see Dr. Graciela Husbands. Preferably in [place], due to availability. LMOM with Thelma Barge, on DPR to have pt call  And r/s appt.

## 2016-06-10 ENCOUNTER — Ambulatory Visit: Payer: BLUE CROSS/BLUE SHIELD | Admitting: Family Medicine

## 2016-06-22 ENCOUNTER — Ambulatory Visit: Payer: BLUE CROSS/BLUE SHIELD | Admitting: Internal Medicine

## 2016-06-23 ENCOUNTER — Encounter: Payer: Self-pay | Admitting: Internal Medicine

## 2016-07-08 ENCOUNTER — Ambulatory Visit: Payer: BLUE CROSS/BLUE SHIELD | Admitting: Internal Medicine

## 2016-08-25 ENCOUNTER — Ambulatory Visit: Payer: BLUE CROSS/BLUE SHIELD | Admitting: Internal Medicine

## 2016-08-25 NOTE — Progress Notes (Deleted)
ELECTROPHYSIOLOGY CONSULT NOTE  Patient ID: Alvin Phillips, MRN: [id number], DOB/AGE: 04-08-1980 36 y.o. Admit date: (Not on file) Date of Consult: 08/25/2016  Primary Physician: Eustaquio Boyden, MD Primary Cardiologist: new  GERRARD SCHACHER is being seen today for the evaluation of ventricular preexcitation on ECG at the request of  Eustaquio Boyden, MD.   HPI [name] is a 36 y.o. male     He has HTN--poorly controlled, DM dyslipidemia with low HDL and elevated TG and Morbidly obese.  MOst recent A1c 9.0 (12/17)     Past Medical History:  Diagnosis Date  . Diabetes mellitus without complication (HCC) 07/10/13   referred to DSME, did not show (04/2014)  . GERD (gastroesophageal reflux disease)   . Hypertension   . Morbidly obese (HCC) 03/24/2014  . OSA (obstructive sleep apnea) 10/29/2013      Surgical History:  Past Surgical History:  Procedure Laterality Date  . FRACTURE SURGERY Left 2002   fractured arm  . HERNIA REPAIR  12-16-13   umbilical     Home Meds: Prior to Admission medications   Medication Sig Start Date End Date Taking? Authorizing Provider  glucose blood (BAYER CONTOUR NEXT TEST) test strip Use as instructed 07/22/13   Rey, Raquel M, NP  ibuprofen (ADVIL,MOTRIN) 200 MG tablet Take 200 mg by mouth as needed.    [provider]  Lancets Letta Pate ULTRASOFT) lancets Use as instructed 07/22/13   Rey, Richarda Overlie, NP  lisinopril (PRINIVIL,ZESTRIL) 10 MG tablet Take 1 tablet (10 mg total) by mouth daily. 05/13/16   Eustaquio Boyden, MD  ondansetron (ZOFRAN ODT) 4 MG disintegrating tablet Take 1 tablet (4 mg total) by mouth every 6 (six) hours as needed for nausea or vomiting. 05/08/16   Sharyn Creamer, MD  pantoprazole (PROTONIX) 20 MG tablet Take 1 tablet (20 mg total) by mouth daily. 05/02/16 05/02/17  Phineas Semen, MD  sitaGLIPtin-metformin (JANUMET) 50-500 MG tablet Take 1 tablet by mouth 2 (two) times daily with a meal. 05/13/16   Eustaquio Boyden,  MD    Allergies: No Known Allergies  Social History   Social History  . Marital status: Single    Spouse name: N/A  . Number of children: 2  . Years of education: N/A   Occupational History  . Customer Care Representative Self-Employed    [employer]   Social History Main Topics  . Smoking status: Former Smoker    Packs/day: 0.25    Years: 15.00    Types: Cigarettes    Quit date: 01/22/2013  . Smokeless tobacco: Never Used  . Alcohol use 0.0 oz/week     Comment: Occassional beer, shots of liquor  . Drug use: Yes    Frequency: 2.0 times per week    Types: Marijuana     Comment: 1 time per week  . Sexual activity: Not on file   Other Topics Concern  . Not on file   Social History Narrative   Lives with girlfriend   Alvin Phillips grew up in [place]. He is currently living in [place]. He has a daughter and son Alvin Phillips, Alvin Phillips). He breeds dogs Medical sales representative).     Family History  Problem Relation Age of Onset  . Diabetes Maternal Grandmother   . Diabetes Paternal Grandfather      ROS:  Please see the history of present illness.   {ros master:[id number]}  All other systems reviewed and negative.    Physical Exam:*** There were no vitals taken  for this visit. General: Well developed, well nourished male in no acute distress. Head: Normocephalic, atraumatic, sclera non-icteric, no xanthomas, nares are without discharge. EENT: normal  Lymph Nodes:  none Neck: Negative for carotid bruits. JVD not elevated. Back:without scoliosis kyphosis*** Lungs: Clear bilaterally to auscultation without wheezes, rales, or rhonchi. Breathing is unlabored. Heart: RRR with S1 S2. No *** ***/6 systolic*** murmur . No rubs, or gallops appreciated. Abdomen: Soft, non-tender, non-distended with normoactive bowel sounds. No hepatomegaly. No rebound/guarding. No obvious abdominal masses. Msk:  Strength and tone appear normal for age. Extremities: No clubbing or cyanosis. No*** ***+***  edema.  Distal pedal pulses are 2+ and equal bilaterally. Skin: Warm and Dry Neuro: Alert and oriented X 3. CN III-XII intact Grossly normal sensory and motor function . Psych:  Responds to questions appropriately with a normal affect.      Labs: Cardiac Enzymes No results for input(s): CKTOTAL, CKMB, TROPONINI in the last 72 hours. CBC Lab Results  Component Value Date   WBC 6.4 05/08/2016   HGB 13.4 05/08/2016   HCT 41.0 05/08/2016   MCV 88.5 05/08/2016   PLT 290 05/08/2016   PROTIME: No results for input(s): LABPROT, INR in the last 72 hours. Chemistry No results for input(s): NA, K, CL, CO2, BUN, CREATININE, CALCIUM, PROT, BILITOT, ALKPHOS, ALT, AST, GLUCOSE in the last 168 hours.  Invalid input(s): LABALBU Lipids Lab Results  Component Value Date   CHOL 170 09/04/2013   HDL 28.40 (L) 09/04/2013   LDLCALC 86 09/04/2013   TRIG 276.0 (H) 09/04/2013   BNP No results found for: PROBNP Thyroid Function Tests: No results for input(s): TSH, T4TOTAL, T3FREE, THYROIDAB in the last 72 hours.  Invalid input(s): FREET3 Miscellaneous No results found for: DDIMER  Radiology/Studies:  No results found.  EKG:   Personally reviewed   2/18 NSR w ventricular preexcitation- (-) delta V1  (+) delta F >> mid/anteroseptal   Assessment and Plan:  Vetnricular preexcitation  Diabetes  Morbidly obese      Sherryl Manges

## 2016-08-26 ENCOUNTER — Encounter: Payer: Self-pay | Admitting: Internal Medicine

## 2016-09-05 ENCOUNTER — Encounter: Payer: Self-pay | Admitting: Family Medicine

## 2016-10-24 ENCOUNTER — Telehealth: Payer: Self-pay

## 2016-10-24 ENCOUNTER — Encounter: Payer: Self-pay | Admitting: Family Medicine

## 2016-10-24 ENCOUNTER — Ambulatory Visit (INDEPENDENT_AMBULATORY_CARE_PROVIDER_SITE_OTHER): Payer: 59 | Admitting: Family Medicine

## 2016-10-24 VITALS — BP 124/86 | HR 92 | Temp 98.1°F | Ht 71.0 in | Wt 361.4 lb

## 2016-10-24 DIAGNOSIS — IMO0002 Reserved for concepts with insufficient information to code with codable children: Secondary | ICD-10-CM

## 2016-10-24 DIAGNOSIS — Z6841 Body Mass Index (BMI) 40.0 and over, adult: Secondary | ICD-10-CM | POA: Diagnosis not present

## 2016-10-24 DIAGNOSIS — Z23 Encounter for immunization: Secondary | ICD-10-CM | POA: Diagnosis not present

## 2016-10-24 DIAGNOSIS — Z7189 Other specified counseling: Secondary | ICD-10-CM

## 2016-10-24 DIAGNOSIS — I456 Pre-excitation syndrome: Secondary | ICD-10-CM | POA: Diagnosis not present

## 2016-10-24 DIAGNOSIS — H00024 Hordeolum internum left upper eyelid: Secondary | ICD-10-CM | POA: Diagnosis not present

## 2016-10-24 DIAGNOSIS — I10 Essential (primary) hypertension: Secondary | ICD-10-CM | POA: Diagnosis not present

## 2016-10-24 DIAGNOSIS — Z7184 Encounter for health counseling related to travel: Secondary | ICD-10-CM | POA: Insufficient documentation

## 2016-10-24 DIAGNOSIS — H00026 Hordeolum internum left eye, unspecified eyelid: Secondary | ICD-10-CM | POA: Insufficient documentation

## 2016-10-24 DIAGNOSIS — Z0001 Encounter for general adult medical examination with abnormal findings: Secondary | ICD-10-CM | POA: Insufficient documentation

## 2016-10-24 DIAGNOSIS — E118 Type 2 diabetes mellitus with unspecified complications: Secondary | ICD-10-CM | POA: Diagnosis not present

## 2016-10-24 DIAGNOSIS — E1165 Type 2 diabetes mellitus with hyperglycemia: Secondary | ICD-10-CM

## 2016-10-24 MED ORDER — ONETOUCH ULTRASOFT LANCETS MISC: 5 refills | Status: DC

## 2016-10-24 MED ORDER — LISINOPRIL 10 MG PO TABS: 10.0000 mg | ORAL_TABLET | Freq: Every day | ORAL | 11 refills | Status: DC

## 2016-10-24 MED ORDER — METFORMIN HCL 500 MG PO TABS: ORAL_TABLET | ORAL | 6 refills | Status: DC

## 2016-10-24 MED ORDER — PANTOPRAZOLE SODIUM 20 MG PO TBEC: 20.0000 mg | DELAYED_RELEASE_TABLET | Freq: Every day | ORAL | 6 refills | Status: DC

## 2016-10-24 MED ORDER — LISINOPRIL 5 MG PO TABS: 5.0000 mg | ORAL_TABLET | Freq: Every day | ORAL | 11 refills | Status: DC

## 2016-10-24 MED ORDER — ONDANSETRON 4 MG PO TBDP: 4.0000 mg | ORAL_TABLET | Freq: Four times a day (QID) | ORAL | 0 refills | Status: DC | PRN

## 2016-10-24 MED ORDER — GLUCOSE BLOOD VI STRP: ORAL_STRIP | 5 refills | Status: DC

## 2016-10-24 MED ORDER — SITAGLIPTIN PHOS-METFORMIN HCL 50-500 MG PO TABS: 1.0000 | ORAL_TABLET | Freq: Two times a day (BID) | ORAL | 11 refills | Status: DC

## 2016-10-24 NOTE — Assessment & Plan Note (Signed)
Encouraged f/u with EP. He will call to reschedule when he returns from [place].

## 2016-10-24 NOTE — Assessment & Plan Note (Signed)
Ongoing weight loss noted - 30lbs in last 6 months. Concern due to uncontrolled hyperglycemia although he endorses some healthy diet changes. No increased exercise as of yet. Update labs today.

## 2016-10-24 NOTE — Assessment & Plan Note (Signed)
Chronic. bp stable today - anticipate due to weight loss despite non compliance with lisinopril. Refilled lower lisinopril dose at 5mg  daily.

## 2016-10-24 NOTE — Telephone Encounter (Signed)
Pt left v/m; Janumet is too expensive; cost to pt is $300.00 with new ins. Pt wants to know if metformin could be substituted. Pt request cb.

## 2016-10-24 NOTE — Progress Notes (Signed)
BP 124/86 (BP Location: Left Arm, Patient Position: Sitting, Cuff Size: Large)   Pulse 92   Temp 98.1 F (36.7 C) (Oral)   Ht 5\' 11"  (1.803 m)   Wt (!) 361 lb 6.4 oz (163.9 kg)   SpO2 96%   BMI 50.41 kg/m    CC: DM f/u visit Subjective:    Patient ID: Alvin Phillips, male    DOB: 31-Mar-1980, 36 y.o.   MRN: [id number]  HPI: Alvin Phillips is a 36 y.o. male presenting on 10/24/2016 for Follow-up (Pt has tried montioring what he eats, still trying to set up work out rountine, pt has not been monitoring his sugars )   Late for f/u. Trouble getting through on the phone for months.   Planned trip to [place] tomorrow for the next month. fmhx DVT.  WPW - saw Dr Mariah Milling, planned f/u with Dr Graciela Husbands.   HTN - ran out of bp meds 3 wks ago. Doesn't check bp at home.   DM - regularly does not check sugars. Has not taken antihyperglycemic regimen which includes: janumet 50/500mg  bid at all. Denies low sugars or hypoglycemic symptoms. Denies paresthesias. Last diabetic eye exam DUE. Pneumovax: DUE. Prevnar: not due. [facility] DSME referral 2016 and again 2018 - too expensive. He started meal replacement shakes for breakfast instead of biscuits. He bought these through [employer].  Lab Results  Component Value Date   HGBA1C 9.0 (H) 02/24/2016   Diabetic Foot Exam - Simple   No data filed      Relevant past medical, surgical, family and social history reviewed and updated as indicated. Interim medical history since our last visit reviewed. Allergies and medications reviewed and updated. Outpatient Medications Prior to Visit  Medication Sig Dispense Refill  . ibuprofen (ADVIL,MOTRIN) 200 MG tablet Take 200 mg by mouth as needed.    Marland Kitchen glucose blood (BAYER CONTOUR NEXT TEST) test strip Use as instructed (Patient not taking: Reported on 10/24/2016) 100 each 5  . Lancets (ONETOUCH ULTRASOFT) lancets Use as instructed (Patient not taking: Reported on 10/24/2016) 100 each 5  . lisinopril (PRINIVIL,ZESTRIL)  10 MG tablet Take 1 tablet (10 mg total) by mouth daily. (Patient not taking: Reported on 10/24/2016) 90 tablet 3  . ondansetron (ZOFRAN ODT) 4 MG disintegrating tablet Take 1 tablet (4 mg total) by mouth every 6 (six) hours as needed for nausea or vomiting. (Patient not taking: Reported on 10/24/2016) 30 tablet 0  . pantoprazole (PROTONIX) 20 MG tablet Take 1 tablet (20 mg total) by mouth daily. (Patient not taking: Reported on 10/24/2016) 30 tablet 1  . sitaGLIPtin-metformin (JANUMET) 50-500 MG tablet Take 1 tablet by mouth 2 (two) times daily with a meal. (Patient not taking: Reported on 10/24/2016) 60 tablet 11   No facility-administered medications prior to visit.      Per HPI unless specifically indicated in ROS section below Review of Systems     Objective:    BP 124/86 (BP Location: Left Arm, Patient Position: Sitting, Cuff Size: Large)   Pulse 92   Temp 98.1 F (36.7 C) (Oral)   Ht 5\' 11"  (1.803 m)   Wt (!) 361 lb 6.4 oz (163.9 kg)   SpO2 96%   BMI 50.41 kg/m   Wt Readings from Last 3 Encounters:  10/24/16 (!) 361 lb 6.4 oz (163.9 kg)  05/13/16 (!) 393 lb (178.3 kg)  05/08/16 (!) 400 lb (181.4 kg)    Physical Exam  Constitutional: He appears well-developed and well-nourished.  No distress.  HENT:  Head: Normocephalic and atraumatic.  Right Ear: External ear normal.  Left Ear: External ear normal.  Nose: Nose normal.  Mouth/Throat: Oropharynx is clear and moist. No oropharyngeal exudate.  Eyes: Pupils are equal, round, and reactive to light. Conjunctivae and EOM are normal. Right eye exhibits no hordeolum. Left eye exhibits hordeolum. No scleral icterus.    Neck: Normal range of motion. Neck supple.  Cardiovascular: Normal rate, regular rhythm, normal heart sounds and intact distal pulses.   No murmur heard. Pulmonary/Chest: Effort normal and breath sounds normal. No respiratory distress. He has no wheezes. He has no rales.  Musculoskeletal: He exhibits no edema.  See HPI  for foot exam if done  Lymphadenopathy:    He has no cervical adenopathy.  Skin: Skin is warm and dry. No rash noted.  Psychiatric: He has a normal mood and affect.  Nursing note and vitals reviewed.  Results for orders placed or performed during the hospital encounter of 05/08/16  Lipase, blood  Result Value Ref Range   Lipase 30 11 - 51 U/L  Comprehensive metabolic panel  Result Value Ref Range   Sodium 135 135 - 145 mmol/L   Potassium 3.8 3.5 - 5.1 mmol/L   Chloride 96 (L) 101 - 111 mmol/L   CO2 28 22 - 32 mmol/L   Glucose, Bld 339 (H) 65 - 99 mg/dL   BUN 13 6 - 20 mg/dL   Creatinine, Ser 1.61 0.61 - 1.24 mg/dL   Calcium 9.1 8.9 - 09.6 mg/dL   Total Protein 7.8 6.5 - 8.1 g/dL   Albumin 4.0 3.5 - 5.0 g/dL   AST 39 15 - 41 U/L   ALT 45 17 - 63 U/L   Alkaline Phosphatase 74 38 - 126 U/L   Total Bilirubin 0.6 0.3 - 1.2 mg/dL   GFR calc non Af Amer >60 >60 mL/min   GFR calc Af Amer >60 >60 mL/min   Anion gap 11 5 - 15  CBC  Result Value Ref Range   WBC 6.4 3.8 - 10.6 K/uL   RBC 4.64 4.40 - 5.90 MIL/uL   Hemoglobin 13.4 13.0 - 18.0 g/dL   HCT 04.5 40.9 - 81.1 %   MCV 88.5 80.0 - 100.0 fL   MCH 28.8 26.0 - 34.0 pg   MCHC 32.6 32.0 - 36.0 g/dL   RDW 91.4 78.2 - 95.6 %   Platelets 290 150 - 440 K/uL      Assessment & Plan:   Problem List Items Addressed This Visit    Diabetes mellitus type 2, uncontrolled, with complications (HCC) - Primary    Chronically uncontrolled. Ongoing non adherence to medical regimen. He has not been taking janumet, has not been checking sugars. He did not attend DSME (cost concerns). Has not seen eye doctor. Strips/lancets refilled, janumet refilled. Encouraged 1 tab daily x 1 wk then increase to BID. Discussed importance of compliance for diabetes control to help prevent diabetic complications. RTC 66mo f/u visit.       Relevant Medications   sitaGLIPtin-metformin (JANUMET) 50-500 MG tablet   lisinopril (PRINIVIL,ZESTRIL) 5 MG tablet   Other  Relevant Orders   Basic metabolic panel   Hemoglobin A1c   Essential hypertension    Chronic. bp stable today - anticipate due to weight loss despite non compliance with lisinopril. Refilled lower lisinopril dose at 5mg  daily.       Relevant Medications   lisinopril (PRINIVIL,ZESTRIL) 5 MG tablet   Internal hordeolum  of left eye    Supportive care reviewed with regular warm compresses.       Morbid obesity with BMI of 50.0-59.9, adult (HCC)    Ongoing weight loss noted - 30lbs in last 6 months. Concern due to uncontrolled hyperglycemia although he endorses some healthy diet changes. No increased exercise as of yet. Update labs today.       Relevant Medications   sitaGLIPtin-metformin (JANUMET) 50-500 MG tablet   Travel advice encounter    Reviewed CDC recommendations with patient. He is too late to start several immunizations including oral typhoid vaccine. Agrees to first Hep A/B shot today. Will complete series when he returns.  Discussed importance of regular walking breaks on upcoming long plane ride to prevent DVT.       WPW (Wolff-Parkinson-White syndrome)    Encouraged f/u with EP. He will call to reschedule when he returns from [place].       Relevant Medications   lisinopril (PRINIVIL,ZESTRIL) 5 MG tablet       Follow up plan: Return in about 3 months (around 01/24/2017) for follow up visit.  Eustaquio Boyden, MD

## 2016-10-24 NOTE — Telephone Encounter (Signed)
Metformin sent in its place. Will titrate up to 500mg  TID.  Lab Results  Component Value Date   HGBA1C 9.0 (H) 02/24/2016

## 2016-10-24 NOTE — Assessment & Plan Note (Signed)
Chronically uncontrolled. Ongoing non adherence to medical regimen. He has not been taking janumet, has not been checking sugars. He did not attend DSME (cost concerns). Has not seen eye doctor. Strips/lancets refilled, janumet refilled. Encouraged 1 tab daily x 1 wk then increase to BID. Discussed importance of compliance for diabetes control to help prevent diabetic complications. RTC 68mo f/u visit.

## 2016-10-24 NOTE — Assessment & Plan Note (Signed)
Supportive care reviewed with regular warm compresses.

## 2016-10-24 NOTE — Patient Instructions (Addendum)
Hep A/B shot today.  Labs today.  Medicines refilled today.  RTC 3 mo f/u visit.

## 2016-10-24 NOTE — Addendum Note (Signed)
Addended by: Alvina Chou on: 10/24/2016 02:24 PM   Modules accepted: Orders

## 2016-10-24 NOTE — Assessment & Plan Note (Addendum)
Reviewed CDC recommendations with patient. He is too late to start several immunizations including oral typhoid vaccine. Agrees to first Hep A/B shot today. Will complete series when he returns.  Discussed importance of regular walking breaks on upcoming long plane ride to prevent DVT.

## 2016-10-25 NOTE — Telephone Encounter (Signed)
Patient notified

## 2017-01-31 ENCOUNTER — Ambulatory Visit: Payer: 59 | Admitting: Family Medicine

## 2017-02-19 ENCOUNTER — Telehealth: Payer: Self-pay | Admitting: Family Medicine

## 2017-02-19 NOTE — Telephone Encounter (Signed)
Pt overdue for labwork and f/u. No showed appt in November. plz call and schedule f/u with me and encourage f/u with cards.

## 2017-02-20 NOTE — Telephone Encounter (Signed)
Labs 12/4  Appointment with 12/5 with dr g

## 2017-02-21 ENCOUNTER — Other Ambulatory Visit (INDEPENDENT_AMBULATORY_CARE_PROVIDER_SITE_OTHER): Payer: 59

## 2017-02-21 DIAGNOSIS — IMO0002 Reserved for concepts with insufficient information to code with codable children: Secondary | ICD-10-CM

## 2017-02-21 DIAGNOSIS — I10 Essential (primary) hypertension: Secondary | ICD-10-CM

## 2017-02-21 DIAGNOSIS — E1165 Type 2 diabetes mellitus with hyperglycemia: Secondary | ICD-10-CM | POA: Diagnosis not present

## 2017-02-21 DIAGNOSIS — E118 Type 2 diabetes mellitus with unspecified complications: Secondary | ICD-10-CM | POA: Diagnosis not present

## 2017-02-22 ENCOUNTER — Ambulatory Visit: Payer: 59 | Admitting: Family Medicine

## 2017-02-22 LAB — BASIC METABOLIC PANEL
BUN: 14 mg/dL (ref 6–23)
CHLORIDE: 97 meq/L (ref 96–112)
CO2: 30 meq/L (ref 19–32)
CREATININE: 0.91 mg/dL (ref 0.40–1.50)
Calcium: 8.9 mg/dL (ref 8.4–10.5)
GFR: 120.03 mL/min (ref >60.00)
GLUCOSE: 294 mg/dL — AB (ref 70–99)
Potassium: 3.8 mEq/L (ref 3.5–5.1)
Sodium: 137 mEq/L (ref 135–145)

## 2017-02-22 LAB — HEMOGLOBIN A1C: Hgb A1c MFr Bld: 12.3 % — ABNORMAL HIGH (ref 4.6–6.5)

## 2017-02-28 ENCOUNTER — Ambulatory Visit: Payer: 59 | Admitting: Family Medicine

## 2017-04-21 ENCOUNTER — Ambulatory Visit (INDEPENDENT_AMBULATORY_CARE_PROVIDER_SITE_OTHER): Payer: 59 | Admitting: Family Medicine

## 2017-04-21 ENCOUNTER — Telehealth: Payer: Self-pay | Admitting: Family Medicine

## 2017-04-21 ENCOUNTER — Encounter: Payer: Self-pay | Admitting: Family Medicine

## 2017-04-21 DIAGNOSIS — J069 Acute upper respiratory infection, unspecified: Secondary | ICD-10-CM | POA: Diagnosis not present

## 2017-04-21 DIAGNOSIS — E1165 Type 2 diabetes mellitus with hyperglycemia: Secondary | ICD-10-CM

## 2017-04-21 DIAGNOSIS — IMO0002 Reserved for concepts with insufficient information to code with codable children: Secondary | ICD-10-CM

## 2017-04-21 DIAGNOSIS — E118 Type 2 diabetes mellitus with unspecified complications: Secondary | ICD-10-CM

## 2017-04-21 MED ORDER — BENZONATATE 200 MG PO CAPS: 200.0000 mg | ORAL_CAPSULE | Freq: Three times a day (TID) | ORAL | 0 refills | Status: DC | PRN

## 2017-04-21 NOTE — Progress Notes (Signed)
Taking metformin BID now, occ QD.  Encouraged adherence, checking sugar, etc, f/u with PCP.  D/w pt.    Sx started yesterday.  Raspy voice, then throat irritation.  No fevers.  No vomiting, no diarrhea but some loose stools.  Some rhinorrhea.  No ear pain.  No facial pain.  Some cough with throat discomfort during the cough.  Some phlegm, discolored.    Taking dayquil.    Meds, vitals, and allergies reviewed.   ROS: Per HPI unless specifically indicated in ROS section   GEN: nad, alert and oriented HEENT: mucous membranes moist, tm w/o erythema, nasal exam w/o erythema, clear discharge noted,  OP with cobblestoning NECK: supple w/o LA CV: rrr.   PULM: ctab, no inc wob EXT: no edema

## 2017-04-21 NOTE — Telephone Encounter (Signed)
Pt was seen in office today and came for check out to set up another appt. He stood at a desk with a next window sign due to Zella Ball being out and only 3 people in the front office today. I told him no one aws at that desk and he could step over and we would get to him. We had a line to check in so when the next person who was waiting longer stepped up to me this patient sighed loudly and started mumbling under his breath and left. I called the PT after we got the line checked in to schedule and he expressed anger saying "I find it interesting that everybody was too busy to help me and now you find the time to call me." I apologized, explained we are short staffed and doing the very best we can and I could make the appt now and he said he would call back another time.

## 2017-04-21 NOTE — Patient Instructions (Addendum)
Check on follow up with Dr. Reece Agar.   Start checking your sugars.   Use tessalon for the cough.  Okay to try mucinex with plenty of fluids.  Rest and fluids. Rest your voice.  Take care.  Glad to see you.

## 2017-04-23 DIAGNOSIS — J069 Acute upper respiratory infection, unspecified: Secondary | ICD-10-CM | POA: Insufficient documentation

## 2017-04-23 NOTE — Assessment & Plan Note (Signed)
Likely viral. Nontoxic.  Use tessalon for the cough.  Okay to try mucinex with plenty of fluids.  Rest and fluids. Rest voice.  Update [place] as needed.  He agrees.

## 2017-04-23 NOTE — Assessment & Plan Note (Signed)
D/w pt about f/u with PCP.  See AVS.

## 2017-04-24 ENCOUNTER — Telehealth: Payer: Self-pay | Admitting: Family Medicine

## 2017-04-24 MED ORDER — GUAIFENESIN-CODEINE 100-10 MG/5ML PO SYRP: 5.0000 mL | ORAL_SOLUTION | Freq: Three times a day (TID) | ORAL | 0 refills | Status: DC | PRN

## 2017-04-24 NOTE — Telephone Encounter (Signed)
Copied from CRM [id number]. Topic: Quick Communication - See Telephone Encounter >> Apr 24, 2017 10:51 AM Jolayne Haines L wrote: CRM for notification. See Telephone encounter for:   04/24/17.  Patient called and stated that he was seen on Friday for a cough/mucus. He was given benzonatate (TESSALON) 200 MG capsule & does not think that is helping as he is still coughing up mucus. Patient wants to know if he can have something else called in or if he does need to come back in? He said he took some mucinex and alka seltzer and they are not helping.    Call back is [phone] before 4pm Call back is [phone]

## 2017-04-24 NOTE — Telephone Encounter (Signed)
If persistent fever or getting worse overall then needs recheck.  O/w can try cheratussin, rx sent.  Sedation caution on med.  Thanks.

## 2017-04-24 NOTE — Telephone Encounter (Signed)
Patient advised.

## 2017-10-26 ENCOUNTER — Ambulatory Visit (INDEPENDENT_AMBULATORY_CARE_PROVIDER_SITE_OTHER): Payer: 59 | Admitting: Family Medicine

## 2017-10-26 ENCOUNTER — Other Ambulatory Visit: Payer: Self-pay

## 2017-10-26 ENCOUNTER — Encounter: Payer: Self-pay | Admitting: Family Medicine

## 2017-10-26 DIAGNOSIS — E118 Type 2 diabetes mellitus with unspecified complications: Secondary | ICD-10-CM

## 2017-10-26 DIAGNOSIS — IMO0002 Reserved for concepts with insufficient information to code with codable children: Secondary | ICD-10-CM

## 2017-10-26 DIAGNOSIS — L239 Allergic contact dermatitis, unspecified cause: Secondary | ICD-10-CM | POA: Diagnosis not present

## 2017-10-26 DIAGNOSIS — E1165 Type 2 diabetes mellitus with hyperglycemia: Secondary | ICD-10-CM | POA: Diagnosis not present

## 2017-10-26 MED ORDER — TRIAMCINOLONE ACETONIDE 0.5 % EX CREA: 1.0000 "application " | TOPICAL_CREAM | Freq: Two times a day (BID) | CUTANEOUS | 0 refills | Status: DC

## 2017-10-26 MED ORDER — LISINOPRIL 5 MG PO TABS: 5.0000 mg | ORAL_TABLET | Freq: Every day | ORAL | 0 refills | Status: DC

## 2017-10-26 MED ORDER — PANTOPRAZOLE SODIUM 20 MG PO TBEC: 20.0000 mg | DELAYED_RELEASE_TABLET | Freq: Every day | ORAL | 0 refills | Status: DC

## 2017-10-26 NOTE — Patient Instructions (Addendum)
Start zyrtec at bedtime.  Start trimacinolone cream twice daily for itching,  If not improving as expected... Call next week for possible steroid course.  Change to dye free detergent. Make appt ASAP with Dr. Reece Agar.

## 2017-10-26 NOTE — Progress Notes (Signed)
   Subjective:    Patient ID: Alvin Phillips, male    DOB: 1981-01-15, 37 y.o.   MRN: [id number]  Rash  This is a new problem. The current episode started in the past 7 days. The affected locations include the left arm, right arm, left lower leg and right lower leg. The rash is characterized by redness and itchiness (no pustules and no blisters). He was exposed to a new detergent/soap and plant contact ( out side a lot, has dogs, possible plant contact,  has been doing herbal life for 1 month, no sick contacts). Associated symptoms include fatigue. Pertinent negatives include no congestion, cough, facial edema, shortness of breath or sore throat. (Has not been able to have CPAP) Past treatments include anti-itch cream and topical steroids. The treatment provided mild relief. There is no history of allergies, asthma or eczema.   Also needs  refill of metformin and lisinopril, has been off meds.... Over due for DM follow up . Has no appt scheduled... Given was having financial difficulty and unable to pay for appt and meds. Lab Results  Component Value Date   HGBA1C 12.3 Repeated and verified X2. (H) 02/21/2017    Blood pressure 130/90, pulse 95, temperature 98.5 F (36.9 C), temperature source Oral, height 5\' 9"  (1.753 m), weight (!) 370 lb 4 oz (167.9 kg). Social History /Family History/Past Medical History reviewed in detail and updated in EMR if needed.   Review of Systems  Constitutional: Positive for fatigue.  HENT: Negative for congestion and sore throat.   Respiratory: Negative for cough and shortness of breath.   Skin: Positive for rash.       Objective:   Physical Exam  Constitutional: Vital signs are normal. He appears well-developed and well-nourished.  obese  HENT:  Head: Normocephalic.  Right Ear: Hearing normal.  Left Ear: Hearing normal.  Nose: Nose normal.  Mouth/Throat: Oropharynx is clear and moist and mucous membranes are normal.  Neck: Trachea normal. Carotid bruit  is not present. No thyroid mass and no thyromegaly present.  Cardiovascular: Normal rate, regular rhythm and normal pulses. Exam reveals no gallop, no distant heart sounds and no friction rub.  No murmur heard. No peripheral edema  Pulmonary/Chest: Effort normal and breath sounds normal. No respiratory distress.  Skin: Skin is warm, dry and intact. No rash noted.  Erythematous  Macules on legs and arm   Psychiatric: He has a normal mood and affect. His speech is normal and behavior is normal. Thought content normal.          Assessment & Plan:

## 2017-10-26 NOTE — Assessment & Plan Note (Signed)
Overdue for repeat A1C and follow up. Refilled meds until can be seen by PCP in next 2 weeks.

## 2017-10-26 NOTE — Assessment & Plan Note (Signed)
Will avoid oral steroid given  DM poorly controlled.  treat with topical and oral antihistamine.  Multiple possible triggers.. change to hypoallergenic soap etc.

## 2017-10-30 ENCOUNTER — Ambulatory Visit (INDEPENDENT_AMBULATORY_CARE_PROVIDER_SITE_OTHER): Payer: 59 | Admitting: Family Medicine

## 2017-10-30 ENCOUNTER — Encounter: Payer: Self-pay | Admitting: Family Medicine

## 2017-10-30 VITALS — BP 130/90 | HR 96 | Temp 98.2°F | Ht 69.0 in | Wt 369.5 lb

## 2017-10-30 DIAGNOSIS — E1165 Type 2 diabetes mellitus with hyperglycemia: Secondary | ICD-10-CM | POA: Diagnosis not present

## 2017-10-30 DIAGNOSIS — Z6841 Body Mass Index (BMI) 40.0 and over, adult: Secondary | ICD-10-CM

## 2017-10-30 DIAGNOSIS — E118 Type 2 diabetes mellitus with unspecified complications: Secondary | ICD-10-CM

## 2017-10-30 DIAGNOSIS — I10 Essential (primary) hypertension: Secondary | ICD-10-CM | POA: Diagnosis not present

## 2017-10-30 DIAGNOSIS — IMO0002 Reserved for concepts with insufficient information to code with codable children: Secondary | ICD-10-CM

## 2017-10-30 DIAGNOSIS — G4733 Obstructive sleep apnea (adult) (pediatric): Secondary | ICD-10-CM | POA: Diagnosis not present

## 2017-10-30 LAB — POCT GLYCOSYLATED HEMOGLOBIN (HGB A1C): HEMOGLOBIN A1C: 12.3 % — AB (ref 4.0–5.6)

## 2017-10-30 MED ORDER — METFORMIN HCL 500 MG PO TABS: 500.0000 mg | ORAL_TABLET | Freq: Three times a day (TID) | ORAL | 3 refills | Status: DC

## 2017-10-30 MED ORDER — LISINOPRIL 5 MG PO TABS: 5.0000 mg | ORAL_TABLET | Freq: Every day | ORAL | 3 refills | Status: DC

## 2017-10-30 NOTE — Assessment & Plan Note (Signed)
Chronic, mildly elevated due to non adherence to medication regimen. Has restarted lisinopril - refilled today.

## 2017-10-30 NOTE — Patient Instructions (Addendum)
We will refer you back to sleep doctors.  Labs today. Return in 3 months for follow up visit.  Watch blood pressures at local pharmacy, watch blood sugars with meter at home and bring me log - check either fasting before breakfast or 2 hours after a meal. Goal blood pressure is <140/90, ideally <130/85.

## 2017-10-30 NOTE — Assessment & Plan Note (Signed)
Was unable to f/u with pulm due to financial cocnerns last year - requests re referral today.

## 2017-10-30 NOTE — Assessment & Plan Note (Signed)
Chronic, uncontrolled with high A1c 12. He restarted metformin yesterday. rec BID x 1 wk then increase 500mg  to TID AC. RTC 3 mo f/u visit. Foot exam today. Offered DSME and encouraged he consider this.

## 2017-10-30 NOTE — Assessment & Plan Note (Signed)
Pt motivated for ongoing weight loss - has joined gym, has Systems analyst he's going to start seeing next week.

## 2017-10-30 NOTE — Progress Notes (Signed)
BP 130/90 (BP Location: Right Arm, Patient Position: Sitting, Cuff Size: Large)   Pulse 96   Temp 98.2 F (36.8 C) (Oral)   Ht 5\' 9"  (1.753 m)   Wt (!) 369 lb 8 oz (167.6 kg)   SpO2 98%   BMI 54.57 kg/m    CC: DM f/u visit Subjective:    Patient ID: Alvin Phillips, male    DOB: 1980/05/02, 37 y.o.   MRN: [id number]  HPI: Alvin Phillips is a 37 y.o. male presenting on 10/30/2017 for Diabetes (Here for f/u.)   I last saw patient 10/2016. Has seen other providers at our office in the interim.  Seen last week by Dr Ermalene Searing for allergic dermatitis - treated with topical and oral antihistamines and topical steroid. This is better.  Trying to get to [employer] more regularly - to start working with new trainer on Monday. Asks about garcinia cambogia.   HTN - Compliant with current antihypertensive regimen of lisinopril 5mg  daily - started 2 days ago. Does not check blood pressures at home. No low blood pressure readings or symptoms of dizziness/syncope. Denies HA, vision changes, CP/tightness, leg swelling. Occasional dyspnea.  OSA dx by sleep study - was unable to afford f/u. Requests re referral to sleep doctor today. Persistent daytime somnolence, snoring.  DM - does not regularly check sugars. Has just restarted metformin. Denies low sugars or hypoglycemic symptoms. Denies paresthesias. Last diabetic eye exam DUE. Pneumovax: DUE. Prevnar: not due. Glucometer brand: unsure. DSME: has not completed.  Lab Results  Component Value Date   HGBA1C 12.3 (A) 10/30/2017   Diabetic Foot Exam - Simple   Simple Foot Form Diabetic Foot exam was performed with the following findings:  Yes 10/30/2017  4:32 PM  Visual Inspection No deformities, no ulcerations, no other skin breakdown bilaterally:  Yes Sensation Testing Intact to touch and monofilament testing bilaterally:  Yes Pulse Check Posterior Tibialis and Dorsalis pulse intact bilaterally:  Yes Comments    Lab Results  Component Value  Date   MICROALBUR 29.6 (H) 02/24/2016     Relevant past medical, surgical, family and social history reviewed and updated as indicated. Interim medical history since our last visit reviewed. Allergies and medications reviewed and updated. Outpatient Medications Prior to Visit  Medication Sig Dispense Refill  . ibuprofen (ADVIL,MOTRIN) 200 MG tablet Take 200 mg by mouth as needed.    . pantoprazole (PROTONIX) 20 MG tablet Take 1 tablet (20 mg total) by mouth daily. (Patient taking differently: Take 20 mg by mouth daily. Takes as needed) 30 tablet 0  . triamcinolone cream (KENALOG) 0.5 % Apply 1 application topically 2 (two) times daily. (Patient taking differently: Apply 1 application topically 2 (two) times daily. Uses as needed) 30 g 0  . lisinopril (PRINIVIL,ZESTRIL) 5 MG tablet Take 1 tablet (5 mg total) by mouth daily. 30 tablet 0  . metFORMIN (GLUCOPHAGE) 500 MG tablet Take one tablet daily for 1 week then increase to two tablets daily for 1 wk then three tablets daily with meals 90 tablet 6  . glucose blood (BAYER CONTOUR NEXT TEST) test strip Use as instructed (Patient not taking: Reported on 10/26/2017) 100 each 5  . Lancets (ONETOUCH ULTRASOFT) lancets Use as instructed (Patient not taking: Reported on 10/26/2017) 100 each 5   No facility-administered medications prior to visit.      Per HPI unless specifically indicated in ROS section below Review of Systems     Objective:    BP  130/90 (BP Location: Right Arm, Patient Position: Sitting, Cuff Size: Large)   Pulse 96   Temp 98.2 F (36.8 C) (Oral)   Ht 5\' 9"  (1.753 m)   Wt (!) 369 lb 8 oz (167.6 kg)   SpO2 98%   BMI 54.57 kg/m   Wt Readings from Last 3 Encounters:  10/30/17 (!) 369 lb 8 oz (167.6 kg)  10/26/17 (!) 370 lb 4 oz (167.9 kg)  04/21/17 (!) 377 lb (171 kg)    Physical Exam  Constitutional: He appears well-developed and well-nourished. No distress.  HENT:  Head: Normocephalic and atraumatic.  Right Ear:  External ear normal.  Left Ear: External ear normal.  Nose: Nose normal.  Mouth/Throat: Oropharynx is clear and moist. No oropharyngeal exudate.  Eyes: Pupils are equal, round, and reactive to light. Conjunctivae and EOM are normal. No scleral icterus.  Neck: Normal range of motion. Neck supple.  Cardiovascular: Normal rate, regular rhythm, normal heart sounds and intact distal pulses.  No murmur heard. Pulmonary/Chest: Effort normal and breath sounds normal. No respiratory distress. He has no wheezes. He has no rales.  Musculoskeletal: He exhibits no edema.  See HPI for foot exam if done  Lymphadenopathy:    He has no cervical adenopathy.  Skin: Skin is warm and dry. No rash noted.  Psychiatric: He has a normal mood and affect.  Nursing note and vitals reviewed.  Results for orders placed or performed in visit on 10/30/17  POCT glycosylated hemoglobin (Hb A1C)  Result Value Ref Range   Hemoglobin A1C 12.3 (A) 4.0 - 5.6 %   HbA1c POC (<> result, manual entry)     HbA1c, POC (prediabetic range)     HbA1c, POC (controlled diabetic range)        Assessment & Plan:  Unable to stay for labs - will return next week for this.  Problem List Items Addressed This Visit    OSA (obstructive sleep apnea)    Was unable to f/u with pulm due to financial cocnerns last year - requests re referral today.       Relevant Orders   Ambulatory referral to Pulmonology   Morbid obesity with BMI of 50.0-59.9, adult (HCC)    Pt motivated for ongoing weight loss - has joined gym, has Systems analyst he's going to start seeing next week.       Relevant Medications   metFORMIN (GLUCOPHAGE) 500 MG tablet   Essential hypertension    Chronic, mildly elevated due to non adherence to medication regimen. Has restarted lisinopril - refilled today.       Relevant Medications   lisinopril (PRINIVIL,ZESTRIL) 5 MG tablet   Diabetes mellitus type 2, uncontrolled, with complications (HCC) - Primary    Chronic,  uncontrolled with high A1c 12. He restarted metformin yesterday. rec BID x 1 wk then increase 500mg  to TID AC. RTC 3 mo f/u visit. Foot exam today. Offered DSME and encouraged he consider this.       Relevant Medications   lisinopril (PRINIVIL,ZESTRIL) 5 MG tablet   metFORMIN (GLUCOPHAGE) 500 MG tablet   Other Relevant Orders   POCT glycosylated hemoglobin (Hb A1C) (Completed)   Lipid panel   Basic metabolic panel       Meds ordered this encounter  Medications  . lisinopril (PRINIVIL,ZESTRIL) 5 MG tablet    Sig: Take 1 tablet (5 mg total) by mouth daily.    Dispense:  90 tablet    Refill:  3  . metFORMIN (GLUCOPHAGE) 500  MG tablet    Sig: Take 1 tablet (500 mg total) by mouth 3 (three) times daily before meals.    Dispense:  270 tablet    Refill:  3   Orders Placed This Encounter  Procedures  . Lipid panel    Standing Status:   Future    Standing Expiration Date:   10/31/2018  . Basic metabolic panel    Standing Status:   Future    Standing Expiration Date:   10/31/2018  . Ambulatory referral to Pulmonology    Referral Priority:   Routine    Referral Type:   Consultation    Referral Reason:   Specialty Services Required    Requested Specialty:   Pulmonary Disease    Number of Visits Requested:   1  . POCT glycosylated hemoglobin (Hb A1C)    Follow up plan: Return in about 3 months (around 01/30/2018) for follow up visit.  Eustaquio Boyden, MD

## 2017-11-01 ENCOUNTER — Other Ambulatory Visit: Payer: 59

## 2017-12-11 ENCOUNTER — Ambulatory Visit: Payer: Self-pay | Admitting: *Deleted

## 2017-12-11 NOTE — Telephone Encounter (Signed)
Pt calling with complaints on increased shortness of breath for approximately a week. Pt denies any history of cardiac or lung disease.Pt states that with walking into work from the car he has increased shortness of breath and feels like he needs an inhaler. Pt mentions that he was diagnosed with sleep apnea and initally was unable to afford a CPAP machine due to financial reasons. Pt states he has been only gets 2-3 hours of sleep at night for about 30 min at a time. Pt states he has been dozing all day at work.Pt states she has a sharp pain on the left side of chest that comes and goes from time to time. Pt states that he has also noted swelling in bilateral ankles for th past couple of days. Pt states that this is "the worst I have felt in my life". Pt advised to seek treatment in the ED for current symptoms. Pt verbalized understanding.  Reason for Disposition . [1] MODERATE difficulty breathing (e.g., speaks in phrases, SOB even at rest, pulse 100-120) AND [2] NEW-onset or WORSE than normal  Answer Assessment - Initial Assessment Questions 1. RESPIRATORY STATUS: "Describe your breathing?" (e.g., wheezing, shortness of breath, unable to speak, severe coughing)      Shortness of breath with walking from car into job that has worsened over the past week 2. ONSET: "When did this breathing problem begin?"      About a week ago 3. PATTERN "Does the difficult breathing come and go, or has it been constant since it started?"      Gets worse with activity and walking 4. SEVERITY: "How bad is your breathing?" (e.g., mild, moderate, severe)    - MILD: No SOB at rest, mild SOB with walking, speaks normally in sentences, can lay down, no retractions, pulse < 100.    - MODERATE: SOB at rest, SOB with minimal exertion and prefers to sit, cannot lie down flat, speaks in phrases, mild retractions, audible wheezing, pulse 100-120.    - SEVERE: Very SOB at rest, speaks in single words, struggling to breathe, sitting  hunched forward, retractions, pulse > 120      Moderate 5. RECURRENT SYMPTOM: "Have you had difficulty breathing before?" If so, ask: "When was the last time?" and "What happened that time?"      Yes but not as bad as it is now 6. CARDIAC HISTORY: "Do you have any history of heart disease?" (e.g., heart attack, angina, bypass surgery, angioplasty)      No 7. LUNG HISTORY: "Do you have any history of lung disease?"  (e.g., pulmonary embolus, asthma, emphysema)     No 8. CAUSE: "What do you think is causing the breathing problem?"      Thinks if may be due to not going to gym in a month 9. OTHER SYMPTOMS: "Do you have any other symptoms? (e.g., dizziness, runny nose, cough, chest pain, fever)     Sharp pain in chest time to time on the left side 10. PREGNANCY: "Is there any chance you are pregnant?" "When was your last menstrual period?"       n/a 11. TRAVEL: "Have you traveled out of the country in the last month?" (e.g., travel history, exposures) Not assessed  Protocols used: BREATHING DIFFICULTY-A-AH

## 2017-12-12 ENCOUNTER — Encounter: Payer: Self-pay | Admitting: Family Medicine

## 2017-12-12 ENCOUNTER — Ambulatory Visit (INDEPENDENT_AMBULATORY_CARE_PROVIDER_SITE_OTHER): Payer: 59 | Admitting: Family Medicine

## 2017-12-12 ENCOUNTER — Ambulatory Visit: Payer: Self-pay

## 2017-12-12 ENCOUNTER — Ambulatory Visit (INDEPENDENT_AMBULATORY_CARE_PROVIDER_SITE_OTHER)
Admission: RE | Admit: 2017-12-12 | Discharge: 2017-12-12 | Disposition: A | Discharge status: Home / Self Care | Payer: 59 | Source: Ambulatory Visit | Attending: Family Medicine | Admitting: Family Medicine

## 2017-12-12 VITALS — BP 140/110 | HR 100 | Temp 98.2°F | Ht 69.0 in | Wt 375.0 lb

## 2017-12-12 DIAGNOSIS — R079 Chest pain, unspecified: Secondary | ICD-10-CM | POA: Diagnosis not present

## 2017-12-12 DIAGNOSIS — I10 Essential (primary) hypertension: Secondary | ICD-10-CM

## 2017-12-12 DIAGNOSIS — I456 Pre-excitation syndrome: Secondary | ICD-10-CM | POA: Diagnosis not present

## 2017-12-12 DIAGNOSIS — E1165 Type 2 diabetes mellitus with hyperglycemia: Secondary | ICD-10-CM | POA: Diagnosis not present

## 2017-12-12 DIAGNOSIS — E118 Type 2 diabetes mellitus with unspecified complications: Secondary | ICD-10-CM | POA: Diagnosis not present

## 2017-12-12 DIAGNOSIS — Z6841 Body Mass Index (BMI) 40.0 and over, adult: Secondary | ICD-10-CM

## 2017-12-12 DIAGNOSIS — G4733 Obstructive sleep apnea (adult) (pediatric): Secondary | ICD-10-CM

## 2017-12-12 DIAGNOSIS — R0609 Other forms of dyspnea: Secondary | ICD-10-CM

## 2017-12-12 DIAGNOSIS — IMO0002 Reserved for concepts with insufficient information to code with codable children: Secondary | ICD-10-CM

## 2017-12-12 DIAGNOSIS — R0602 Shortness of breath: Secondary | ICD-10-CM | POA: Insufficient documentation

## 2017-12-12 DIAGNOSIS — R0789 Other chest pain: Secondary | ICD-10-CM

## 2017-12-12 LAB — CBC WITH DIFFERENTIAL/PLATELET
BASOS ABS: 0 10*3/uL (ref 0.0–0.1)
Basophils Relative: 0.6 % (ref 0.0–3.0)
EOS ABS: 0 10*3/uL (ref 0.0–0.7)
Eosinophils Relative: 0.5 % (ref 0.0–5.0)
HEMATOCRIT: 44.4 % (ref 39.0–52.0)
Hemoglobin: 14.3 g/dL (ref 13.0–17.0)
LYMPHS PCT: 40.6 % (ref 12.0–46.0)
Lymphs Abs: 2.9 10*3/uL (ref 0.7–4.0)
MCHC: 32.1 g/dL (ref 30.0–36.0)
MCV: 90.1 fl (ref 78.0–100.0)
MONOS PCT: 7.8 % (ref 3.0–12.0)
Monocytes Absolute: 0.6 10*3/uL (ref 0.1–1.0)
NEUTROS ABS: 3.6 10*3/uL (ref 1.4–7.7)
Neutrophils Relative %: 50.5 % (ref 43.0–77.0)
PLATELETS: 385 10*3/uL (ref 150.0–400.0)
RBC: 4.93 Mil/uL (ref 4.22–5.81)
RDW: 13.7 % (ref 11.5–15.5)
WBC: 7.1 10*3/uL (ref 4.0–10.5)

## 2017-12-12 LAB — COMPREHENSIVE METABOLIC PANEL
ALBUMIN: 3.8 g/dL (ref 3.5–5.2)
ALK PHOS: 59 U/L (ref 39–117)
ALT: 52 U/L (ref 0–53)
AST: 29 U/L (ref 0–37)
BILIRUBIN TOTAL: 0.8 mg/dL (ref 0.2–1.2)
BUN: 15 mg/dL (ref 6–23)
CALCIUM: 9.1 mg/dL (ref 8.4–10.5)
CO2: 30 mEq/L (ref 19–32)
Chloride: 99 mEq/L (ref 96–112)
Creatinine, Ser: 0.93 mg/dL (ref 0.40–1.50)
GFR: 116.55 mL/min (ref >60.00)
Glucose, Bld: 306 mg/dL — ABNORMAL HIGH (ref 70–99)
POTASSIUM: 4.2 meq/L (ref 3.5–5.1)
Sodium: 135 mEq/L (ref 135–145)
TOTAL PROTEIN: 7.1 g/dL (ref 6.0–8.3)

## 2017-12-12 LAB — LIPID PANEL
CHOLESTEROL: 174 mg/dL (ref 0–200)
HDL: 27.8 mg/dL — AB (ref >39.00)
NONHDL: 145.79
TRIGLYCERIDES: 313 mg/dL — AB (ref 0.0–149.0)
Total CHOL/HDL Ratio: 6
VLDL: 62.6 mg/dL — ABNORMAL HIGH (ref 0.0–40.0)

## 2017-12-12 LAB — BRAIN NATRIURETIC PEPTIDE: Pro B Natriuretic peptide (BNP): 206 pg/mL — ABNORMAL HIGH (ref 0.0–100.0)

## 2017-12-12 LAB — TSH: TSH: 1.24 u[IU]/mL (ref 0.35–4.50)

## 2017-12-12 LAB — LDL CHOLESTEROL, DIRECT: Direct LDL: 110 mg/dL

## 2017-12-12 LAB — TROPONIN I: TNIDX: 0.03 ug/l (ref 0.00–0.06)

## 2017-12-12 MED ORDER — FUROSEMIDE 20 MG PO TABS: 20.0000 mg | ORAL_TABLET | Freq: Every day | ORAL | 1 refills | Status: DC | PRN

## 2017-12-12 MED ORDER — ASPIRIN EC 81 MG PO TBEC: 81.0000 mg | DELAYED_RELEASE_TABLET | Freq: Every day | ORAL | Status: DC

## 2017-12-12 MED ORDER — LISINOPRIL 10 MG PO TABS: 10.0000 mg | ORAL_TABLET | Freq: Every day | ORAL | 1 refills | Status: DC

## 2017-12-12 MED ORDER — METFORMIN HCL ER 750 MG PO TB24: 750.0000 mg | ORAL_TABLET | Freq: Every day | ORAL | 1 refills | Status: DC

## 2017-12-12 NOTE — Patient Instructions (Addendum)
EKG today Xray today Labs today Increase lisinopril to 10mg  daily.  Start aspirin 81mg  daily.  Start lasix 20mg  daily as needed for leg swelling or weight gain - try 1/2 tablet first.  Try metformin XR 750mg  one tablet daily, if tolerated go up to 2 tablets daily after 1 week. Let me know if cost unaffordable.  See our referral coordinators for urgent cardiology evaluation. Return to see pulmonology for CPAP.  If no improvement in symptoms, or any worsening, go straight to the ER.

## 2017-12-12 NOTE — Assessment & Plan Note (Addendum)
Chronically uncontrolled. Never followed through with diabetes education, medication regimen, or regular office visits. Again reviewed risks of uncontrolled diabetes as they relate to cardiac disease. Pt states he has trouble remembering frequent dosing of metformin - will have him price out metformin XR 750mg  daily with option to increase to 2 tablets daily. Discussed with high A1c will likely need multiple medications to achieve goal glycemic control. He declines injectable medication.

## 2017-12-12 NOTE — Assessment & Plan Note (Addendum)
Untreated. Never returned to pulm. Agrees to return today - will expedite eval.

## 2017-12-12 NOTE — Assessment & Plan Note (Signed)
Concern for acute CHF exacerbation in h/o longstanding untreated OSA, r/o ACS. Check labs (BNP, TnI) and EKG and CXR today - enlarged heart with vascular congestion. Discussed with patient likely has component of heart failure. Will increase lisinopril to 10mg  daily for better BP control, add lasix 20mg  daily PRN dyspnea, leg swelling. Will expedite cards referral.

## 2017-12-12 NOTE — Telephone Encounter (Signed)
See other note

## 2017-12-12 NOTE — Progress Notes (Signed)
New Outpatient Visit Date: 12/13/2017  Referring Provider: Eustaquio Boyden, MD [address] [place], [place] [address]  Chief Complaint: Chest pain and shortness of breath  HPI:  Alvin Phillips is a 37 y.o. male who is being seen today for the evaluation of shortness of breath and chest pain at the request of Dr. Sharen Hones. He has a history of Wolff-Parkinson-White, hypertension, uncontrolled diabetes mellitus secondary to medication noncompliance, GERD, morbid obesity, and obstructive sleep apnea.  He contacted Dr. Nicanor Alcon office yesterday complaining of a 1 to 2-week history of exertional dyspnea and intermittent chest pain.  He was advised to go to the ER but left without being seen.  He subsequently saw Dr. Sharen Hones yesterday, at which time he reported increased exertional dyspnea, orthopnea, and leg edema.  Over the last month, Alvin Phillips reports that he has developed progressive shortness of breath.  He now feels quite out of breath when walking less than 100 yards.  He also has noted leg swelling during this time as well as intermittent PND.  He also notes that his heart races sometimes when he wakes up short of breath at night.  He reports chest pain "from time to time," often after eating.  He describes it as sharp left-sided chest pain lasting only a few seconds.  He has not had any exertional chest pain.  Alvin Phillips denies a history of prior heart disease or testing.  He was told several years ago that his EKG was abnormal and that he should be seen by a cardiologist (he did not follow through with this recommendation).  --------------------------------------------------------------------------------------------------  Cardiovascular History & Procedures: Cardiovascular Problems:  Chest pain  Shortness of breath  WPW  Risk Factors:  Hypertension, diabetes mellitus, male gender, and morbid obesity  Cath/PCI:  None  CV Surgery:  None  EP Procedures and  Devices:  None  Non-Invasive Evaluation(s):  None  Recent CV Pertinent Labs: Lab Results  Component Value Date   CHOL 174 12/12/2017   HDL 27.80 (L) 12/12/2017   LDLCALC 86 09/04/2013   LDLDIRECT 110.0 12/12/2017   TRIG 313.0 (H) 12/12/2017   CHOLHDL 6 12/12/2017   K 4.2 12/12/2017   K 3.6 12/10/2013   BUN 15 12/12/2017   BUN 11 12/10/2013   CREATININE 0.93 12/12/2017   CREATININE 0.97 12/10/2013    --------------------------------------------------------------------------------------------------  Past Medical History:  Diagnosis Date  . Diabetes mellitus without complication (HCC) 07/10/13   referred to DSME, did not show (04/2014)  . GERD (gastroesophageal reflux disease)   . Hypertension   . Morbidly obese (HCC) 03/24/2014  . OSA (obstructive sleep apnea) 10/29/2013    Past Surgical History:  Procedure Laterality Date  . FRACTURE SURGERY Left 2002   fractured arm  . HERNIA REPAIR  12-16-13   umbilical    Current Meds  Medication Sig  . aspirin EC 81 MG tablet Take 1 tablet (81 mg total) by mouth daily.  . furosemide (LASIX) 20 MG tablet Take 1 tablet (20 mg total) by mouth daily as needed for fluid or edema.  Marland Kitchen glucose blood (BAYER CONTOUR NEXT TEST) test strip Use as instructed  . ibuprofen (ADVIL,MOTRIN) 200 MG tablet Take 200 mg by mouth as needed.  . Lancets (ONETOUCH ULTRASOFT) lancets Use as instructed  . lisinopril (PRINIVIL,ZESTRIL) 10 MG tablet Take 1 tablet (10 mg total) by mouth daily.  . metFORMIN (GLUCOPHAGE XR) 750 MG 24 hr tablet Take 1 tablet (750 mg total) by mouth daily with breakfast.  .  pantoprazole (PROTONIX) 20 MG tablet Take 1 tablet (20 mg total) by mouth daily. (Patient taking differently: Take 20 mg by mouth daily. Takes as needed)    Allergies: Patient has no known allergies.  Social History   Tobacco Use  . Smoking status: Former Smoker    Packs/day: 0.25    Years: 15.00    Pack years: 3.75    Types: Cigarettes    Last  attempt to quit: 01/22/2013    Years since quitting: 4.8  . Smokeless tobacco: Never Used  Substance Use Topics  . Alcohol use: Yes    Alcohol/week: 0.0 standard drinks    Comment: Occassional beer, shots of liquor  . Drug use: Yes    Frequency: 2.0 times per week    Types: Marijuana    Comment: 1 time per week    Family History  Problem Relation Age of Onset  . Diabetes Maternal Grandmother   . Diabetes Paternal Grandfather     Review of Systems: A 12-system review of systems was performed and was negative except as noted in the HPI.  --------------------------------------------------------------------------------------------------  Physical Exam: BP (!) 144/102 (BP Location: Right Arm, Patient Position: Sitting, Cuff Size: Large)   Pulse 96   Ht 5\' 10"  (1.778 m)   Wt (!) 372 lb 8 oz (169 kg)   BMI 53.45 kg/m   General:  Morbidly obese man, seated on the exam table. HEENT: No conjunctival pallor or scleral icterus. Moist mucous membranes. OP clear. Neck: Supple without lymphadenopathy, thyromegaly, JVD, or HJR. No carotid bruit. Lungs: Normal work of breathing. Clear to auscultation bilaterally without wheezes or crackles. Heart: Regular rate and rhythm without murmurs, rubs, or gallops. Unable to assess PMI due to body habitus. Abd: Bowel sounds present. Soft, NT/ND.  Unable to assess HSM due to body habitus. Ext: Trace pretibial edema bilaterally. Radial, PT, and DP pulses are 2+ bilaterally Skin: Warm and dry without rash. Neuro: CNIII-XII intact. Strength and fine-touch sensation intact in upper and lower extremities bilaterally. Psych: Normal mood and affect.  EKG:  Normal sinus rhythm with WPW.  Lab Results  Component Value Date   WBC 7.1 12/12/2017   HGB 14.3 12/12/2017   HCT 44.4 12/12/2017   MCV 90.1 12/12/2017   PLT 385.0 12/12/2017    Lab Results  Component Value Date   NA 135 12/12/2017   K 4.2 12/12/2017   CL 99 12/12/2017   CO2 30 12/12/2017    BUN 15 12/12/2017   CREATININE 0.93 12/12/2017   GLUCOSE 306 (H) 12/12/2017   ALT 52 12/12/2017    Lab Results  Component Value Date   CHOL 174 12/12/2017   HDL 27.80 (L) 12/12/2017   LDLCALC 86 09/04/2013   LDLDIRECT 110.0 12/12/2017   TRIG 313.0 (H) 12/12/2017   CHOLHDL 6 12/12/2017     --------------------------------------------------------------------------------------------------  ASSESSMENT AND PLAN: Shortness of breath and atypical chest pain Symptoms are most consistent with heart failure (unclear if systolic or diastolic).  He was just started on furosemide yesterday, which I have encouraged him to continue.  We will obtain a transthoracic echocardiogram.  If there are no significant structural abnormalities, we will follow the echo with a myocardial perfusion stress test.  Otherwise, we will need to consider left and right heart catheterization.  In the meantime, I have encouraged Alvin Phillips to limit his salt intake.  WPW Noted on EKG today.  He notes occasional palpitations that accompany his PND.  He has never passed out.  I  will refer him to EP for further evaluation.  Hypertension BP poorly controlled today.  I advised him to continue lisinopril and furosemide, which were just started.  I also encouraged Alvin Phillips to proceed with repeat sleep study.  Morbid obesity Weight loss encouraged through diet and exercise.  Information regarding DASH diet provided today.  Follow-up: Return to clinic in 1 month with APP.  Yvonne Kendall, MD 12/13/2017 10:25 AM

## 2017-12-12 NOTE — Telephone Encounter (Signed)
Seen in office today  

## 2017-12-12 NOTE — Telephone Encounter (Signed)
See triage note from 12/12/17.

## 2017-12-12 NOTE — Assessment & Plan Note (Signed)
H/o this - never followed up with cards.

## 2017-12-12 NOTE — Progress Notes (Signed)
BP (!) 140/110 (BP Location: Left Arm, Patient Position: Sitting, Cuff Size: Large)   Pulse 100   Temp 98.2 F (36.8 C) (Oral)   Ht 5\' 9"  (1.753 m)   Wt (!) 375 lb (170.1 kg)   SpO2 96%   BMI 55.38 kg/m    CC: dyspnea, elevated BP Subjective:    Patient ID: Alvin Phillips, male    DOB: [birth year], 37 y.o.   MRN: [id number]  HPI: Alvin Phillips is a 37 y.o. male presenting on 12/12/2017 for Elevated BP (States he had elevated BP reading at [facility] yesterday, 129/123. ) and Shortness of Breath (C/o SOB on exertion. States he sometimes breathes heavy when sitting and HR increases. Started 1-2 wks ago. )   Called in yesterday with 1-2 wks of exertional dyspnea with intermittent chest pain - advised ER, went to ER last night, left without being seen. Here today for evaluation.   Chest pain described as left sided sharp pain that can last a few minutes. Stretching helps. No known triggers. Resting doesn't help. Dyspnea after walking 200 ft. Orthopnea - new sleeping in recliner over the past 1-2 month.  Increasing ankle swelling over last week.   No h/o asthma.  No fmhx CAD.   Uncontrolled diabetic, h/o nonadherence to medications or care plan, never returned for f/u labs last month. Known OSA never followed up on CPAP initially concern with cost.  Prescribed metformin 500mg  TID - states he only takes once or twice daily.  Lab Results  Component Value Date   HGBA1C 12.3 (A) 10/30/2017     Relevant past medical, surgical, family and social history reviewed and updated as indicated. Interim medical history since our last visit reviewed. Allergies and medications reviewed and updated. Outpatient Medications Prior to Visit  Medication Sig Dispense Refill  . ibuprofen (ADVIL,MOTRIN) 200 MG tablet Take 200 mg by mouth as needed.    . pantoprazole (PROTONIX) 20 MG tablet Take 1 tablet (20 mg total) by mouth daily. (Patient taking differently: Take 20 mg by mouth daily. Takes as needed) 30  tablet 0  . lisinopril (PRINIVIL,ZESTRIL) 5 MG tablet Take 1 tablet (5 mg total) by mouth daily. 90 tablet 3  . metFORMIN (GLUCOPHAGE) 500 MG tablet Take 1 tablet (500 mg total) by mouth 3 (three) times daily before meals. 270 tablet 3  . glucose blood (BAYER CONTOUR NEXT TEST) test strip Use as instructed (Patient not taking: Reported on 10/26/2017) 100 each 5  . Lancets (ONETOUCH ULTRASOFT) lancets Use as instructed (Patient not taking: Reported on 10/26/2017) 100 each 5  . triamcinolone cream (KENALOG) 0.5 % Apply 1 application topically 2 (two) times daily. (Patient taking differently: Apply 1 application topically 2 (two) times daily. Uses as needed) 30 g 0   No facility-administered medications prior to visit.      Per HPI unless specifically indicated in ROS section below Review of Systems     Objective:    BP (!) 140/110 (BP Location: Left Arm, Patient Position: Sitting, Cuff Size: Large)   Pulse 100   Temp 98.2 F (36.8 C) (Oral)   Ht 5\' 9"  (1.753 m)   Wt (!) 375 lb (170.1 kg)   SpO2 96%   BMI 55.38 kg/m   Wt Readings from Last 3 Encounters:  12/12/17 (!) 375 lb (170.1 kg)  10/30/17 (!) 369 lb 8 oz (167.6 kg)  10/26/17 (!) 370 lb 4 oz (167.9 kg)    Physical Exam  Constitutional: He appears well-developed  and well-nourished.  HENT:  Mouth/Throat: Oropharynx is clear and moist. No oropharyngeal exudate.  Neck: No JVD present.  Cardiovascular: Regular rhythm and normal heart sounds. Tachycardia present.  No murmur heard. Mild tachycardia  Pulmonary/Chest: Effort normal. No respiratory distress. He has decreased breath sounds. He has no wheezes. He has no rhonchi. He has no rales.  Coarse distant breath sounds Crackles bibasilarly  Musculoskeletal: He exhibits edema (1+ pitting to just above ankles).  Skin: Skin is warm and dry. No rash noted.  Nursing note and vitals reviewed.  Results for orders placed or performed in visit on 10/30/17  POCT glycosylated hemoglobin  (Hb A1C)  Result Value Ref Range   Hemoglobin A1C 12.3 (A) 4.0 - 5.6 %   HbA1c POC (<> result, manual entry)     HbA1c, POC (prediabetic range)     HbA1c, POC (controlled diabetic range)     Lab Results  Component Value Date   CREATININE 0.91 02/21/2017   BUN 14 02/21/2017   NA 137 02/21/2017   K 3.8 02/21/2017   CL 97 02/21/2017   CO2 30 02/21/2017    Lab Results  Component Value Date   CHOL 170 09/04/2013   HDL 28.40 (L) 09/04/2013   LDLCALC 86 09/04/2013   LDLDIRECT 88.0 07/29/2014   TRIG 276.0 (H) 09/04/2013   CHOLHDL 6 09/04/2013   DG Chest 2 View CLINICAL DATA:  Shortness of breath and chest pain  EXAM: CHEST - 2 VIEW  COMPARISON:  April 11, 2010  FINDINGS: There Is mild interstitial edema. There are areas of atelectatic change in the left mid lung and right base. There is no consolidation. There is cardiomegaly with pulmonary venous hypertension. No adenopathy. No bone lesions.  IMPRESSION: Cardiomegaly with pulmonary vascular congestion. Mild interstitial edema. There may be a degree of underlying congestive heart failure. No consolidation. Areas of mild atelectatic change bilaterally.  Electronically Signed   By: Bretta Bang III M.D.   On: 12/12/2017 13:11   EKG - sinus tachycardia rate 100s, shortened PR, WPW, diffuse ST changes overall unchanged from prior EKG 04/2016    Assessment & Plan:   Problem List Items Addressed This Visit    WPW (Wolff-Parkinson-White syndrome)    H/o this - never followed up with cards.       Relevant Medications   lisinopril (PRINIVIL,ZESTRIL) 10 MG tablet   aspirin EC 81 MG tablet   furosemide (LASIX) 20 MG tablet   Other Relevant Orders   Ambulatory referral to Cardiology   OSA (obstructive sleep apnea)    Untreated. Never returned to pulm. Agrees to return today - will expedite eval.       Relevant Orders   Ambulatory referral to Cardiology   Morbid obesity with BMI of 50.0-59.9, adult (HCC)    Relevant Medications   metFORMIN (GLUCOPHAGE XR) 750 MG 24 hr tablet   Exertional dyspnea - Primary    Concern for acute CHF exacerbation in h/o longstanding untreated OSA, r/o ACS. Check labs (BNP, TnI) and EKG and CXR today - enlarged heart with vascular congestion. Discussed with patient likely has component of heart failure. Will increase lisinopril to 10mg  daily for better BP control, add lasix 20mg  daily PRN dyspnea, leg swelling. Will expedite cards referral.       Relevant Orders   DG Chest 2 View (Completed)   Comprehensive metabolic panel   TSH   CBC with Differential/Platelet   Brain natriuretic peptide   Troponin I   EKG 12-Lead (  Completed)   Ambulatory referral to Cardiology   Essential hypertension    BP elevated - increase lisinopril to 10mg  daily.       Relevant Medications   lisinopril (PRINIVIL,ZESTRIL) 10 MG tablet   aspirin EC 81 MG tablet   furosemide (LASIX) 20 MG tablet   Diabetes mellitus type 2, uncontrolled, with complications (HCC)    Chronically uncontrolled. Never followed through with diabetes education, medication regimen, or regular office visits. Again reviewed risks of uncontrolled diabetes as they relate to cardiac disease. Pt states he has trouble remembering frequent dosing of metformin - will have him price out metformin XR 750mg  daily with option to increase to 2 tablets daily. Discussed with high A1c will likely need multiple medications to achieve goal glycemic control. He declines injectable medication.       Relevant Medications   lisinopril (PRINIVIL,ZESTRIL) 10 MG tablet   metFORMIN (GLUCOPHAGE XR) 750 MG 24 hr tablet   aspirin EC 81 MG tablet   Other Relevant Orders   Lipid panel   Comprehensive metabolic panel   Chest discomfort    Check EKG, TnI today.  Start aspirin 81mg  daily. Advised seek immediate ER care if worsening symptoms.       Relevant Orders   DG Chest 2 View (Completed)   Comprehensive metabolic panel   TSH    CBC with Differential/Platelet   Brain natriuretic peptide   Troponin I   EKG 12-Lead (Completed)   Ambulatory referral to Cardiology       Meds ordered this encounter  Medications  . lisinopril (PRINIVIL,ZESTRIL) 10 MG tablet    Sig: Take 1 tablet (10 mg total) by mouth daily.    Dispense:  90 tablet    Refill:  1  . metFORMIN (GLUCOPHAGE XR) 750 MG 24 hr tablet    Sig: Take 1 tablet (750 mg total) by mouth daily with breakfast.    Dispense:  90 tablet    Refill:  1  . aspirin EC 81 MG tablet    Sig: Take 1 tablet (81 mg total) by mouth daily.  . furosemide (LASIX) 20 MG tablet    Sig: Take 1 tablet (20 mg total) by mouth daily as needed for fluid or edema.    Dispense:  30 tablet    Refill:  1   Orders Placed This Encounter  Procedures  . DG Chest 2 View    Standing Status:   Future    Number of Occurrences:   1    Standing Expiration Date:   02/12/2019    Order Specific Question:   Reason for Exam (SYMPTOM  OR DIAGNOSIS REQUIRED)    Answer:   dyspnea with chest pain    Order Specific Question:   Preferred imaging location?    Answer:   [facility]    Order Specific Question:   Radiology Contrast Protocol - do NOT remove file path    Answer:   [email or web address]  . Lipid panel  . Comprehensive metabolic panel  . TSH  . CBC with Differential/Platelet  . Brain natriuretic peptide  . Troponin I  . Ambulatory referral to Cardiology    Referral Priority:   Urgent    Referral Type:   Consultation    Referral Reason:   Specialty Services Required    Requested Specialty:   Cardiology    Number of Visits Requested:   1  . EKG 12-Lead    Follow up plan: Return in about 4 weeks (around 01/09/2018), or  if symptoms worsen or fail to improve, for follow up visit.  Eustaquio Boyden, MD

## 2017-12-12 NOTE — Telephone Encounter (Signed)
Verlon Au RN with [facility] said that pt went to ED last evening but wait was too long and left; pt does not have CP now and SOB upon exertion. Pt wants elevated BP to be eval. Pt still does not want to go to ED. Scheduled appt with Dr Reece Agar 12/12/17 at 11:15 and if develops CP, SOB pt instructed to go to ED per [facility] and pt voiced understanding. FYI to Dr Reece Agar.

## 2017-12-12 NOTE — Assessment & Plan Note (Addendum)
Check EKG, TnI today.  Start aspirin 81mg  daily. Advised seek immediate ER care if worsening symptoms.

## 2017-12-12 NOTE — Assessment & Plan Note (Signed)
BP elevated - increase lisinopril to 10mg daily.  

## 2017-12-12 NOTE — Telephone Encounter (Signed)
Pt. Reports he does not have chest pain this morning. Went to ED last night "and the wait was going to be to long, so I left." States has shortness of breath with exertion. States "a month ago I was going to the gym 2-3 times a week with no problem." Chest pain "comes and goes." Concerned with elevated BP reading from last night - also has "slight headache." Request OV. Spoke with Rena - OK to schedule OV this morning. Instructed pt. If shortness of breath worsens or he develops chest pain, to go to ED. Verbalizes understanding.  Reason for Disposition . Systolic BP  >= 180 OR Diastolic >= 110  Answer Assessment - Initial Assessment Questions 1. BLOOD PRESSURE: "What is the blood pressure?" "Did you take at least two measurements 5 minutes apart?"     Last night at [employer] BP was 169/123 2. ONSET: "When did you take your blood pressure?"     Last night 3. HOW: "How did you obtain the blood pressure?" (e.g., visiting nurse, automatic home BP monitor)     Drug store 4. HISTORY: "Do you have a history of high blood pressure?"     Yes 5. MEDICATIONS: "Are you taking any medications for blood pressure?" "Have you missed any doses recently?"     Yes - takes medication 6. OTHER SYMPTOMS: "Do you have any symptoms?" (e.g., headache, chest pain, blurred vision, difficulty breathing, weakness)     Slight headache this morning 7. PREGNANCY: "Is there any chance you are pregnant?" "When was your last menstrual period?"     n/a  Protocols used: HIGH BLOOD PRESSURE-A-AH

## 2017-12-13 ENCOUNTER — Ambulatory Visit (INDEPENDENT_AMBULATORY_CARE_PROVIDER_SITE_OTHER): Payer: 59 | Admitting: Internal Medicine

## 2017-12-13 ENCOUNTER — Encounter: Payer: Self-pay | Admitting: Internal Medicine

## 2017-12-13 ENCOUNTER — Encounter: Payer: Self-pay | Admitting: *Deleted

## 2017-12-13 VITALS — BP 144/102 | HR 96 | Ht 70.0 in | Wt 372.5 lb

## 2017-12-13 VITALS — BP 138/102 | HR 107 | Resp 16 | Ht 69.0 in | Wt 372.0 lb

## 2017-12-13 DIAGNOSIS — I10 Essential (primary) hypertension: Secondary | ICD-10-CM | POA: Diagnosis not present

## 2017-12-13 DIAGNOSIS — I456 Pre-excitation syndrome: Secondary | ICD-10-CM

## 2017-12-13 DIAGNOSIS — R0602 Shortness of breath: Secondary | ICD-10-CM | POA: Diagnosis not present

## 2017-12-13 DIAGNOSIS — G4719 Other hypersomnia: Secondary | ICD-10-CM

## 2017-12-13 DIAGNOSIS — R0789 Other chest pain: Secondary | ICD-10-CM

## 2017-12-13 NOTE — Patient Instructions (Signed)
Medication Instructions:  Your physician recommends that you continue on your current medications as directed. Please refer to the Current Medication list given to you today.   Labwork: none  Testing/Procedures: Your physician has requested that you have an echocardiogram. Echocardiography is a painless test that uses sound waves to create images of your heart. It provides your doctor with information about the size and shape of your heart and how well your heart's chambers and valves are working. This procedure takes approximately one hour. There are no restrictions for this procedure. You may get an IV, if needed, to receive an ultrasound enhancing agent through to better visualize your heart.    Follow-Up: You have been referred to ELECTROPHYSIOLOGY CARDIOLOGIST- DR Graciela Husbands.   Your physician recommends that you schedule a follow-up appointment in: 1 MONTH WITH APP.  If you need a refill on your cardiac medications before your next appointment, please call your pharmacy.     Echocardiogram An echocardiogram, or echocardiography, uses sound waves (ultrasound) to produce an image of your heart. The echocardiogram is simple, painless, obtained within a short period of time, and offers valuable information to your health care provider. The images from an echocardiogram can provide information such as:  Evidence of coronary artery disease (CAD).  Heart size.  Heart muscle function.  Heart valve function.  Aneurysm detection.  Evidence of a past heart attack.  Fluid buildup around the heart.  Heart muscle thickening.  Assess heart valve function.  Tell a health care provider about:  Any allergies you have.  All medicines you are taking, including vitamins, herbs, eye drops, creams, and over-the-counter medicines.  Any problems you or family members have had with anesthetic medicines.  Any blood disorders you have.  Any surgeries you have had.  Any medical conditions  you have.  Whether you are pregnant or may be pregnant. What happens before the procedure? No special preparation is needed. Eat and drink normally. What happens during the procedure?  In order to produce an image of your heart, gel will be applied to your chest and a wand-like tool (transducer) will be moved over your chest. The gel will help transmit the sound waves from the transducer. The sound waves will harmlessly bounce off your heart to allow the heart images to be captured in real-time motion. These images will then be recorded.  You may need an IV to receive a medicine that improves the quality of the pictures. What happens after the procedure? You may return to your normal schedule including diet, activities, and medicines, unless your health care provider tells you otherwise. This information is not intended to replace advice given to you by your health care provider. Make sure you discuss any questions you have with your health care provider. Document Released: 03/04/2000 Document Revised: 10/24/2015 Document Reviewed: 11/12/2012 Elsevier Interactive Patient Education  2017 [employer]    DASH Eating Plan DASH stands for "Dietary Approaches to Stop Hypertension." The DASH eating plan is a healthy eating plan that has been shown to reduce high blood pressure (hypertension). It may also reduce your risk for type 2 diabetes, heart disease, and stroke. The DASH eating plan may also help with weight loss. What are tips for following this plan? General guidelines  Avoid eating more than 2,300 mg (milligrams) of salt (sodium) a day. If you have hypertension, you may need to reduce your sodium intake to 1,500 mg a day.  Limit alcohol intake to no more than 1 drink a day  for nonpregnant women and 2 drinks a day for men. One drink equals 12 oz of beer, 5 oz of wine, or 1 oz of hard liquor.  Work with your health care provider to maintain a healthy body weight or to lose weight. Ask  what an ideal weight is for you.  Get at least 30 minutes of exercise that causes your heart to beat faster (aerobic exercise) most days of the week. Activities may include walking, swimming, or biking.  Work with your health care provider or diet and nutrition specialist (dietitian) to adjust your eating plan to your individual calorie needs. Reading food labels  Check food labels for the amount of sodium per serving. Choose foods with less than 5 percent of the Daily Value of sodium. Generally, foods with less than 300 mg of sodium per serving fit into this eating plan.  To find whole grains, look for the word "whole" as the first word in the ingredient list. Shopping  Buy products labeled as "low-sodium" or "no salt added."  Buy fresh foods. Avoid canned foods and premade or frozen meals. Cooking  Avoid adding salt when cooking. Use salt-free seasonings or herbs instead of table salt or sea salt. Check with your health care provider or pharmacist before using salt substitutes.  Do not fry foods. Cook foods using healthy methods such as baking, boiling, grilling, and broiling instead.  Cook with heart-healthy oils, such as olive, canola, soybean, or sunflower oil. Meal planning   Eat a balanced diet that includes: ? 5 or more servings of fruits and vegetables each day. At each meal, try to fill half of your plate with fruits and vegetables. ? Up to 6-8 servings of whole grains each day. ? Less than 6 oz of lean meat, poultry, or fish each day. A 3-oz serving of meat is about the same size as a deck of cards. One egg equals 1 oz. ? 2 servings of low-fat dairy each day. ? A serving of nuts, seeds, or beans 5 times each week. ? Heart-healthy fats. Healthy fats called Omega-3 fatty acids are found in foods such as flaxseeds and coldwater fish, like sardines, salmon, and mackerel.  Limit how much you eat of the following: ? Canned or prepackaged foods. ? Food that is high in trans  fat, such as fried foods. ? Food that is high in saturated fat, such as fatty meat. ? Sweets, desserts, sugary drinks, and other foods with added sugar. ? Full-fat dairy products.  Do not salt foods before eating.  Try to eat at least 2 vegetarian meals each week.  Eat more home-cooked food and less restaurant, buffet, and fast food.  When eating at a restaurant, ask that your food be prepared with less salt or no salt, if possible. What foods are recommended? The items listed may not be a complete list. Talk with your dietitian about what dietary choices are best for you. Grains Whole-grain or whole-wheat bread. Whole-grain or whole-wheat pasta. Brown rice. Orpah Cobb. Bulgur. Whole-grain and low-sodium cereals. Pita bread. Low-fat, low-sodium crackers. Whole-wheat flour tortillas. Vegetables Fresh or frozen vegetables (raw, steamed, roasted, or grilled). Low-sodium or reduced-sodium tomato and vegetable juice. Low-sodium or reduced-sodium tomato sauce and tomato paste. Low-sodium or reduced-sodium canned vegetables. Fruits All fresh, dried, or frozen fruit. Canned fruit in natural juice (without added sugar). Meat and other protein foods Skinless chicken or [place]. Ground chicken or [place]. Pork with fat trimmed off. Fish and seafood. Egg whites. Dried beans, peas, or  lentils. Unsalted nuts, nut butters, and seeds. Unsalted canned beans. Lean cuts of beef with fat trimmed off. Low-sodium, lean deli meat. Dairy Low-fat (1%) or fat-free (skim) milk. Fat-free, low-fat, or reduced-fat cheeses. Nonfat, low-sodium ricotta or cottage cheese. Low-fat or nonfat yogurt. Low-fat, low-sodium cheese. Fats and oils Soft margarine without trans fats. Vegetable oil. Low-fat, reduced-fat, or light mayonnaise and salad dressings (reduced-sodium). Canola, safflower, olive, soybean, and sunflower oils. Avocado. Seasoning and other foods Herbs. Spices. Seasoning mixes without salt. Unsalted popcorn and  pretzels. Fat-free sweets. What foods are not recommended? The items listed may not be a complete list. Talk with your dietitian about what dietary choices are best for you. Grains Baked goods made with fat, such as croissants, muffins, or some breads. Dry pasta or rice meal packs. Vegetables Creamed or fried vegetables. Vegetables in a cheese sauce. Regular canned vegetables (not low-sodium or reduced-sodium). Regular canned tomato sauce and paste (not low-sodium or reduced-sodium). Regular tomato and vegetable juice (not low-sodium or reduced-sodium). Rosita Fire. Olives. Fruits Canned fruit in a light or heavy syrup. Fried fruit. Fruit in cream or butter sauce. Meat and other protein foods Fatty cuts of meat. Ribs. Fried meat. Tomasa Blase. Sausage. Bologna and other processed lunch meats. Salami. Fatback. Hotdogs. Bratwurst. Salted nuts and seeds. Canned beans with added salt. Canned or smoked fish. Whole eggs or egg yolks. Chicken or [place] with skin. Dairy Whole or 2% milk, cream, and half-and-half. Whole or full-fat cream cheese. Whole-fat or sweetened yogurt. Full-fat cheese. Nondairy creamers. Whipped toppings. Processed cheese and cheese spreads. Fats and oils Butter. Stick margarine. Lard. Shortening. Ghee. Bacon fat. Tropical oils, such as coconut, palm kernel, or palm oil. Seasoning and other foods Salted popcorn and pretzels. Onion salt, garlic salt, seasoned salt, table salt, and sea salt. Worcestershire sauce. Tartar sauce. Barbecue sauce. Teriyaki sauce. Soy sauce, including reduced-sodium. Steak sauce. Canned and packaged gravies. Fish sauce. Oyster sauce. Cocktail sauce. Horseradish that you find on the shelf. Ketchup. Mustard. Meat flavorings and tenderizers. Bouillon cubes. Hot sauce and Tabasco sauce. Premade or packaged marinades. Premade or packaged taco seasonings. Relishes. Regular salad dressings. Where to find more information:  National Heart, Lung, and Blood Institute:  PopSteam.is  American Heart Association: www.heart.org Summary  The DASH eating plan is a healthy eating plan that has been shown to reduce high blood pressure (hypertension). It may also reduce your risk for type 2 diabetes, heart disease, and stroke.  With the DASH eating plan, you should limit salt (sodium) intake to 2,300 mg a day. If you have hypertension, you may need to reduce your sodium intake to 1,500 mg a day.  When on the DASH eating plan, aim to eat more fresh fruits and vegetables, whole grains, lean proteins, low-fat dairy, and heart-healthy fats.  Work with your health care provider or diet and nutrition specialist (dietitian) to adjust your eating plan to your individual calorie needs. This information is not intended to replace advice given to you by your health care provider. Make sure you discuss any questions you have with your health care provider. Document Released: 02/24/2011 Document Revised: 02/29/2016 Document Reviewed: 02/29/2016 Elsevier Interactive Patient Education  [employer].

## 2017-12-13 NOTE — Progress Notes (Signed)
[facility]  Pulmonary Medicine Consultation      Assessment and Plan:  Excessive daytime sleepiness.  -Symptoms and signs of obstructive sleep apnea.  Previous diagnosis of sleep severe obstructive sleep apnea, but was never started on CPAP. - We will send for sleep study and start on CPAP as appropriate. - Previous titration study in 2016 showed that the patient required a CPAP pressure of 16.  His initial AHI at a pressure of 5 was 28, but decreased to below 10 and all subsequent pressures. - Given obesity discussed the importance of weight loss.   Essential hypertension, diabetes mellitus. -Obstructive sleep apnea can contribute to above conditions, therefore treatment of sleep apnea is important part of management.   Date: 12/13/2017  MRN# [id number] Alvin Phillips 1980/06/16   Alvin Phillips is a 37 y.o. old male seen in consultation for chief complaint of:    Chief Complaint  Patient presents with  . Consult    referred by Dr.Gutierrez for sleep apnea eval:  . Sleep Apnea    pt saw Dr. Vassie Loll in [facility] 2016 had a sleep study.    HPI:  The patient is a 37 year old male referred for symptoms of excessive daytime sleepiness.  He usually goes to bed around 10 PM to 12 midnight.  Falls asleep within a few minutes.  Wakes up at 6:30 AM.  Epworth score is elevated at 20. He was diagnosed with severe OSA in about 2016, he never started insurance. He finds that he does not sleep well, he wakes up several times per night. He is very sleepy during the day. He works at a call center, and has trouble concentrating.  Denies sleep paralysis, or sleep walking, no cataplexy.   Denies jaw pain, no dentures, no TMJ.   Review old records. **CPAP titration 08/25/2014>> raw data personally reviewed, CPAP was recommended at 16.  AHI was 28 at initial CPAP level of 5, decreased to below 10 at all subsequent pressures.   PMHX:   Past Medical History:  Diagnosis Date  . Diabetes mellitus without  complication (HCC) 07/10/13   referred to DSME, did not show (04/2014)  . GERD (gastroesophageal reflux disease)   . Hypertension   . Morbidly obese (HCC) 03/24/2014  . OSA (obstructive sleep apnea) 10/29/2013   Surgical Hx:  Past Surgical History:  Procedure Laterality Date  . FRACTURE SURGERY Left 2002   fractured arm  . HERNIA REPAIR  12-16-13   umbilical   Family Hx:  Family History  Problem Relation Age of Onset  . Diabetes Maternal Grandmother   . Diabetes Paternal Grandfather    Social Hx:   Social History   Tobacco Use  . Smoking status: Former Smoker    Packs/day: 0.25    Years: 15.00    Pack years: 3.75    Types: Cigarettes    Last attempt to quit: 01/22/2013    Years since quitting: 4.8  . Smokeless tobacco: Never Used  Substance Use Topics  . Alcohol use: Yes    Alcohol/week: 0.0 standard drinks    Comment: Occassional beer, shots of liquor  . Drug use: Yes    Frequency: 2.0 times per week    Types: Marijuana    Comment: 1 time per week   Medication:    Current Outpatient Medications:  .  aspirin EC 81 MG tablet, Take 1 tablet (81 mg total) by mouth daily., Disp: , Rfl:  .  furosemide (LASIX) 20 MG tablet, Take 1 tablet (20  mg total) by mouth daily as needed for fluid or edema., Disp: 30 tablet, Rfl: 1 .  glucose blood (BAYER CONTOUR NEXT TEST) test strip, Use as instructed, Disp: 100 each, Rfl: 5 .  ibuprofen (ADVIL,MOTRIN) 200 MG tablet, Take 200 mg by mouth as needed., Disp: , Rfl:  .  Lancets (ONETOUCH ULTRASOFT) lancets, Use as instructed, Disp: 100 each, Rfl: 5 .  lisinopril (PRINIVIL,ZESTRIL) 10 MG tablet, Take 1 tablet (10 mg total) by mouth daily., Disp: 90 tablet, Rfl: 1 .  metFORMIN (GLUCOPHAGE XR) 750 MG 24 hr tablet, Take 1 tablet (750 mg total) by mouth daily with breakfast., Disp: 90 tablet, Rfl: 1 .  pantoprazole (PROTONIX) 20 MG tablet, Take 1 tablet (20 mg total) by mouth daily. (Patient taking differently: Take 20 mg by mouth daily. Takes as  needed), Disp: 30 tablet, Rfl: 0   Allergies:  Patient has no known allergies.  Review of Systems: Gen:  Denies  fever, sweats, chills HEENT: Denies blurred vision, double vision. bleeds, sore throat Cvc:  No dizziness, chest pain. Resp:   Denies cough or sputum production, shortness of breath Gi: Denies swallowing difficulty, stomach pain. Gu:  Denies bladder incontinence, burning urine Ext:   No Joint pain, stiffness. Skin: No skin rash,  hives  Endoc:  No polyuria, polydipsia. Psych: No depression, insomnia. Other:  All other systems were reviewed with the patient and were negative other that what is mentioned in the HPI.   Physical Examination:   VS: BP (!) 148/116 (BP Location: Left Arm, Cuff Size: Large)   Pulse (!) 107   Resp 16   Ht 5\' 9"  (1.753 m)   Wt (!) 372 lb (168.7 kg)   SpO2 95%   BMI 54.93 kg/m   General Appearance: No distress obese. Neuro:without focal findings,  speech normal,  HEENT: PERRLA, EOM intact.  Mallampati 3. Pulmonary: normal breath sounds, No wheezing.  CardiovascularNormal S1,S2.  No m/r/g.   Abdomen: Benign, Soft, non-tender. Renal:  No costovertebral tenderness  GU:  No performed at this time. Endoc: No evident thyromegaly, no signs of acromegaly. Skin:   warm, no rashes, no ecchymosis  Extremities: normal, no cyanosis, clubbing.  Other findings:    LABORATORY PANEL:   CBC Recent Labs  Lab 12/12/17 1210  WBC 7.1  HGB 14.3  HCT 44.4  PLT 385.0   ------------------------------------------------------------------------------------------------------------------  Chemistries  Recent Labs  Lab 12/12/17 1210  NA 135  K 4.2  CL 99  CO2 30  GLUCOSE 306*  BUN 15  CREATININE 0.93  CALCIUM 9.1  AST 29  ALT 52  ALKPHOS 59  BILITOT 0.8   ------------------------------------------------------------------------------------------------------------------  Cardiac Enzymes No results for input(s): TROPONINI in the last 168  hours. ------------------------------------------------------------  RADIOLOGY:  Dg Chest 2 View  Result Date: 12/12/2017 CLINICAL DATA:  Shortness of breath and chest pain EXAM: CHEST - 2 VIEW COMPARISON:  April 11, 2010 FINDINGS: There Is mild interstitial edema. There are areas of atelectatic change in the left mid lung and right base. There is no consolidation. There is cardiomegaly with pulmonary venous hypertension. No adenopathy. No bone lesions. IMPRESSION: Cardiomegaly with pulmonary vascular congestion. Mild interstitial edema. There may be a degree of underlying congestive heart failure. No consolidation. Areas of mild atelectatic change bilaterally. Electronically Signed   By: Bretta Bang III M.D.   On: 12/12/2017 13:11       Thank  you for the consultation and for allowing [facility] to assist in  the care of your patient. Our recommendations are noted above.  Please contact us if we can be of further service.   Wells Guiles, M.D., F.C.C.P.  Board Certified in Internal Medicine, Pulmonary Medicine, Critical Care Medicine, and Sleep Medicine.  [facility] Office Number: [phone]   12/13/2017

## 2017-12-13 NOTE — Patient Instructions (Signed)

## 2017-12-14 ENCOUNTER — Other Ambulatory Visit: Payer: Self-pay

## 2017-12-14 ENCOUNTER — Inpatient Hospital Stay
Admission: EM | Admit: 2017-12-14 | Discharge: 2017-12-21 | DRG: 286 | Disposition: A | Discharge status: Other Acute Inpatient Hospital | Payer: 59 | Attending: Internal Medicine | Admitting: Internal Medicine

## 2017-12-14 ENCOUNTER — Encounter: Payer: Self-pay | Admitting: Internal Medicine

## 2017-12-14 ENCOUNTER — Emergency Department: Payer: 59

## 2017-12-14 ENCOUNTER — Encounter: Payer: Self-pay | Admitting: Emergency Medicine

## 2017-12-14 DIAGNOSIS — Z791 Long term (current) use of non-steroidal anti-inflammatories (NSAID): Secondary | ICD-10-CM

## 2017-12-14 DIAGNOSIS — Z9111 Patient's noncompliance with dietary regimen: Secondary | ICD-10-CM

## 2017-12-14 DIAGNOSIS — I471 Supraventricular tachycardia: Secondary | ICD-10-CM | POA: Diagnosis present

## 2017-12-14 DIAGNOSIS — R000 Tachycardia, unspecified: Secondary | ICD-10-CM | POA: Diagnosis not present

## 2017-12-14 DIAGNOSIS — Z716 Tobacco abuse counseling: Secondary | ICD-10-CM

## 2017-12-14 DIAGNOSIS — R079 Chest pain, unspecified: Secondary | ICD-10-CM | POA: Diagnosis not present

## 2017-12-14 DIAGNOSIS — Z5329 Procedure and treatment not carried out because of patient's decision for other reasons: Secondary | ICD-10-CM | POA: Diagnosis present

## 2017-12-14 DIAGNOSIS — Z6841 Body Mass Index (BMI) 40.0 and over, adult: Secondary | ICD-10-CM

## 2017-12-14 DIAGNOSIS — G4733 Obstructive sleep apnea (adult) (pediatric): Secondary | ICD-10-CM | POA: Diagnosis present

## 2017-12-14 DIAGNOSIS — K219 Gastro-esophageal reflux disease without esophagitis: Secondary | ICD-10-CM | POA: Diagnosis present

## 2017-12-14 DIAGNOSIS — Z7982 Long term (current) use of aspirin: Secondary | ICD-10-CM

## 2017-12-14 DIAGNOSIS — F17210 Nicotine dependence, cigarettes, uncomplicated: Secondary | ICD-10-CM | POA: Diagnosis present

## 2017-12-14 DIAGNOSIS — I456 Pre-excitation syndrome: Secondary | ICD-10-CM | POA: Diagnosis not present

## 2017-12-14 DIAGNOSIS — F1210 Cannabis abuse, uncomplicated: Secondary | ICD-10-CM | POA: Diagnosis present

## 2017-12-14 DIAGNOSIS — Z79899 Other long term (current) drug therapy: Secondary | ICD-10-CM

## 2017-12-14 DIAGNOSIS — I479 Paroxysmal tachycardia, unspecified: Secondary | ICD-10-CM | POA: Diagnosis present

## 2017-12-14 DIAGNOSIS — Z7151 Drug abuse counseling and surveillance of drug abuser: Secondary | ICD-10-CM

## 2017-12-14 DIAGNOSIS — I110 Hypertensive heart disease with heart failure: Secondary | ICD-10-CM | POA: Diagnosis present

## 2017-12-14 DIAGNOSIS — E118 Type 2 diabetes mellitus with unspecified complications: Secondary | ICD-10-CM

## 2017-12-14 DIAGNOSIS — I272 Pulmonary hypertension, unspecified: Secondary | ICD-10-CM | POA: Diagnosis present

## 2017-12-14 DIAGNOSIS — R0602 Shortness of breath: Secondary | ICD-10-CM | POA: Diagnosis not present

## 2017-12-14 DIAGNOSIS — Z7984 Long term (current) use of oral hypoglycemic drugs: Secondary | ICD-10-CM

## 2017-12-14 DIAGNOSIS — I5023 Acute on chronic systolic (congestive) heart failure: Secondary | ICD-10-CM | POA: Diagnosis present

## 2017-12-14 DIAGNOSIS — Z8249 Family history of ischemic heart disease and other diseases of the circulatory system: Secondary | ICD-10-CM

## 2017-12-14 DIAGNOSIS — I428 Other cardiomyopathies: Secondary | ICD-10-CM | POA: Diagnosis present

## 2017-12-14 DIAGNOSIS — E1165 Type 2 diabetes mellitus with hyperglycemia: Secondary | ICD-10-CM | POA: Diagnosis present

## 2017-12-14 DIAGNOSIS — IMO0002 Reserved for concepts with insufficient information to code with codable children: Secondary | ICD-10-CM

## 2017-12-14 DIAGNOSIS — R0789 Other chest pain: Secondary | ICD-10-CM | POA: Insufficient documentation

## 2017-12-14 DIAGNOSIS — Z833 Family history of diabetes mellitus: Secondary | ICD-10-CM

## 2017-12-14 HISTORY — DX: Pre-excitation syndrome: I45.6

## 2017-12-14 LAB — CBC WITH DIFFERENTIAL/PLATELET
Basophils Absolute: 0.1 10*3/uL (ref 0–0.1)
Basophils Relative: 1 %
EOS ABS: 0 10*3/uL (ref 0–0.7)
EOS PCT: 1 %
HCT: 42.4 % (ref 40.0–52.0)
Hemoglobin: 14.2 g/dL (ref 13.0–18.0)
LYMPHS ABS: 3.3 10*3/uL (ref 1.0–3.6)
Lymphocytes Relative: 40 %
MCH: 30 pg (ref 26.0–34.0)
MCHC: 33.6 g/dL (ref 32.0–36.0)
MCV: 89.3 fL (ref 80.0–100.0)
MONO ABS: 0.5 10*3/uL (ref 0.2–1.0)
MONOS PCT: 7 %
Neutro Abs: 4.2 10*3/uL (ref 1.4–6.5)
Neutrophils Relative %: 51 %
PLATELETS: 349 10*3/uL (ref 150–440)
RBC: 4.75 MIL/uL (ref 4.40–5.90)
RDW: 13.8 % (ref 11.5–14.5)
WBC: 8.1 10*3/uL (ref 3.8–10.6)

## 2017-12-14 LAB — COMPREHENSIVE METABOLIC PANEL
ALT: 57 U/L — ABNORMAL HIGH (ref 0–44)
ANION GAP: 9 (ref 5–15)
AST: 46 U/L — ABNORMAL HIGH (ref 15–41)
Albumin: 3.5 g/dL (ref 3.5–5.0)
Alkaline Phosphatase: 59 U/L (ref 38–126)
BUN: 21 mg/dL — ABNORMAL HIGH (ref 6–20)
CO2: 26 mmol/L (ref 22–32)
Calcium: 8.2 mg/dL — ABNORMAL LOW (ref 8.9–10.3)
Chloride: 103 mmol/L (ref 98–111)
Creatinine, Ser: 1.01 mg/dL (ref 0.61–1.24)
GFR calc non Af Amer: >60 mL/min (ref >60)
Glucose, Bld: 312 mg/dL — ABNORMAL HIGH (ref 70–99)
Potassium: 3.8 mmol/L (ref 3.5–5.1)
SODIUM: 138 mmol/L (ref 135–145)
Total Bilirubin: 0.8 mg/dL (ref 0.3–1.2)
Total Protein: 6.7 g/dL (ref 6.5–8.1)

## 2017-12-14 LAB — TROPONIN I: Troponin I: 0.03 ng/mL (ref <0.03)

## 2017-12-14 LAB — BRAIN NATRIURETIC PEPTIDE: B Natriuretic Peptide: 234 pg/mL — ABNORMAL HIGH (ref 0.0–100.0)

## 2017-12-14 MED ORDER — ADENOSINE 12 MG/4ML IV SOLN
INTRAVENOUS | Status: AC
  Filled 2017-12-14: qty 4

## 2017-12-14 MED ORDER — ALBUTEROL SULFATE (2.5 MG/3ML) 0.083% IN NEBU: 5.0000 mg | INHALATION_SOLUTION | Freq: Once | RESPIRATORY_TRACT | Status: DC

## 2017-12-14 MED ORDER — SODIUM CHLORIDE 0.9 % IV BOLUS
1000.0000 mL | Freq: Once | INTRAVENOUS | Status: AC
  Administered 2017-12-14: 1000 mL via INTRAVENOUS

## 2017-12-14 MED ORDER — ADENOSINE 6 MG/2ML IV SOLN
INTRAVENOUS | Status: AC
  Filled 2017-12-14: qty 2

## 2017-12-14 NOTE — ED Notes (Signed)
Pt arrived  To er  14 with hr 190  While starting iv  And  theraputic interventions pt conveted without any meds  Rate approx 100 -108  Remains on monitor

## 2017-12-14 NOTE — ED Notes (Signed)
Troponin level given to Dr Katharina Caper

## 2017-12-14 NOTE — ED Triage Notes (Addendum)
Pt presents to ED with sob and chest tightness. HR 190 in triage and pt immediately taken to room 14.

## 2017-12-14 NOTE — ED Provider Notes (Signed)
[facility] Emergency Department Provider Note ____________________________________________   First MD Initiated Contact with Patient 12/14/17 2239     (approximate)  I have reviewed the triage vital signs and the nursing notes.   HISTORY  Chief Complaint Shortness of Breath and Chest Pain    HPI Alvin Phillips is a 37 y.o. male with PMH as noted below who presents with weakness and shortness of breath, acute onset this evening while the patient was in his car after walking across a field at a sports event, associated with some palpitations, but not associated with chest pain.  Patient denies prior history of this symptom.  He states he recently saw a cardiologist and is being worked up for a heart arrhythmia.  Past Medical History:  Diagnosis Date  . Diabetes mellitus without complication (HCC) 07/10/13   referred to DSME, did not show (04/2014)  . GERD (gastroesophageal reflux disease)   . Hypertension   . Morbidly obese (HCC) 03/24/2014  . OSA (obstructive sleep apnea) 10/29/2013  . Wolff-Parkinson-White (WPW) syndrome     Patient Active Problem List   Diagnosis Date Noted  . Atypical chest pain 12/14/2017  . Morbid obesity (HCC) 12/14/2017  . Shortness of breath 12/12/2017  . Chest discomfort 12/12/2017  . Allergic dermatitis 10/26/2017  . Internal hordeolum of left eye 10/24/2016  . Nausea 05/13/2016  . WPW (Wolff-Parkinson-White syndrome) 05/13/2016  . Morbid obesity with BMI of 50.0-59.9, adult (HCC) 03/24/2014  . GERD (gastroesophageal reflux disease) 03/24/2014  . Essential hypertension 03/24/2014  . OSA (obstructive sleep apnea) 10/29/2013  . Plantar fasciitis, bilateral 10/29/2013  . Diabetes mellitus type 2, uncontrolled, with complications (HCC) 08/27/2013  . Microalbuminuria 08/27/2013  . Microscopic hematuria 07/22/2013    Past Surgical History:  Procedure Laterality Date  . FRACTURE SURGERY Left 2002   fractured arm  . HERNIA  REPAIR  12-16-13   umbilical    Prior to Admission medications   Medication Sig Start Date End Date Taking? Authorizing Provider  aspirin EC 81 MG tablet Take 1 tablet (81 mg total) by mouth daily. 12/12/17  Yes Eustaquio Boyden, MD  furosemide (LASIX) 20 MG tablet Take 1 tablet (20 mg total) by mouth daily as needed for fluid or edema. 12/12/17  Yes Eustaquio Boyden, MD  glucose blood (BAYER CONTOUR NEXT TEST) test strip Use as instructed 10/24/16  Yes Eustaquio Boyden, MD  ibuprofen (ADVIL,MOTRIN) 200 MG tablet Take 200 mg by mouth as needed.   Yes [provider]  Lancets Letta Pate ULTRASOFT) lancets Use as instructed 10/24/16  Yes Eustaquio Boyden, MD  lisinopril (PRINIVIL,ZESTRIL) 10 MG tablet Take 1 tablet (10 mg total) by mouth daily. 12/12/17  Yes Eustaquio Boyden, MD  metFORMIN (GLUCOPHAGE XR) 750 MG 24 hr tablet Take 1 tablet (750 mg total) by mouth daily with breakfast. 12/12/17  Yes Eustaquio Boyden, MD  pantoprazole (PROTONIX) 20 MG tablet Take 1 tablet (20 mg total) by mouth daily. Patient taking differently: Take 20 mg by mouth daily. Takes as needed 10/26/17 10/26/18 Yes Bedsole, Amy E, MD    Allergies Patient has no known allergies.  Family History  Problem Relation Age of Onset  . Diabetes Maternal Grandmother   . Heart attack Maternal Grandmother 80  . Diabetes Paternal Grandfather     Social History Social History   Tobacco Use  . Smoking status: Former Smoker    Packs/day: 0.25    Years: 15.00    Pack years: 3.75    Types: Cigarettes  Last attempt to quit: 2019    Years since quitting: 0.7  . Smokeless tobacco: Never Used  Substance Use Topics  . Alcohol use: Yes    Alcohol/week: 0.0 standard drinks    Comment: Occassional beer, shots of liquor  . Drug use: Yes    Frequency: 2.0 times per week    Types: Marijuana    Comment: 1 time per week; none in 4-6 months.    Review of Systems  Constitutional: No fever.  Positive for weakness. Eyes: No  redness. ENT: No sore throat. Cardiovascular: Denies chest pain. Respiratory: Positive for shortness of breath. Gastrointestinal: No vomiting.  Genitourinary: Negative for flank pain.  Musculoskeletal: Negative for back pain. Skin: Negative for rash. Neurological: Negative for headache.   ____________________________________________   PHYSICAL EXAM:  VITAL SIGNS: ED Triage Vitals  Enc Vitals Group     BP 12/14/17 2209 (!) 122/94     Pulse Rate 12/14/17 2209 (!) 190     Resp 12/14/17 2209 (!) 22     Temp 12/14/17 2209 97.7 F (36.5 C)     Temp Source 12/14/17 2209 Oral     SpO2 12/14/17 2209 96 %     Weight 12/14/17 2216 (!) 372 lb (168.7 kg)     Height 12/14/17 2216 5\' 10"  (1.778 m)     Head Circumference --      Peak Flow --      Pain Score 12/14/17 2212 3     Pain Loc --      Pain Edu? --      Excl. in GC? --     Constitutional: Alert and oriented.  Uncomfortable appearing. Eyes: Conjunctivae are normal.  Head: Atraumatic. Nose: No congestion/rhinnorhea. Mouth/Throat: Mucous membranes are moist.   Neck: Normal range of motion.  Cardiovascular: Tachycardic, regular rhythm. Grossly normal heart sounds.  Good peripheral circulation. Respiratory: Normal respiratory effort.  No retractions.  Decreased breath sounds bilaterally. Gastrointestinal: Soft and nontender. No distention.  Genitourinary: No flank tenderness. Musculoskeletal: No lower extremity edema.  Extremities warm and well perfused.  Neurologic:  Normal speech and language. No gross focal neurologic deficits are appreciated.  Skin:  Skin is warm and dry. No rash noted. Psychiatric: Mood and affect are normal. Speech and behavior are normal.  ____________________________________________   LABS (all labs ordered are listed, but only abnormal results are displayed)  Labs Reviewed  TROPONIN I - Abnormal; Notable for the following components:      Result Value   Troponin I 0.03 (*)    All other  components within normal limits  BRAIN NATRIURETIC PEPTIDE - Abnormal; Notable for the following components:   B Natriuretic Peptide 234.0 (*)    All other components within normal limits  COMPREHENSIVE METABOLIC PANEL - Abnormal; Notable for the following components:   Glucose, Bld 312 (*)    BUN 21 (*)    Calcium 8.2 (*)    AST 46 (*)    ALT 57 (*)    All other components within normal limits  CBC WITH DIFFERENTIAL/PLATELET   ____________________________________________  EKG  ED ECG REPORT I, Dionne Bucy, the attending physician, personally viewed and interpreted this ECG.  Date: 12/14/2017 EKG Time: 2213 Rate: 190 Rhythm: Narrow complex supraventricular tachycardia QRS Axis: Left axis Intervals: normal ST/T Wave abnormalities: LVH with repolarization abnormality Narrative Interpretation: SVT with no evidence of acute ischemia  ED ECG REPORT I, Dionne Bucy, the attending physician, personally viewed and interpreted this ECG.  Date: 12/14/2017 EKG Time:  2229 Rate: 116 Rhythm: Sinus tachycardia QRS Axis: normal Intervals: Borderline wide QRS, WPW ST/T Wave abnormalities: normal Narrative Interpretation: WPW with no evidence of acute ischemia   ____________________________________________  RADIOLOGY  CXR: Cardiomegaly with diffuse pulmonary interstitial edema  ____________________________________________   PROCEDURES  Procedure(s) performed: No  Procedures  Critical Care performed: Yes  CRITICAL CARE Performed by: Dionne Bucy   Total critical care time: 40 minutes  Critical care time was exclusive of separately billable procedures and treating other patients.  Critical care was necessary to treat or prevent imminent or life-threatening deterioration.  Critical care was time spent personally by me on the following activities: development of treatment plan with patient and/or surrogate as well as nursing, discussions with  consultants, evaluation of patient's response to treatment, examination of patient, obtaining history from patient or surrogate, ordering and performing treatments and interventions, ordering and review of laboratory studies, ordering and review of radiographic studies, pulse oximetry and re-evaluation of patient's condition. ____________________________________________   INITIAL IMPRESSION / ASSESSMENT AND PLAN / ED COURSE  Pertinent labs & imaging results that were available during my care of the patient were reviewed by me and considered in my medical decision making (see chart for details).  37 year old male with history of WPW, obesity, hypertension, and other PMH as noted above presents with weakness and shortness of breath acute onset this evening.  On ED arrival, the patient was noted to be tachycardic to the 190s although his other vital signs were normal.  The remainder of his exam is as described above.  EKG showed narrow complex supraventricular type tachycardia.  I reviewed the past medical records in epic; the patient has had no recent prior ED visits.  He saw Dr. Okey Dupre from cardiology yesterday.  Per the note, the patient was diagnosed with possible CHF and started on furosemide.  He was noted to have WPW on his EKG but has not had similar episodes of acute tachycardia to today.  Initially plan to give IV antiarrhythmic, however when the patient saw one of the needles being used he apparently had a vasovagal reaction and his tachycardia spontaneously resolved, and he returned to sinus rhythm with a borderline wide QRS.  We will obtain labs and x-ray and reassess.  I suspected that the patient might be intravascularly volume depleted given his exam, so I ordered fluids.  After the additional work-up I will consult cardiology.  ----------------------------------------- 12:30 AM on 12/15/2017 -----------------------------------------  The patient's labs are unremarkable except for  minimally elevated troponin.  He continues to be asymptomatic and comfortable.  I consulted Dr. Eden Emms from cardiology who recommended admitting the patient, starting him on Lopressor, and getting an echocardiogram in the morning.  I counseled the patient on the results of the work-up, and I signed him out to the hospitalist Dr. Caryn Bee. ____________________________________________   FINAL CLINICAL IMPRESSION(S) / ED DIAGNOSES  Final diagnoses:  Wolff-Parkinson-White syndrome  Tachyarrhythmia      NEW MEDICATIONS STARTED DURING THIS VISIT:  New Prescriptions   No medications on file     Note:  This document was prepared using Dragon voice recognition software and may include unintentional dictation errors.     Dionne Bucy, MD 12/15/17 0030

## 2017-12-14 NOTE — ED Notes (Signed)
REcent medication changes

## 2017-12-15 ENCOUNTER — Inpatient Hospital Stay: Payer: 59

## 2017-12-15 ENCOUNTER — Encounter: Payer: Self-pay | Admitting: Internal Medicine

## 2017-12-15 ENCOUNTER — Inpatient Hospital Stay (HOSPITAL_COMMUNITY)
Admit: 2017-12-15 | Discharge: 2017-12-15 | Disposition: A | Discharge status: Home / Self Care | Payer: 59 | Attending: Internal Medicine | Admitting: Internal Medicine

## 2017-12-15 ENCOUNTER — Other Ambulatory Visit: Payer: Self-pay

## 2017-12-15 DIAGNOSIS — K219 Gastro-esophageal reflux disease without esophagitis: Secondary | ICD-10-CM | POA: Diagnosis present

## 2017-12-15 DIAGNOSIS — I340 Nonrheumatic mitral (valve) insufficiency: Secondary | ICD-10-CM | POA: Diagnosis not present

## 2017-12-15 DIAGNOSIS — I471 Supraventricular tachycardia: Secondary | ICD-10-CM | POA: Diagnosis not present

## 2017-12-15 DIAGNOSIS — G4733 Obstructive sleep apnea (adult) (pediatric): Secondary | ICD-10-CM | POA: Diagnosis present

## 2017-12-15 DIAGNOSIS — Z833 Family history of diabetes mellitus: Secondary | ICD-10-CM | POA: Diagnosis not present

## 2017-12-15 DIAGNOSIS — I472 Ventricular tachycardia: Secondary | ICD-10-CM | POA: Diagnosis not present

## 2017-12-15 DIAGNOSIS — J810 Acute pulmonary edema: Secondary | ICD-10-CM

## 2017-12-15 DIAGNOSIS — R0602 Shortness of breath: Secondary | ICD-10-CM | POA: Diagnosis not present

## 2017-12-15 DIAGNOSIS — E1165 Type 2 diabetes mellitus with hyperglycemia: Secondary | ICD-10-CM | POA: Diagnosis present

## 2017-12-15 DIAGNOSIS — Z7982 Long term (current) use of aspirin: Secondary | ICD-10-CM | POA: Diagnosis not present

## 2017-12-15 DIAGNOSIS — Z79899 Other long term (current) drug therapy: Secondary | ICD-10-CM | POA: Diagnosis not present

## 2017-12-15 DIAGNOSIS — I5023 Acute on chronic systolic (congestive) heart failure: Secondary | ICD-10-CM | POA: Diagnosis not present

## 2017-12-15 DIAGNOSIS — I428 Other cardiomyopathies: Secondary | ICD-10-CM | POA: Diagnosis not present

## 2017-12-15 DIAGNOSIS — Z8249 Family history of ischemic heart disease and other diseases of the circulatory system: Secondary | ICD-10-CM | POA: Diagnosis not present

## 2017-12-15 DIAGNOSIS — R000 Tachycardia, unspecified: Secondary | ICD-10-CM

## 2017-12-15 DIAGNOSIS — R079 Chest pain, unspecified: Secondary | ICD-10-CM | POA: Diagnosis not present

## 2017-12-15 DIAGNOSIS — I456 Pre-excitation syndrome: Principal | ICD-10-CM

## 2017-12-15 DIAGNOSIS — Z7984 Long term (current) use of oral hypoglycemic drugs: Secondary | ICD-10-CM | POA: Diagnosis not present

## 2017-12-15 DIAGNOSIS — F17210 Nicotine dependence, cigarettes, uncomplicated: Secondary | ICD-10-CM | POA: Diagnosis present

## 2017-12-15 DIAGNOSIS — Z5329 Procedure and treatment not carried out because of patient's decision for other reasons: Secondary | ICD-10-CM | POA: Diagnosis present

## 2017-12-15 DIAGNOSIS — I110 Hypertensive heart disease with heart failure: Secondary | ICD-10-CM | POA: Diagnosis present

## 2017-12-15 DIAGNOSIS — Z6841 Body Mass Index (BMI) 40.0 and over, adult: Secondary | ICD-10-CM | POA: Diagnosis not present

## 2017-12-15 DIAGNOSIS — F1210 Cannabis abuse, uncomplicated: Secondary | ICD-10-CM | POA: Diagnosis present

## 2017-12-15 DIAGNOSIS — I10 Essential (primary) hypertension: Secondary | ICD-10-CM | POA: Diagnosis not present

## 2017-12-15 DIAGNOSIS — Z9111 Patient's noncompliance with dietary regimen: Secondary | ICD-10-CM | POA: Diagnosis not present

## 2017-12-15 DIAGNOSIS — Z791 Long term (current) use of non-steroidal anti-inflammatories (NSAID): Secondary | ICD-10-CM | POA: Diagnosis not present

## 2017-12-15 DIAGNOSIS — R609 Edema, unspecified: Secondary | ICD-10-CM | POA: Diagnosis not present

## 2017-12-15 DIAGNOSIS — Z7151 Drug abuse counseling and surveillance of drug abuser: Secondary | ICD-10-CM | POA: Diagnosis not present

## 2017-12-15 DIAGNOSIS — Z716 Tobacco abuse counseling: Secondary | ICD-10-CM | POA: Diagnosis not present

## 2017-12-15 LAB — PHOSPHORUS: Phosphorus: 3.9 mg/dL (ref 2.5–4.6)

## 2017-12-15 LAB — GLUCOSE, CAPILLARY
GLUCOSE-CAPILLARY: 249 mg/dL — AB (ref 70–99)
GLUCOSE-CAPILLARY: 248 mg/dL — AB (ref 70–99)
Glucose-Capillary: 236 mg/dL — ABNORMAL HIGH (ref 70–99)
Glucose-Capillary: 225 mg/dL — ABNORMAL HIGH (ref 70–99)
Glucose-Capillary: 251 mg/dL — ABNORMAL HIGH (ref 70–99)

## 2017-12-15 LAB — TROPONIN I
Troponin I: 0.03 ng/mL (ref <0.03)
Troponin I: 0.03 ng/mL (ref <0.03)

## 2017-12-15 LAB — ECHOCARDIOGRAM COMPLETE
HEIGHTINCHES: 70 in
WEIGHTICAEL: 5952 [oz_av]

## 2017-12-15 LAB — MAGNESIUM: MAGNESIUM: 2 mg/dL (ref 1.7–2.4)

## 2017-12-15 LAB — LACTIC ACID, PLASMA: Lactic Acid, Venous: 1 mmol/L (ref 0.5–1.9)

## 2017-12-15 MED ORDER — NITROGLYCERIN 0.4 MG SL SUBL: 0.4000 mg | SUBLINGUAL_TABLET | SUBLINGUAL | Status: DC | PRN

## 2017-12-15 MED ORDER — INSULIN ASPART 100 UNIT/ML ~~LOC~~ SOLN
0.0000 [IU] | Freq: Every day | SUBCUTANEOUS | Status: DC
  Administered 2017-12-15 – 2017-12-16 (×2): 3 [IU] via SUBCUTANEOUS
  Administered 2017-12-17 – 2017-12-18 (×2): 2 [IU] via SUBCUTANEOUS
  Administered 2017-12-19 – 2017-12-20 (×2): 3 [IU] via SUBCUTANEOUS
  Filled 2017-12-15 (×6): qty 1

## 2017-12-15 MED ORDER — POTASSIUM CHLORIDE CRYS ER 20 MEQ PO TBCR
40.0000 meq | EXTENDED_RELEASE_TABLET | Freq: Once | ORAL | Status: AC
  Administered 2017-12-15: 40 meq via ORAL
  Filled 2017-12-15: qty 2

## 2017-12-15 MED ORDER — METOPROLOL TARTRATE 50 MG PO TABS: 50.0000 mg | ORAL_TABLET | Freq: Two times a day (BID) | ORAL | Status: DC

## 2017-12-15 MED ORDER — FLECAINIDE ACETATE 100 MG PO TABS: 100.0000 mg | ORAL_TABLET | Freq: Two times a day (BID) | ORAL | Status: DC

## 2017-12-15 MED ORDER — MORPHINE SULFATE (PF) 2 MG/ML IV SOLN: 2.0000 mg | INTRAVENOUS | Status: DC | PRN

## 2017-12-15 MED ORDER — BISACODYL 5 MG PO TBEC
5.0000 mg | DELAYED_RELEASE_TABLET | Freq: Every day | ORAL | Status: DC | PRN
  Filled 2017-12-15: qty 1

## 2017-12-15 MED ORDER — INSULIN ASPART 100 UNIT/ML ~~LOC~~ SOLN
0.0000 [IU] | Freq: Three times a day (TID) | SUBCUTANEOUS | Status: DC
  Administered 2017-12-15: 7 [IU] via SUBCUTANEOUS
  Administered 2017-12-16: 4 [IU] via SUBCUTANEOUS
  Administered 2017-12-16: 7 [IU] via SUBCUTANEOUS
  Administered 2017-12-16: 11 [IU] via SUBCUTANEOUS
  Administered 2017-12-17: 7 [IU] via SUBCUTANEOUS
  Administered 2017-12-17 (×2): 11 [IU] via SUBCUTANEOUS
  Administered 2017-12-18 – 2017-12-19 (×4): 7 [IU] via SUBCUTANEOUS
  Administered 2017-12-19: 11 [IU] via SUBCUTANEOUS
  Administered 2017-12-20 (×3): 7 [IU] via SUBCUTANEOUS
  Administered 2017-12-21 (×2): 11 [IU] via SUBCUTANEOUS
  Filled 2017-12-15 (×17): qty 1

## 2017-12-15 MED ORDER — FUROSEMIDE 10 MG/ML IJ SOLN
40.0000 mg | Freq: Once | INTRAMUSCULAR | Status: DC
  Filled 2017-12-15: qty 4

## 2017-12-15 MED ORDER — ASPIRIN 81 MG PO CHEW
81.0000 mg | CHEWABLE_TABLET | Freq: Every day | ORAL | Status: DC
  Administered 2017-12-16 – 2017-12-21 (×5): 81 mg via ORAL
  Filled 2017-12-15 (×5): qty 1

## 2017-12-15 MED ORDER — METOPROLOL SUCCINATE ER 50 MG PO TB24
50.0000 mg | ORAL_TABLET | Freq: Every day | ORAL | Status: DC
  Administered 2017-12-15 – 2017-12-21 (×7): 50 mg via ORAL
  Filled 2017-12-15 (×6): qty 1

## 2017-12-15 MED ORDER — ACETAMINOPHEN 325 MG PO TABS
650.0000 mg | ORAL_TABLET | ORAL | Status: DC | PRN
  Administered 2017-12-17 – 2017-12-18 (×2): 650 mg via ORAL
  Filled 2017-12-15 (×2): qty 2

## 2017-12-15 MED ORDER — IOPAMIDOL (ISOVUE-370) INJECTION 76%
100.0000 mL | Freq: Once | INTRAVENOUS | Status: AC | PRN
  Administered 2017-12-15: 100 mL via INTRAVENOUS

## 2017-12-15 MED ORDER — ENOXAPARIN SODIUM 40 MG/0.4ML ~~LOC~~ SOLN
40.0000 mg | Freq: Two times a day (BID) | SUBCUTANEOUS | Status: DC
  Administered 2017-12-15 – 2017-12-17 (×4): 40 mg via SUBCUTANEOUS
  Filled 2017-12-15 (×3): qty 0.4

## 2017-12-15 MED ORDER — ONDANSETRON HCL 4 MG/2ML IJ SOLN: 4.0000 mg | Freq: Four times a day (QID) | INTRAMUSCULAR | Status: DC | PRN

## 2017-12-15 MED ORDER — FUROSEMIDE 10 MG/ML IJ SOLN
40.0000 mg | Freq: Two times a day (BID) | INTRAMUSCULAR | Status: DC
  Administered 2017-12-15 – 2017-12-17 (×4): 40 mg via INTRAVENOUS
  Filled 2017-12-15 (×3): qty 4

## 2017-12-15 MED ORDER — FUROSEMIDE 40 MG PO TABS: 40.0000 mg | ORAL_TABLET | Freq: Every day | ORAL | Status: DC

## 2017-12-15 MED ORDER — LISINOPRIL 10 MG PO TABS: 10.0000 mg | ORAL_TABLET | Freq: Every day | ORAL | Status: DC

## 2017-12-15 MED ORDER — LOSARTAN POTASSIUM 50 MG PO TABS
25.0000 mg | ORAL_TABLET | Freq: Every day | ORAL | Status: DC
  Administered 2017-12-15: 25 mg via ORAL
  Filled 2017-12-15: qty 1

## 2017-12-15 MED ORDER — SENNOSIDES-DOCUSATE SODIUM 8.6-50 MG PO TABS: 1.0000 | ORAL_TABLET | Freq: Every evening | ORAL | Status: DC | PRN

## 2017-12-15 NOTE — ED Notes (Signed)
Pt calls out and family asks why patient is not upstairs. Again, I explain hold in ED and that upstairs is full. Pt is eating. Pt and family think pt's admission is on hold until pt transfers to an inpatient room. Explained pt's care is the same, just a different room. Pt and family have great difficulty understanding this. Paged admitting physician as pt continues to stat

## 2017-12-15 NOTE — ED Notes (Signed)
Report to Ewelina Naves, RN

## 2017-12-15 NOTE — Progress Notes (Signed)
[facility] - [place] [facility]   PATIENT NAME: Alvin Phillips    MR#:  [id number]  DATE OF BIRTH:  26-Dec-1980  SUBJECTIVE:  CHIEF COMPLAINT: Patient is short of breath but denies any chest pain  REVIEW OF SYSTEMS:  CONSTITUTIONAL: No fever, fatigue or weakness.  EYES: No blurred or double vision.  EARS, NOSE, AND THROAT: No tinnitus or ear pain.  RESPIRATORY: No cough, reporting worsening of shortness of breath lately, denies wheezing or hemoptysis.  CARDIOVASCULAR: No chest pain, orthopnea, edema.  GASTROINTESTINAL: No nausea, vomiting, diarrhea or abdominal pain.  GENITOURINARY: No dysuria, hematuria.  ENDOCRINE: No polyuria, nocturia,  HEMATOLOGY: No anemia, easy bruising or bleeding SKIN: No rash or lesion. MUSCULOSKELETAL: No joint pain or arthritis.   NEUROLOGIC: No tingling, numbness, weakness.  PSYCHIATRY: No anxiety or depression.   DRUG ALLERGIES:  No Known Allergies  VITALS:  Blood pressure (!) 136/106, pulse 96, temperature 97.7 F (36.5 C), temperature source Oral, resp. rate (!) 22, height 5\' 10"  (1.778 m), weight (!) 168.7 kg, SpO2 98 %.  PHYSICAL EXAMINATION:  GENERAL:  37 y.o.-year-old patient lying in the bed with no acute distress.  Morbidly obese EYES: Pupils equal, round, reactive to light and accommodation. No scleral icterus. Extraocular muscles intact.  HEENT: Head atraumatic, normocephalic. Oropharynx and nasopharynx clear.  NECK:  Supple, no jugular venous distention. No thyroid enlargement, no tenderness.  LUNGS: Diminished breath sounds bilaterally, no wheezing, patient has rales,rhonchi o,no  crepitation. No use of accessory muscles of respiration.  CARDIOVASCULAR: S1, S2 normal. No murmurs, rubs, or gallops.  ABDOMEN: Soft, nontender, nondistended. Bowel sounds present. No organomegaly or mass.  EXTREMITIES: No pedal edema, cyanosis, or clubbing.  NEUROLOGIC: Cranial nerves II through XII are intact. Muscle strength 5/5  in all extremities. Sensation intact. Gait not checked.  PSYCHIATRIC: The patient is alert and oriented x 3.  SKIN: No obvious rash, lesion, or ulcer.    LABORATORY PANEL:   CBC Recent Labs  Lab 12/14/17 2228  WBC 8.1  HGB 14.2  HCT 42.4  PLT 349   ------------------------------------------------------------------------------------------------------------------  Chemistries  Recent Labs  Lab 12/14/17 2228 12/15/17 0500  NA 138  --   K 3.8  --   CL 103  --   CO2 26  --   GLUCOSE 312*  --   BUN 21*  --   CREATININE 1.01  --   CALCIUM 8.2*  --   MG  --  2.0  AST 46*  --   ALT 57*  --   ALKPHOS 59  --   BILITOT 0.8  --    ------------------------------------------------------------------------------------------------------------------  Cardiac Enzymes Recent Labs  Lab 12/15/17 0500  TROPONINI 0.03*   ------------------------------------------------------------------------------------------------------------------  RADIOLOGY:  Ct Angio Chest Pe W Or Wo Contrast  Result Date: 12/15/2017 CLINICAL DATA:  37 year old male with shortness of breath. EXAM: CT ANGIOGRAPHY CHEST WITH CONTRAST TECHNIQUE: Multidetector CT imaging of the chest was performed using the standard protocol during bolus administration of intravenous contrast. Multiplanar CT image reconstructions and MIPs were obtained to evaluate the vascular anatomy. CONTRAST:  ISOVUE-370 IOPAMIDOL (ISOVUE-370) INJECTION 76% COMPARISON:  Chest radiograph dated 12/14/2017 FINDINGS: Cardiovascular: There is mild cardiomegaly. Retrograde flow of contrast from the right atrium into the IVC consistent with right heart dysfunction. No pericardial effusion. The thoracic aorta is unremarkable as visualized. There is no CT evidence of pulmonary embolism. Mediastinum/Nodes: Mildly enlarged right hilar lymph nodes measure up to 14 mm in short axis. The esophagus  and the thyroid gland are grossly unremarkable. No mediastinal  fluid collection. Lungs/Pleura: There is interstitial prominence and interlobular septal thickening predominantly involving the lower lobes most consistent with edema. Minimal bibasilar linear atelectatic changes noted. No lobar consolidation. There is no pleural effusion or pneumothorax. The central airways are patent. Upper Abdomen: Enlarged appearance of the liver. Partially visualized 2 cm rounded lesion in the left adrenal gland, indeterminate but corresponds to the lesion seen on the prior CT of 07/09/2013. Musculoskeletal: No chest wall abnormality. No acute or significant osseous findings. Review of the MIP images confirms the above findings. IMPRESSION: 1. No CT evidence of pulmonary embolism. 2. Cardiomegaly with findings of CHF and interstitial edema. 3. Mildly enlarged right hilar lymph nodes of indeterminate etiology, likely reactive. Clinical correlation and attention on follow-up imaging recommended. Electronically Signed   By: Elgie Collard M.D.   On: 12/15/2017 02:39   Dg Chest Portable 1 View  Result Date: 12/14/2017 CLINICAL DATA:  Initial evaluation for acute shortness of breath and chest pain. EXAM: PORTABLE CHEST 1 VIEW COMPARISON:  Prior radiograph from 12/12/2017. FINDINGS: Moderate cardiomegaly, stable. Mediastinal silhouette within normal limits. Lungs mildly hypoinflated. Diffuse vascular congestion with interstitial prominence, suggesting mild diffuse pulmonary interstitial edema. Superimposed linear scarring and/or atelectasis at the peripheral mid left lung. No consolidative opacity. No appreciable pleural effusion. No pneumothorax. No acute osseous abnormality. IMPRESSION: Cardiomegaly with mild diffuse pulmonary interstitial edema, suggesting CHF. Electronically Signed   By: Rise Mu M.D.   On: 12/14/2017 22:45    EKG:   Orders placed or performed during the hospital encounter of 12/14/17  . EKG 12-Lead  . EKG 12-Lead  . ED EKG  . ED EKG  . EKG 12-lead     ASSESSMENT AND PLAN:     1.  Narrow complex tachycardia with a history of  WPW Patient was seen by cardiology and case discussed with Dr. Graciela Husbands Recommending to start the patient on metoprolol 50 mg twice a day and flecainide 100 mg twice a day Echocardiogram is done results are pending-preliminary report 20% ejection fraction Recommending outpatient follow-up with PCP in [facility] Dr. Graciela Husbands for possible ablation after discharge  2.  Interstitial pulmonary edema Lasix 40 mg IV given and recommending 40 mg of Lasix once daily as renal function is slightly worse Reassess the patient tomorrow and if necessary consider IV Lasix Daily weight monitoring intake and output and strict DASH diet  Seems to be noncompliant with the diet dietary consult placed  3.   diabetes mellitus Patient is refusing insulin as reported by the nurse reinforced the importance of compliance Check A1c  4.  Obstructive sleep apnea CPAP nightly Sleep study is pending which is scheduled as an outpatient  5.  Morbid obesity Encouraged diet and exercise   6.  Chest pain acute MI ruled out with negative troponins    All the records are reviewed and case discussed with Care Management/Social Workerr. Management plans discussed with the patient, family and they are in agreement.  CODE STATUS: fc  TOTAL TIME TAKING CARE OF THIS PATIENT: 39 minutes.   POSSIBLE D/C IN 2 DAYS, DEPENDING ON CLINICAL CONDITION.  Note: This dictation was prepared with Dragon dictation along with smaller phrase technology. Any transcriptional errors that result from this process are unintentional.   Ramonita Lab M.D on 12/15/2017 at 2:23 PM  Between 7am to 6pm - Pager - [phone] After 6pm go to [email or web address] - password [facility]  Office  [phone]  CC: Primary care physician; Eustaquio Boyden, MD

## 2017-12-15 NOTE — Progress Notes (Signed)
*  PRELIMINARY RESULTS* Echocardiogram 2D Echocardiogram has been performed.  Cristela Blue 12/15/2017, 2:12 PM

## 2017-12-15 NOTE — ED Notes (Signed)
Spoke with echo tech for ETA of testing and informed pt and family echo states it will be several more hours. Per hospitalist, cardiology will consult post echo. Pt verbalizes understanding.

## 2017-12-15 NOTE — ED Notes (Signed)
Call to lab and they will come draw admission lab orders

## 2017-12-15 NOTE — ED Notes (Signed)
Pt refuses insulin, stating "I have been poked with needles all night and for nothing cause you cant even get me a room." I have explained he is holding in ER and also provided education on managing diabetes. Pt has two cans of regular sugar filled soda he has just consumed. Explained he should not be drinking this. Pt states he doesn't care and he doesn't want insulin.

## 2017-12-15 NOTE — ED Notes (Signed)
Surgery Provider at bedside. 

## 2017-12-15 NOTE — Consult Note (Addendum)
Cardiology Consultation:   Patient ID: Alvin Phillips MRN: [id number]; DOB: [birth year]  Admit date: 12/14/2017 Date of Consult: 12/15/2017  Primary Care Provider: Eustaquio Boyden, MD Primary Cardiologist: new to [facility] Consult request by Dr. Lyn Phillips Reason for consult: WPW, tachycardia   Patient Profile:   Alvin Phillips is a 37 y.o. male with a hx of morbid obesity, diabetes, WPW who is being seen today for the evaluation of WPW , narrow complex tachycardia, shortness of breath  History of Present Illness:   Alvin Phillips reports having 2 to 3 weeks of paroxysmal tachycardia Typically will last 30 minutes on average, episode yesterday lasting 1 hour He presented to the emergency room for further evaluation, shortness of breath with his tachycardia.  Recently seen in cardiology clinic 2 days ago by Alvin Phillips, echocardiogram was ordered and he was referred to EP. Echo has not been completed yet but will be done in the hospital  Currently feels well, tired, short of breath, sleepy He reports he is scheduled to have sleep study in the near future  Girlfriend by the bedside Reports he has been compliant with his Lasix but taking it only as needed Patient reports lower extremity swelling  Past Medical History:  Diagnosis Date  . Diabetes mellitus without complication (HCC) 07/10/13   referred to DSME, did not show (04/2014)  . GERD (gastroesophageal reflux disease)   . Hypertension   . Morbidly obese (HCC) 03/24/2014  . OSA (obstructive sleep apnea) 10/29/2013  . Wolff-Parkinson-White (WPW) syndrome     Past Surgical History:  Procedure Laterality Date  . FRACTURE SURGERY Left 2002   fractured arm  . HERNIA REPAIR  12-16-13   umbilical     Home Medications:  Prior to Admission medications   Medication Sig Start Date End Date Taking? Authorizing Provider  aspirin EC 81 MG tablet Take 1 tablet (81 mg total) by mouth daily. 12/12/17  Yes Alvin Boyden, MD  furosemide (LASIX) 20 MG  tablet Take 1 tablet (20 mg total) by mouth daily as needed for fluid or edema. 12/12/17  Yes Alvin Boyden, MD  glucose blood (BAYER CONTOUR NEXT TEST) test strip Use as instructed 10/24/16  Yes Alvin Boyden, MD  ibuprofen (ADVIL,MOTRIN) 200 MG tablet Take 200 mg by mouth as needed.   Yes [provider]  Lancets Alvin Phillips) lancets Use as instructed 10/24/16  Yes Alvin Boyden, MD  lisinopril (PRINIVIL,ZESTRIL) 10 MG tablet Take 1 tablet (10 mg total) by mouth daily. 12/12/17  Yes Alvin Boyden, MD  metFORMIN (GLUCOPHAGE XR) 750 MG 24 hr tablet Take 1 tablet (750 mg total) by mouth daily with breakfast. 12/12/17  Yes Alvin Boyden, MD  pantoprazole (PROTONIX) 20 MG tablet Take 1 tablet (20 mg total) by mouth daily. Patient taking differently: Take 20 mg by mouth daily. Takes as needed 10/26/17 10/26/18 Yes Bedsole, Amy E, MD    Inpatient Medications: Scheduled Meds: . aspirin  81 mg Oral Daily  . enoxaparin (LOVENOX) injection  40 mg Subcutaneous Q12H  . flecainide  100 mg Oral Q12H  . furosemide  40 mg Intravenous Once  . [START ON 12/16/2017] furosemide  40 mg Oral Daily  . insulin aspart  0-20 Units Subcutaneous TID WC  . insulin aspart  0-5 Units Subcutaneous QHS  . lisinopril  10 mg Oral Daily  . metoprolol tartrate  50 mg Oral BID   Continuous Infusions:  PRN Meds: acetaminophen, bisacodyl, morphine injection, nitroGLYCERIN, ondansetron (ZOFRAN) IV, senna-docusate  Allergies:  No Known Allergies  Social History:   Social History   Socioeconomic History  . Marital status: Single    Spouse name: Not on file  . Number of children: 2  . Years of education: Not on file  . Highest education level: Not on file  Occupational History  . Occupation: Advertising account executive: SELF-EMPLOYED    Comment: [employer]  Social Needs  . Financial resource strain: Not on file  . Food insecurity:    Worry: Not on file    Inability: Not  on file  . Transportation needs:    Medical: Not on file    Non-medical: Not on file  Tobacco Use  . Smoking status: Former Smoker    Packs/day: 0.25    Years: 15.00    Pack years: 3.75    Types: Cigarettes    Last attempt to quit: 2019    Years since quitting: 0.7  . Smokeless tobacco: Never Used  Substance and Sexual Activity  . Alcohol use: Yes    Alcohol/week: 0.0 standard drinks    Comment: Occassional beer, shots of liquor  . Drug use: Yes    Frequency: 2.0 times per week    Types: Marijuana    Comment: 1 time per week; none in 4-6 months.  . Sexual activity: Not on file  Lifestyle  . Physical activity:    Days per week: Not on file    Minutes per session: Not on file  . Stress: Not on file  Relationships  . Social connections:    Talks on phone: Not on file    Gets together: Not on file    Attends religious service: Not on file    Active member of club or organization: Not on file    Attends meetings of clubs or organizations: Not on file    Relationship status: Not on file  . Intimate partner violence:    Fear of current or ex partner: Not on file    Emotionally abused: Not on file    Physically abused: Not on file    Forced sexual activity: Not on file  Other Topics Concern  . Not on file  Social History Narrative   Lives with girlfriend   Alvin Phillips grew up in [place]. He is currently living in [place]. He has a daughter and son Alvin Phillips, Alvin Phillips). He breeds dogs Medical sales representative).    Family History:    Family History  Problem Relation Age of Onset  . Diabetes Maternal Grandmother   . Heart attack Maternal Grandmother 80  . Diabetes Paternal Grandfather      ROS:  Please see the history of present illness.  Review of Systems  Constitutional: Negative.   Respiratory: Positive for shortness of breath.   Cardiovascular: Positive for palpitations and leg swelling.       Tachycardia  Gastrointestinal: Negative.   Musculoskeletal: Negative.     Neurological: Negative.   Psychiatric/Behavioral: Negative.   All other systems reviewed and are negative.    Physical Exam/Data:   Vitals:   12/15/17 0030 12/15/17 0230 12/15/17 0300 12/15/17 1231  BP: 122/84 (!) 117/91 130/83 (!) 136/106  Pulse: 70 96    Resp: (!) 34 (!) 40 (!) 22   Temp:      TempSrc:      SpO2: 97% 98%    Weight:      Height:       No intake or output data in the 24 hours ending 12/15/17  1351 Filed Weights   12/14/17 2216  Weight: (!) 168.7 kg   Body mass index is 53.38 kg/m. General:  Well nourished, well developed, in no acute distress, morbidly obese HEENT: normal Lymph: no adenopathy Neck: no JVD Endocrine:  No thryomegaly Vascular: No carotid bruits; FA pulses 2+ bilaterally without bruits  Cardiac:  normal S1, S2; RRR; no murmur  Lungs:  clear to auscultation bilaterally, no wheezing, rhonchi or rales  Abd: soft, nontender, no hepatomegaly  Ext: no edema Musculoskeletal:  No deformities, BUE and BLE strength normal and equal Skin: warm and dry  Neuro:  CNs 2-12 intact, no focal abnormalities noted Psych:  Normal affect   EKG:  The EKG was personally reviewed and demonstrates: Narrow complex tachycardia rate 190 bpm Telemetry:  Telemetry was personally reviewed and demonstrates: Normal sinus rhythm  Relevant CV Studies:  Echocardiogram pending  Laboratory Data:  Chemistry Recent Labs  Lab 12/12/17 1210 12/14/17 2228  NA 135 138  K 4.2 3.8  CL 99 103  CO2 30 26  GLUCOSE 306* 312*  BUN 15 21*  CREATININE 0.93 1.01  CALCIUM 9.1 8.2*  GFRNONAA  --  >60  GFRAA  --  >60  ANIONGAP  --  9    Recent Labs  Lab 12/12/17 1210 12/14/17 2228  PROT 7.1 6.7  ALBUMIN 3.8 3.5  AST 29 46*  ALT 52 57*  ALKPHOS 59 59  BILITOT 0.8 0.8   Hematology Recent Labs  Lab 12/12/17 1210 12/14/17 2228  WBC 7.1 8.1  RBC 4.93 4.75  HGB 14.3 14.2  HCT 44.4 42.4  MCV 90.1 89.3  MCH  --  30.0  MCHC 32.1 33.6  RDW 13.7 13.8  PLT 385.0  349   Cardiac Enzymes Recent Labs  Lab 12/14/17 2228 12/15/17 0254 12/15/17 0500  TROPONINI 0.03* 0.03* 0.03*   No results for input(s): TROPIPOC in the last 168 hours.  BNP Recent Labs  Lab 12/12/17 1210 12/14/17 2228  BNP  --  234.0*  PROBNP 206.0*  --     DDimer No results for input(s): DDIMER in the last 168 hours.  Radiology/Studies:  Dg Chest 2 View  Result Date: 12/12/2017 CLINICAL DATA:  Shortness of breath and chest pain EXAM: CHEST - 2 VIEW COMPARISON:  April 11, 2010 FINDINGS: There Is mild interstitial edema. There are areas of atelectatic change in the left mid lung and right base. There is no consolidation. There is cardiomegaly with pulmonary venous hypertension. No adenopathy. No bone lesions. IMPRESSION: Cardiomegaly with pulmonary vascular congestion. Mild interstitial edema. There may be a degree of underlying congestive heart failure. No consolidation. Areas of mild atelectatic change bilaterally. Electronically Signed   By: Bretta Bang III M.D.   On: 12/12/2017 13:11   Ct Angio Chest Pe W Or Wo Contrast  Result Date: 12/15/2017 CLINICAL DATA:  37 year old male with shortness of breath. EXAM: CT ANGIOGRAPHY CHEST WITH CONTRAST TECHNIQUE: Multidetector CT imaging of the chest was performed using the standard protocol during bolus administration of intravenous contrast. Multiplanar CT image reconstructions and MIPs were obtained to evaluate the vascular anatomy. CONTRAST:  ISOVUE-370 IOPAMIDOL (ISOVUE-370) INJECTION 76% COMPARISON:  Chest radiograph dated 12/14/2017 FINDINGS: Cardiovascular: There is mild cardiomegaly. Retrograde flow of contrast from the right atrium into the IVC consistent with right heart dysfunction. No pericardial effusion. The thoracic aorta is unremarkable as visualized. There is no CT evidence of pulmonary embolism. Mediastinum/Nodes: Mildly enlarged right hilar lymph nodes measure up to 14 mm in  short axis. The esophagus and the  thyroid gland are grossly unremarkable. No mediastinal fluid collection. Lungs/Pleura: There is interstitial prominence and interlobular septal thickening predominantly involving the lower lobes most consistent with edema. Minimal bibasilar linear atelectatic changes noted. No lobar consolidation. There is no pleural effusion or pneumothorax. The central airways are patent. Upper Abdomen: Enlarged appearance of the liver. Partially visualized 2 cm rounded lesion in the left adrenal gland, indeterminate but corresponds to the lesion seen on the prior CT of 07/09/2013. Musculoskeletal: No chest wall abnormality. No acute or significant osseous findings. Review of the MIP images confirms the above findings. IMPRESSION: 1. No CT evidence of pulmonary embolism. 2. Cardiomegaly with findings of CHF and interstitial edema. 3. Mildly enlarged right hilar lymph nodes of indeterminate etiology, likely reactive. Clinical correlation and attention on follow-up imaging recommended. Electronically Signed   By: Elgie Collard M.D.   On: 12/15/2017 02:39   Dg Chest Portable 1 View  Result Date: 12/14/2017 CLINICAL DATA:  Initial evaluation for acute shortness of breath and chest pain. EXAM: PORTABLE CHEST 1 VIEW COMPARISON:  Prior radiograph from 12/12/2017. FINDINGS: Moderate cardiomegaly, stable. Mediastinal silhouette within normal limits. Lungs mildly hypoinflated. Diffuse vascular congestion with interstitial prominence, suggesting mild diffuse pulmonary interstitial edema. Superimposed linear scarring and/or atelectasis at the peripheral mid left lung. No consolidative opacity. No appreciable pleural effusion. No pneumothorax. No acute osseous abnormality. IMPRESSION: Cardiomegaly with mild diffuse pulmonary interstitial edema, suggesting CHF. Electronically Signed   By: Rise Mu M.D.   On: 12/14/2017 22:45    Assessment and Plan:   1. WPW/narrow complex tachycardia Heart rate up to 190 bpm on arrival,  very symptomatic Case was discussed with Dr. Graciela Husbands We will start him on metoprolol tartrate 50 twice daily, flecainide 100 twice daily Echocardiogram pending Likely diastolic CHF in the setting of tachycardia -Long discussion with the patient concerning above Recommended we make an appointment with EP in [facility] for consideration of ablation, especially considering his young age.  2) shortness of breath/pulmonary edema Exacerbated by tachycardia as above Recommend Lasix 40 daily He is drinking lots of water daily, recommended fluid restrictions Management of his sleep apnea and diet  3) morbid obesity We have encouraged continued exercise, careful diet management in an effort to lose weight.  4) hypertension We will start metoprolol tartrate as above    ADDENDUM: Echocardiogram prelim performed while patient was in the emergency room shows severely dilated LV with severely reduced ejection fraction 20% -25% Global hypokinesis --- Given his shortness of breath would recommend he be admitted to inpatient for further work-up of his cardiomyopathy --- Unable to exclude tachycardia mediated cardiomyopathy    Case discussed with emergency room physician and with hospitalist service and with EP/Dr. Graciela Husbands  Total encounter time more than 110 minutes  Greater than 50% was spent in counseling and coordination of care with the patient   For questions or updates, please contact [facility] Please consult [email or web address] for contact info under     Signed, Julien Nordmann, MD  12/15/2017 1:51 PM

## 2017-12-15 NOTE — H&P (Signed)
[facility] [place] at [facility]   PATIENT NAME: Alvin Phillips    MR#:  [id number]  DATE OF BIRTH:  Apr 10, 1980  DATE OF ADMISSION:  12/14/2017  PRIMARY CARE PHYSICIAN: Eustaquio Boyden, MD   REQUESTING/REFERRING PHYSICIAN: Dionne Bucy, MD  CHIEF COMPLAINT:   Chief Complaint  Patient presents with  . Shortness of Breath  . Chest Pain    HISTORY OF PRESENT ILLNESS:  Alvin Phillips  is a 37 y.o. male with a known history of WPW who p/w SOB, tachycardia/palpitations, chest pain/pressure. Pt states he felt well throughout the day. In fact, he says that last night he managed to get the best nights sleep he has had in a long time. When he woke up in the morning, he felt his day was off to a great start. He went to work, had an uneventful day,  and left work @~1700PM. He went to his nephew's football game in [place]. He states after the game he walked up a steep hill and became very fatigued/tired. He endorses continued fatigue/malaise throughout the evening, progressively worsening. He states he was hungry after the game, and ate. He developed diaphoresis and palpitations shortly thereafter. He had to do some shopping, and went to [place]. He states he had difficulty moving things in and out of the cart due to fatigue/malaise. He sat down at the blood pressure check station, and found BP to be 140s/100s, HR 190s. He rechecked HR after some time, decreased to 150s. Pt went home, and started taking items in, but continued to have difficulty due to fatigue. He eventually became so severely exhausted that he felt unable to engage in any physical activity. He endorses midchest pressure w/o frank pain. He drove himself to the ED. HR in ED was in the 190s on arrival, narrow-complex. Pt w/ Hx WPW, saw Dr. Okey Dupre on 09/25. He needs an EP study.  PAST MEDICAL HISTORY:   Past Medical History:  Diagnosis Date  . Diabetes mellitus without complication (HCC) 07/10/13   referred to DSME,  did not show (04/2014)  . GERD (gastroesophageal reflux disease)   . Hypertension   . Morbidly obese (HCC) 03/24/2014  . OSA (obstructive sleep apnea) 10/29/2013  . Wolff-Parkinson-White (WPW) syndrome     PAST SURGICAL HISTORY:   Past Surgical History:  Procedure Laterality Date  . FRACTURE SURGERY Left 2002   fractured arm  . HERNIA REPAIR  12-16-13   umbilical    SOCIAL HISTORY:   Social History   Tobacco Use  . Smoking status: Former Smoker    Packs/day: 0.25    Years: 15.00    Pack years: 3.75    Types: Cigarettes    Last attempt to quit: 2019    Years since quitting: 0.7  . Smokeless tobacco: Never Used  Substance Use Topics  . Alcohol use: Yes    Alcohol/week: 0.0 standard drinks    Comment: Occassional beer, shots of liquor    FAMILY HISTORY:   Family History  Problem Relation Age of Onset  . Diabetes Maternal Grandmother   . Heart attack Maternal Grandmother 80  . Diabetes Paternal Grandfather     DRUG ALLERGIES:  No Known Allergies  REVIEW OF SYSTEMS:   Review of Systems  Constitutional: Positive for diaphoresis and malaise/fatigue. Negative for chills, fever and weight loss.  HENT: Negative for congestion, ear pain, hearing loss, nosebleeds, sinus pain, sore throat and tinnitus.   Eyes: Negative for blurred vision, double vision and photophobia.  Respiratory: Positive for shortness of breath. Negative for cough, hemoptysis and sputum production.   Cardiovascular: Positive for chest pain and palpitations. Negative for orthopnea, claudication, leg swelling and PND.  Gastrointestinal: Negative for abdominal pain, blood in stool, constipation, diarrhea, heartburn, melena, nausea and vomiting.  Genitourinary: Negative for dysuria, frequency, hematuria and urgency.  Musculoskeletal: Negative for back pain, falls, joint pain, myalgias and neck pain.  Skin: Negative for itching and rash.  Neurological: Positive for dizziness and weakness. Negative for  tingling, tremors, sensory change, speech change, focal weakness, seizures, loss of consciousness and headaches.  Psychiatric/Behavioral: Negative for memory loss. The patient does not have insomnia.    MEDICATIONS AT HOME:   Prior to Admission medications   Medication Sig Start Date End Date Taking? Authorizing Provider  aspirin EC 81 MG tablet Take 1 tablet (81 mg total) by mouth daily. 12/12/17  Yes Eustaquio Boyden, MD  furosemide (LASIX) 20 MG tablet Take 1 tablet (20 mg total) by mouth daily as needed for fluid or edema. 12/12/17  Yes Eustaquio Boyden, MD  glucose blood (BAYER CONTOUR NEXT TEST) test strip Use as instructed 10/24/16  Yes Eustaquio Boyden, MD  ibuprofen (ADVIL,MOTRIN) 200 MG tablet Take 200 mg by mouth as needed.   Yes [provider]  Lancets Letta Pate ULTRASOFT) lancets Use as instructed 10/24/16  Yes Eustaquio Boyden, MD  lisinopril (PRINIVIL,ZESTRIL) 10 MG tablet Take 1 tablet (10 mg total) by mouth daily. 12/12/17  Yes Eustaquio Boyden, MD  metFORMIN (GLUCOPHAGE XR) 750 MG 24 hr tablet Take 1 tablet (750 mg total) by mouth daily with breakfast. 12/12/17  Yes Eustaquio Boyden, MD  pantoprazole (PROTONIX) 20 MG tablet Take 1 tablet (20 mg total) by mouth daily. Patient taking differently: Take 20 mg by mouth daily. Takes as needed 10/26/17 10/26/18 Yes Bedsole, Amy E, MD      VITAL SIGNS:  Blood pressure 130/83, pulse 96, temperature 97.7 F (36.5 C), temperature source Oral, resp. rate (!) 22, height 5\' 10"  (1.778 m), weight (!) 168.7 kg, SpO2 98 %.  PHYSICAL EXAMINATION:  Physical Exam  Constitutional: He is oriented to person, place, and time. He appears well-developed and well-nourished. He is active and cooperative.  Non-toxic appearance. He does not have a sickly appearance. He does not appear ill. No distress. He is not intubated.  HENT:  Head: Normocephalic and atraumatic.  Mouth/Throat: Oropharynx is clear and moist. No oropharyngeal exudate.  Eyes:  Conjunctivae, EOM and lids are normal. No scleral icterus.  Neck: Neck supple. No JVD present. No thyromegaly present.  Cardiovascular: Normal rate, regular rhythm, S1 normal, S2 normal and normal heart sounds.  No extrasystoles are present. Exam reveals no gallop, no S3, no S4, no distant heart sounds and no friction rub.  No murmur heard. Pulmonary/Chest: Effort normal. No accessory muscle usage or stridor. No apnea, no tachypnea and no bradypnea. He is not intubated. No respiratory distress. He has decreased breath sounds in the right upper field, the right middle field, the right lower field, the left upper field, the left middle field and the left lower field. He has no wheezes. He has no rhonchi. He has no rales.  Abdominal: Soft. Bowel sounds are normal. He exhibits no distension. There is no tenderness. There is no rigidity, no rebound and no guarding.  Musculoskeletal: Normal range of motion. He exhibits no edema.       Right lower leg: Normal. He exhibits no tenderness and no edema.       Left  lower leg: Normal. He exhibits no tenderness and no edema.  Lymphadenopathy:    He has no cervical adenopathy.  Neurological: He is alert and oriented to person, place, and time. He is not disoriented.  Skin: Skin is warm, dry and intact. No rash noted. He is not diaphoretic. No erythema. No pallor.  Psychiatric: He has a normal mood and affect. His speech is normal and behavior is normal. Judgment and thought content normal. His mood appears not anxious. He is not agitated. Cognition and memory are normal.   Back in sinus (HR 90s) at the time of my assessment. Lungs clear. LABORATORY PANEL:   CBC Recent Labs  Lab 12/14/17 2228  WBC 8.1  HGB 14.2  HCT 42.4  PLT 349   ------------------------------------------------------------------------------------------------------------------  Chemistries  Recent Labs  Lab 12/14/17 2228  NA 138  K 3.8  CL 103  CO2 26  GLUCOSE 312*  BUN 21*    CREATININE 1.01  CALCIUM 8.2*  AST 46*  ALT 57*  ALKPHOS 59  BILITOT 0.8   ------------------------------------------------------------------------------------------------------------------  Cardiac Enzymes Recent Labs  Lab 12/15/17 0254  TROPONINI 0.03*   ------------------------------------------------------------------------------------------------------------------  RADIOLOGY:  Ct Angio Chest Pe W Or Wo Contrast  Result Date: 12/15/2017 CLINICAL DATA:  37 year old male with shortness of breath. EXAM: CT ANGIOGRAPHY CHEST WITH CONTRAST TECHNIQUE: Multidetector CT imaging of the chest was performed using the standard protocol during bolus administration of intravenous contrast. Multiplanar CT image reconstructions and MIPs were obtained to evaluate the vascular anatomy. CONTRAST:  ISOVUE-370 IOPAMIDOL (ISOVUE-370) INJECTION 76% COMPARISON:  Chest radiograph dated 12/14/2017 FINDINGS: Cardiovascular: There is mild cardiomegaly. Retrograde flow of contrast from the right atrium into the IVC consistent with right heart dysfunction. No pericardial effusion. The thoracic aorta is unremarkable as visualized. There is no CT evidence of pulmonary embolism. Mediastinum/Nodes: Mildly enlarged right hilar lymph nodes measure up to 14 mm in short axis. The esophagus and the thyroid gland are grossly unremarkable. No mediastinal fluid collection. Lungs/Pleura: There is interstitial prominence and interlobular septal thickening predominantly involving the lower lobes most consistent with edema. Minimal bibasilar linear atelectatic changes noted. No lobar consolidation. There is no pleural effusion or pneumothorax. The central airways are patent. Upper Abdomen: Enlarged appearance of the liver. Partially visualized 2 cm rounded lesion in the left adrenal gland, indeterminate but corresponds to the lesion seen on the prior CT of 07/09/2013. Musculoskeletal: No chest wall abnormality. No acute or  significant osseous findings. Review of the MIP images confirms the above findings. IMPRESSION: 1. No CT evidence of pulmonary embolism. 2. Cardiomegaly with findings of CHF and interstitial edema. 3. Mildly enlarged right hilar lymph nodes of indeterminate etiology, likely reactive. Clinical correlation and attention on follow-up imaging recommended. Electronically Signed   By: Elgie Collard M.D.   On: 12/15/2017 02:39   Dg Chest Portable 1 View  Result Date: 12/14/2017 CLINICAL DATA:  Initial evaluation for acute shortness of breath and chest pain. EXAM: PORTABLE CHEST 1 VIEW COMPARISON:  Prior radiograph from 12/12/2017. FINDINGS: Moderate cardiomegaly, stable. Mediastinal silhouette within normal limits. Lungs mildly hypoinflated. Diffuse vascular congestion with interstitial prominence, suggesting mild diffuse pulmonary interstitial edema. Superimposed linear scarring and/or atelectasis at the peripheral mid left lung. No consolidative opacity. No appreciable pleural effusion. No pneumothorax. No acute osseous abnormality. IMPRESSION: Cardiomegaly with mild diffuse pulmonary interstitial edema, suggesting CHF. Electronically Signed   By: Rise Mu M.D.   On: 12/14/2017 22:45   IMPRESSION AND PLAN:   A/P: 67M w/  WPW p/w SOB, tachycardia/palpitations, chest pain/pressure, HR 190s, narrow-complex tachycardia. HR in 90s at the time of my assessment. Hyperglycemia (w/ T2NIDDM), hypocalcemia, transaminasemia, BNP elevation, Troponin leak, pulmonary edema. -SOB, palpitations, chest pain/pressure, narrow-complex tachycardia, WPW: HR 190s on ED arrival. Now back in sinus, HR 90s. Tele, cardiac monitoring. Trend Trop-I. Serial EKG. ZOX09. Echo. I was told that Cardiology recommended beta blockade, but I am not comfortable giving AV nodal blocking agents to a pt w/ WPW. Inpatient Cardiology consult. -Hyperglycemia/T2NIDDM: SSI, hold PO antihyperglycemics. -Hypocalcemia: Ionized calcium. -BNP  elevation, Trop-I leak, pulmonary edema: CXR (+) pulmonary edema. BNP 234, Trop-I 0.03. Likely 2/2 strain, failure in setting of impaired ventricular filling. Expected to improve w/ rate decrease. Denies active SOB and has clear lungs at the time of my exam. Echo pending. Mgmt as above. -c/w home meds. -FEN/GI: Cardiac diabetic diet. -DVT PPx: Lovenox. -Code status: Full code. -Disposition: Admission, > 2 midnights.  All the records are reviewed and case discussed with ED provider. Management plans discussed with the patient, family and they are in agreement.  CODE STATUS: Full code.  TOTAL TIME TAKING CARE OF THIS PATIENT: 90 minutes.    Barbaraann Rondo M.D on 12/15/2017 at 6:32 AM  Between 7am to 6pm - Pager - [phone]  After 6pm go to [email or web address] - Social research officer, government  [facility] [place] [facility]  Office  [phone]  CC: Primary care physician; Eustaquio Boyden, MD   Note: This dictation was prepared with Dragon dictation along with smaller phrase technology. Any transcriptional errors that result from this process are unintentional.

## 2017-12-15 NOTE — Progress Notes (Signed)
Sebastian Ache, RN, AD spoke with Dorene Sorrow in Echo requesting echo to be done

## 2017-12-16 DIAGNOSIS — I10 Essential (primary) hypertension: Secondary | ICD-10-CM

## 2017-12-16 DIAGNOSIS — I5023 Acute on chronic systolic (congestive) heart failure: Secondary | ICD-10-CM

## 2017-12-16 DIAGNOSIS — I471 Supraventricular tachycardia: Secondary | ICD-10-CM

## 2017-12-16 DIAGNOSIS — E1165 Type 2 diabetes mellitus with hyperglycemia: Secondary | ICD-10-CM

## 2017-12-16 LAB — GLUCOSE, CAPILLARY
GLUCOSE-CAPILLARY: 224 mg/dL — AB (ref 70–99)
GLUCOSE-CAPILLARY: 270 mg/dL — AB (ref 70–99)
Glucose-Capillary: 181 mg/dL — ABNORMAL HIGH (ref 70–99)
Glucose-Capillary: 267 mg/dL — ABNORMAL HIGH (ref 70–99)

## 2017-12-16 LAB — BASIC METABOLIC PANEL
ANION GAP: 12 (ref 5–15)
BUN: 15 mg/dL (ref 6–20)
CO2: 25 mmol/L (ref 22–32)
Calcium: 8.5 mg/dL — ABNORMAL LOW (ref 8.9–10.3)
Chloride: 104 mmol/L (ref 98–111)
Creatinine, Ser: 0.82 mg/dL (ref 0.61–1.24)
GFR calc Af Amer: >60 mL/min (ref >60)
GFR calc non Af Amer: >60 mL/min (ref >60)
GLUCOSE: 221 mg/dL — AB (ref 70–99)
POTASSIUM: 4.6 mmol/L (ref 3.5–5.1)
Sodium: 141 mmol/L (ref 135–145)

## 2017-12-16 LAB — CBC
HCT: 41.9 % (ref 40.0–52.0)
Hemoglobin: 13.5 g/dL (ref 13.0–18.0)
MCH: 29.5 pg (ref 26.0–34.0)
MCHC: 32.2 g/dL (ref 32.0–36.0)
MCV: 91.6 fL (ref 80.0–100.0)
Platelets: 248 10*3/uL (ref 150–440)
RBC: 4.57 MIL/uL (ref 4.40–5.90)
RDW: 13.9 % (ref 11.5–14.5)
WBC: 7.4 10*3/uL (ref 3.8–10.6)

## 2017-12-16 LAB — HEMOGLOBIN A1C
Hgb A1c MFr Bld: 12.4 % — ABNORMAL HIGH (ref 4.8–5.6)
Mean Plasma Glucose: 309.18 mg/dL

## 2017-12-16 LAB — CALCIUM, IONIZED: CALCIUM, IONIZED, SERUM: 4.6 mg/dL (ref 4.5–5.6)

## 2017-12-16 LAB — HIV ANTIBODY (ROUTINE TESTING W REFLEX): HIV SCREEN 4TH GENERATION: NONREACTIVE

## 2017-12-16 MED ORDER — LOSARTAN POTASSIUM 50 MG PO TABS
50.0000 mg | ORAL_TABLET | Freq: Every day | ORAL | Status: DC
  Administered 2017-12-16 – 2017-12-18 (×3): 50 mg via ORAL
  Filled 2017-12-16 (×3): qty 1

## 2017-12-16 NOTE — Progress Notes (Signed)
[facility] - [place] at [facility]   PATIENT NAME: Alvin Phillips    MR#:  [id number]  DATE OF BIRTH:  28-Jun-1980  SUBJECTIVE:  CHIEF COMPLAINT: Patient is short of breath but denies any chest pain  REVIEW OF SYSTEMS:  CONSTITUTIONAL: No fever, fatigue or weakness.  EYES: No blurred or double vision.  EARS, NOSE, AND THROAT: No tinnitus or ear pain.  RESPIRATORY: No cough, reporting worsening of shortness of breath lately, denies wheezing or hemoptysis.  CARDIOVASCULAR: No chest pain, orthopnea, edema.  GASTROINTESTINAL: No nausea, vomiting, diarrhea or abdominal pain.  GENITOURINARY: No dysuria, hematuria.  ENDOCRINE: No polyuria, nocturia,  HEMATOLOGY: No anemia, easy bruising or bleeding SKIN: No rash or lesion. MUSCULOSKELETAL: No joint pain or arthritis.   NEUROLOGIC: No tingling, numbness, weakness.  PSYCHIATRY: No anxiety or depression.   DRUG ALLERGIES:  No Known Allergies  VITALS:  Blood pressure 128/90, pulse 84, temperature 97.6 F (36.4 C), temperature source Oral, resp. rate 18, height 5\' 10"  (1.778 m), weight (!) 166.7 kg, SpO2 95 %.  PHYSICAL EXAMINATION:  GENERAL:  37 y.o.-year-old patient lying in the bed with no acute distress.  Morbidly obese EYES: Pupils equal, round, reactive to light and accommodation. No scleral icterus. Extraocular muscles intact.  HEENT: Head atraumatic, normocephalic. Oropharynx and nasopharynx clear.  NECK:  Supple, no jugular venous distention. No thyroid enlargement, no tenderness.  LUNGS: Diminished breath sounds bilaterally, no wheezing, patient has rales,  crepitation. No use of accessory muscles of respiration.  CARDIOVASCULAR: S1, S2 normal. No murmurs, rubs, or gallops.  ABDOMEN: Soft, nontender, nondistended. Bowel sounds present. No organomegaly or mass.  EXTREMITIES: some pedal edema, cyanosis, or clubbing.  NEUROLOGIC: Cranial nerves II through XII are intact. Muscle strength 5/5 in all extremities.  Sensation intact. Gait not checked.  PSYCHIATRIC: The patient is alert and oriented x 3.  SKIN: No obvious rash, lesion, or ulcer.    LABORATORY PANEL:   CBC Recent Labs  Lab 12/16/17 0542  WBC 7.4  HGB 13.5  HCT 41.9  PLT 248   ------------------------------------------------------------------------------------------------------------------  Chemistries  Recent Labs  Lab 12/14/17 2228 12/15/17 0500 12/16/17 0542  NA 138  --  141  K 3.8  --  4.6  CL 103  --  104  CO2 26  --  25  GLUCOSE 312*  --  221*  BUN 21*  --  15  CREATININE 1.01  --  0.82  CALCIUM 8.2*  --  8.5*  MG  --  2.0  --   AST 46*  --   --   ALT 57*  --   --   ALKPHOS 59  --   --   BILITOT 0.8  --   --    ------------------------------------------------------------------------------------------------------------------  Cardiac Enzymes Recent Labs  Lab 12/15/17 0500  TROPONINI 0.03*   ------------------------------------------------------------------------------------------------------------------  RADIOLOGY:  Ct Angio Chest Pe W Or Wo Contrast  Result Date: 12/15/2017 CLINICAL DATA:  37 year old male with shortness of breath. EXAM: CT ANGIOGRAPHY CHEST WITH CONTRAST TECHNIQUE: Multidetector CT imaging of the chest was performed using the standard protocol during bolus administration of intravenous contrast. Multiplanar CT image reconstructions and MIPs were obtained to evaluate the vascular anatomy. CONTRAST:  ISOVUE-370 IOPAMIDOL (ISOVUE-370) INJECTION 76% COMPARISON:  Chest radiograph dated 12/14/2017 FINDINGS: Cardiovascular: There is mild cardiomegaly. Retrograde flow of contrast from the right atrium into the IVC consistent with right heart dysfunction. No pericardial effusion. The thoracic aorta is unremarkable as visualized. There is no  CT evidence of pulmonary embolism. Mediastinum/Nodes: Mildly enlarged right hilar lymph nodes measure up to 14 mm in short axis. The esophagus and the  thyroid gland are grossly unremarkable. No mediastinal fluid collection. Lungs/Pleura: There is interstitial prominence and interlobular septal thickening predominantly involving the lower lobes most consistent with edema. Minimal bibasilar linear atelectatic changes noted. No lobar consolidation. There is no pleural effusion or pneumothorax. The central airways are patent. Upper Abdomen: Enlarged appearance of the liver. Partially visualized 2 cm rounded lesion in the left adrenal gland, indeterminate but corresponds to the lesion seen on the prior CT of 07/09/2013. Musculoskeletal: No chest wall abnormality. No acute or significant osseous findings. Review of the MIP images confirms the above findings. IMPRESSION: 1. No CT evidence of pulmonary embolism. 2. Cardiomegaly with findings of CHF and interstitial edema. 3. Mildly enlarged right hilar lymph nodes of indeterminate etiology, likely reactive. Clinical correlation and attention on follow-up imaging recommended. Electronically Signed   By: Elgie Collard M.D.   On: 12/15/2017 02:39   Dg Chest Portable 1 View  Result Date: 12/14/2017 CLINICAL DATA:  Initial evaluation for acute shortness of breath and chest pain. EXAM: PORTABLE CHEST 1 VIEW COMPARISON:  Prior radiograph from 12/12/2017. FINDINGS: Moderate cardiomegaly, stable. Mediastinal silhouette within normal limits. Lungs mildly hypoinflated. Diffuse vascular congestion with interstitial prominence, suggesting mild diffuse pulmonary interstitial edema. Superimposed linear scarring and/or atelectasis at the peripheral mid left lung. No consolidative opacity. No appreciable pleural effusion. No pneumothorax. No acute osseous abnormality. IMPRESSION: Cardiomegaly with mild diffuse pulmonary interstitial edema, suggesting CHF. Electronically Signed   By: Rise Mu M.D.   On: 12/14/2017 22:45    EKG:   Orders placed or performed during the hospital encounter of 12/14/17  . EKG 12-Lead   . EKG 12-Lead  . ED EKG  . ED EKG  . EKG 12-lead  . EKG 12-Lead  . EKG 12-Lead    ASSESSMENT AND PLAN:     1.  Narrow complex tachycardia with a history of  WPW Patient was seen by cardiology and case discussed with Dr. Graciela Husbands Recommending to start the patient on metoprolol 50 mg twice a day  Flecainide never started. Echocardiogram is done results are pending-preliminary report 20% ejection fraction- cath on Monday.  may need enteresto and spironolactone on discharge. Follow with [facility] Dr. Graciela Husbands for possible ablation after discharge  2.  Interstitial pulmonary edema Lasix 40 mg IV given and recommending 40 mg of Lasix once daily as renal function is slightly worse Reassess the patient tomorrow and if necessary consider IV Lasix Daily weight monitoring intake and output and strict DASH diet  Seems to be noncompliant with the diet dietary consult placed  3.   diabetes mellitus Patient is refusing insulin as reported by the nurse reinforced the importance of compliance Checked A1c- 12.4  4.  Obstructive sleep apnea CPAP nightly Sleep study is pending which is scheduled as an outpatient  5.  Morbid obesity Encouraged diet and exercise   6.  Chest pain acute MI ruled out with negative troponins   All the records are reviewed and case discussed with Care Management/Social Workerr. Management plans discussed with the patient, family and they are in agreement.  CODE STATUS: fc  TOTAL TIME TAKING CARE OF THIS PATIENT: 39 minutes.   POSSIBLE D/C IN 2 DAYS, DEPENDING ON CLINICAL CONDITION.  Note: This dictation was prepared with Dragon dictation along with smaller phrase technology. Any transcriptional errors that result from this process are unintentional.  Altamese Dilling M.D on 12/16/2017 at 2:52 PM  Between 7am to 6pm - Pager - [phone] After 6pm go to [email or web address] - password [email or web address]  [facility]  Office   [phone]  CC: Primary care physician; Eustaquio Boyden, MD

## 2017-12-16 NOTE — Progress Notes (Signed)
Progress Note  Patient Name: Alvin Phillips Date of Encounter: 12/16/2017  Primary Cardiologist: Yvonne Kendall, MD  Subjective   Patient feels well this morning.  He experienced some orthopnea overnight and transient right-sided chest pains that he describes as flutters.  He had to sleep in the chair for part of the night.  Lower extremity edema improving but not resolved.  Inpatient Medications    Scheduled Meds: . aspirin  81 mg Oral Daily  . enoxaparin (LOVENOX) injection  40 mg Subcutaneous Q12H  . furosemide  40 mg Intravenous Once  . furosemide  40 mg Intravenous BID  . insulin aspart  0-20 Units Subcutaneous TID WC  . insulin aspart  0-5 Units Subcutaneous QHS  . losartan  50 mg Oral Daily  . metoprolol succinate  50 mg Oral Daily   Continuous Infusions:  PRN Meds: acetaminophen, bisacodyl, morphine injection, nitroGLYCERIN, ondansetron (ZOFRAN) IV, senna-docusate   Vital Signs    Vitals:   12/15/17 2008 12/16/17 0427 12/16/17 0500 12/16/17 0807  BP: 117/80 (!) 120/92  128/90  Pulse: 93 82  84  Resp: 20 20  18   Temp: 98.6 F (37 C) 97.6 F (36.4 C)    TempSrc: Oral Oral    SpO2: 99% 96%  95%  Weight:   (!) 166.7 kg   Height:        Intake/Output Summary (Last 24 hours) at 12/16/2017 0837 Last data filed at 12/15/2017 1828 Gross per 24 hour  Intake -  Output 625 ml  Net -625 ml   Filed Weights   12/14/17 2216 12/15/17 1610 12/16/17 0500  Weight: (!) 168.7 kg (!) 167.7 kg (!) 166.7 kg    Telemetry    Normal sinus rhythm- Personally Reviewed  ECG   No new tracing available  Physical Exam   GEN:  Overly obese.  No acute distress.   Neck: No JVD, though body habitus limits evaluation. Cardiac: RRR, no murmurs, rubs, or gallops.  Respiratory: Clear to auscultation bilaterally. GI: Soft, nontender, non-distended  MS:  Trace pretibial edema bilaterally; No deformity. Neuro:  Nonfocal  Psych: Normal affect   Labs    Chemistry Recent Labs    Lab 12/12/17 1210 12/14/17 2228 12/16/17 0542  NA 135 138 141  K 4.2 3.8 4.6  CL 99 103 104  CO2 30 26 25   GLUCOSE 306* 312* 221*  BUN 15 21* 15  CREATININE 0.93 1.01 0.82  CALCIUM 9.1 8.2* 8.5*  PROT 7.1 6.7  --   ALBUMIN 3.8 3.5  --   AST 29 46*  --   ALT 52 57*  --   ALKPHOS 59 59  --   BILITOT 0.8 0.8  --   GFRNONAA  --  >60 >60  GFRAA  --  >60 >60  ANIONGAP  --  9 12     Hematology Recent Labs  Lab 12/12/17 1210 12/14/17 2228 12/16/17 0542  WBC 7.1 8.1 7.4  RBC 4.93 4.75 4.57  HGB 14.3 14.2 13.5  HCT 44.4 42.4 41.9  MCV 90.1 89.3 91.6  MCH  --  30.0 29.5  MCHC 32.1 33.6 32.2  RDW 13.7 13.8 13.9  PLT 385.0 349 248    Cardiac Enzymes Recent Labs  Lab 12/14/17 2228 12/15/17 0254 12/15/17 0500  TROPONINI 0.03* 0.03* 0.03*   No results for input(s): TROPIPOC in the last 168 hours.   BNP Recent Labs  Lab 12/12/17 1210 12/14/17 2228  BNP  --  234.0*  PROBNP 206.0*  --  DDimer No results for input(s): DDIMER in the last 168 hours.   Radiology    Ct Angio Chest Pe W Or Wo Contrast  Result Date: 12/15/2017 CLINICAL DATA:  37 year old male with shortness of breath. EXAM: CT ANGIOGRAPHY CHEST WITH CONTRAST TECHNIQUE: Multidetector CT imaging of the chest was performed using the standard protocol during bolus administration of intravenous contrast. Multiplanar CT image reconstructions and MIPs were obtained to evaluate the vascular anatomy. CONTRAST:  ISOVUE-370 IOPAMIDOL (ISOVUE-370) INJECTION 76% COMPARISON:  Chest radiograph dated 12/14/2017 FINDINGS: Cardiovascular: There is mild cardiomegaly. Retrograde flow of contrast from the right atrium into the IVC consistent with right heart dysfunction. No pericardial effusion. The thoracic aorta is unremarkable as visualized. There is no CT evidence of pulmonary embolism. Mediastinum/Nodes: Mildly enlarged right hilar lymph nodes measure up to 14 mm in short axis. The esophagus and the thyroid gland  are grossly unremarkable. No mediastinal fluid collection. Lungs/Pleura: There is interstitial prominence and interlobular septal thickening predominantly involving the lower lobes most consistent with edema. Minimal bibasilar linear atelectatic changes noted. No lobar consolidation. There is no pleural effusion or pneumothorax. The central airways are patent. Upper Abdomen: Enlarged appearance of the liver. Partially visualized 2 cm rounded lesion in the left adrenal gland, indeterminate but corresponds to the lesion seen on the prior CT of 07/09/2013. Musculoskeletal: No chest wall abnormality. No acute or significant osseous findings. Review of the MIP images confirms the above findings. IMPRESSION: 1. No CT evidence of pulmonary embolism. 2. Cardiomegaly with findings of CHF and interstitial edema. 3. Mildly enlarged right hilar lymph nodes of indeterminate etiology, likely reactive. Clinical correlation and attention on follow-up imaging recommended. Electronically Signed   By: Elgie Collard M.D.   On: 12/15/2017 02:39   Dg Chest Portable 1 View  Result Date: 12/14/2017 CLINICAL DATA:  Initial evaluation for acute shortness of breath and chest pain. EXAM: PORTABLE CHEST 1 VIEW COMPARISON:  Prior radiograph from 12/12/2017. FINDINGS: Moderate cardiomegaly, stable. Mediastinal silhouette within normal limits. Lungs mildly hypoinflated. Diffuse vascular congestion with interstitial prominence, suggesting mild diffuse pulmonary interstitial edema. Superimposed linear scarring and/or atelectasis at the peripheral mid left lung. No consolidative opacity. No appreciable pleural effusion. No pneumothorax. No acute osseous abnormality. IMPRESSION: Cardiomegaly with mild diffuse pulmonary interstitial edema, suggesting CHF. Electronically Signed   By: Rise Mu M.D.   On: 12/14/2017 22:45    Cardiac Studies   Echo (12/15/17): - Left ventricle: The cavity size was severely dilated. There was   mild  concentric hypertrophy. Systolic function was severely   reduced. The estimated ejection fraction was in the range of 20%   to 25%. Diffuse hypokinesis. Regional wall motion abnormalities   cannot be excluded. - Mitral valve: There was mild regurgitation. - Left atrium: The atrium was mildly dilated. - Right ventricle: Poorly visualized. Systolic function was mildly   reduced. - Pulmonary arteries: Systolic pressure was mildly elevated. PA   peak pressure: 36 mm Hg (S).  Patient Profile     37 y.o. male man with history of WPW, HTN, uncontrolled diabetes mellitus, morbid obesity, GERD, and obstructive sleep apnea, admitted with narrow complex tachycardia and acute on chronic systolic heart failure with LVEF of 20 to 25% by echo during this admission.  Assessment & Plan    Acute on chronic systolic heart failure Symptoms are slowly improving, though patient still has orthopnea and mild leg edema.  Continue furosemide 40 mg IV twice daily for net negative fluid balance of 2 L  over 24 hours.  Continue metoprolol succinate 50 mg daily.  Transition lisinopril to losartan 50 mg daily.  This will facilitate switching to [facility] in the future.  Consider adding spironolactone prior to discharge, as renal function and potassium allow.  Plan for right and left heart catheterization on Monday, if patient is able to lie flat.  I have reviewed the risks, indications, and alternatives to cardiac catheterization, possible angioplasty, and stenting with the patient. Risks include but are not limited to bleeding, infection, vascular injury, stroke, myocardial infection, arrhythmia, kidney injury, radiation-related injury in the case of prolonged fluoroscopy use, emergency cardiac surgery, and death. The patient understands the risks of serious complication is 1-2 in 1000 with diagnostic cardiac cath and 1-2% or less with angioplasty/stenting.  Narrow complex tachycardia and Wolff-Parkinson-White  syndrome Admitting EKGs reviewed, demonstrating narrow complex tachycardia.  Features are most suspicious for SVT (AVRT) and less likely atrial fibrillation.  Metoprolol and flecainide were recommended by Dr. Graciela Husbands per Dr. Windell Hummingbird note, though it does not appear the flecainide was given.  Continue metoprolol succinate 50 mg daily.  I will defer adding flecainide given severely reduced LVEF and uncertainty regarding coronary disease.  Outpatient follow-up with EP in [facility] to discuss accessory pathway ablation.  Chest pain Atypical, most likely due to heart failure and tachycardia.  Negligible troponin elevation is nonspecific and not consistent with ACS.  Continue medical therapy, as above.  Check fasting lipid panel with morning labs.  Plan for catheterization on Monday, as outlined above.  Hypertension Blood pressure upper normal to mildly elevated.  Continue current dose of metoprolol.  Transition lisinopril to losartan 50 mg daily.  Type 2 diabetes mellitus Uncontrolled with hemoglobin A1c of 12.4.  Continue management per internal medicine service.  Addition of empagliflozin at discharge may be beneficial, giving both diabetes mellitus and heart failure.    For questions or updates, please contact [facility] Please consult [email or web address] for contact info under [facility] cardiology.   Signed, Yvonne Kendall, MD  12/16/2017, 8:37 AM

## 2017-12-17 DIAGNOSIS — I472 Ventricular tachycardia: Secondary | ICD-10-CM

## 2017-12-17 LAB — BASIC METABOLIC PANEL
ANION GAP: 9 (ref 5–15)
BUN: 15 mg/dL (ref 6–20)
CO2: 28 mmol/L (ref 22–32)
Calcium: 8.7 mg/dL — ABNORMAL LOW (ref 8.9–10.3)
Chloride: 103 mmol/L (ref 98–111)
Creatinine, Ser: 0.92 mg/dL (ref 0.61–1.24)
GFR calc non Af Amer: >60 mL/min (ref >60)
Glucose, Bld: 236 mg/dL — ABNORMAL HIGH (ref 70–99)
Potassium: 3.9 mmol/L (ref 3.5–5.1)
Sodium: 140 mmol/L (ref 135–145)

## 2017-12-17 LAB — LIPID PANEL
Cholesterol: 163 mg/dL (ref 0–200)
HDL: 27 mg/dL — ABNORMAL LOW (ref >40)
LDL CALC: 93 mg/dL (ref 0–99)
Total CHOL/HDL Ratio: 6 RATIO
Triglycerides: 215 mg/dL — ABNORMAL HIGH (ref <150)
VLDL: 43 mg/dL — ABNORMAL HIGH (ref 0–40)

## 2017-12-17 LAB — PROTIME-INR
INR: 0.97
PROTHROMBIN TIME: 12.8 s (ref 11.4–15.2)

## 2017-12-17 LAB — GLUCOSE, CAPILLARY
Glucose-Capillary: 204 mg/dL — ABNORMAL HIGH (ref 70–99)
Glucose-Capillary: 264 mg/dL — ABNORMAL HIGH (ref 70–99)
Glucose-Capillary: 231 mg/dL — ABNORMAL HIGH (ref 70–99)

## 2017-12-17 MED ORDER — INSULIN DETEMIR 100 UNIT/ML ~~LOC~~ SOLN
10.0000 [IU] | Freq: Every day | SUBCUTANEOUS | Status: DC
  Administered 2017-12-18 – 2017-12-19 (×2): 10 [IU] via SUBCUTANEOUS
  Filled 2017-12-17 (×3): qty 0.1

## 2017-12-17 MED ORDER — FUROSEMIDE 10 MG/ML IJ SOLN: 40.0000 mg | Freq: Once | INTRAMUSCULAR | Status: DC

## 2017-12-17 MED ORDER — SODIUM CHLORIDE 0.9% FLUSH: 3.0000 mL | INTRAVENOUS | Status: DC | PRN

## 2017-12-17 MED ORDER — ASPIRIN 81 MG PO CHEW
81.0000 mg | CHEWABLE_TABLET | ORAL | Status: AC
  Administered 2017-12-18: 81 mg via ORAL
  Filled 2017-12-17: qty 1

## 2017-12-17 MED ORDER — FUROSEMIDE 10 MG/ML IJ SOLN: 80.0000 mg | Freq: Two times a day (BID) | INTRAMUSCULAR | Status: DC

## 2017-12-17 MED ORDER — SODIUM CHLORIDE 0.9 % IV SOLN: 250.0000 mL | INTRAVENOUS | Status: DC | PRN

## 2017-12-17 MED ORDER — SPIRONOLACTONE 25 MG PO TABS
25.0000 mg | ORAL_TABLET | Freq: Every day | ORAL | Status: DC
  Administered 2017-12-17 – 2017-12-21 (×5): 25 mg via ORAL
  Filled 2017-12-17 (×5): qty 1

## 2017-12-17 MED ORDER — SODIUM CHLORIDE 0.9 % IV SOLN
INTRAVENOUS | Status: DC
  Administered 2017-12-18: 12:00:00 via INTRAVENOUS

## 2017-12-17 MED ORDER — POTASSIUM CHLORIDE CRYS ER 10 MEQ PO TBCR
EXTENDED_RELEASE_TABLET | ORAL | Status: AC
  Administered 2017-12-17: 12:00:00
  Filled 2017-12-17: qty 1

## 2017-12-17 MED ORDER — SODIUM CHLORIDE 0.9% FLUSH
3.0000 mL | Freq: Two times a day (BID) | INTRAVENOUS | Status: DC
  Administered 2017-12-17 – 2017-12-18 (×2): 3 mL via INTRAVENOUS

## 2017-12-17 MED ORDER — FUROSEMIDE 10 MG/ML IJ SOLN
40.0000 mg | Freq: Two times a day (BID) | INTRAMUSCULAR | Status: AC
  Administered 2017-12-17: 40 mg via INTRAVENOUS
  Filled 2017-12-17: qty 4

## 2017-12-17 MED ORDER — POTASSIUM CHLORIDE CRYS ER 20 MEQ PO TBCR
20.0000 meq | EXTENDED_RELEASE_TABLET | Freq: Once | ORAL | Status: AC
  Administered 2017-12-17: 20 meq via ORAL

## 2017-12-17 NOTE — Plan of Care (Signed)
Nutrition Education Note  RD consulted for nutrition education regarding a DASH diet.   Lipid Panel     Component Value Date/Time   CHOL 163 12/17/2017 0535   TRIG 215 (H) 12/17/2017 0535   HDL 27 (L) 12/17/2017 0535   CHOLHDL 6.0 12/17/2017 0535   VLDL 43 (H) 12/17/2017 0535   LDLCALC 93 12/17/2017 630   37 year old male with PMHx of Dm, GERD, HTN, OSA, WPW who is admitted with narrow complex tachycardia and interstitial pulmonary edema.  Met with patient at bedside. He reports he has a very good appetite and intake. He has been weight stable though he is trying to lose weight. He typically eats 2-3 meals per day and occasionally snacks between meals. For breakfast he usually has an Herbal[employer] shake and tea, which does not keep him full enough. He also is not always able to eat a snack mid morning. He then over eats at lunch and dinner. He frequently eats out at restaurants, which do tend to be higher in sodium. He reports he has been enjoying the food here and is motivated to eat healthier meals and snacks so he does not get too hungry and overeat.  RD provided "Hypertension Nutrition Therapy" handout, also known as DASH diet from the [employer] of [employer]. Reviewed patient's dietary recall. Provided examples on ways to decrease sodium and fat intake in diet. Discouraged intake of processed foods and use of salt shaker. Encouraged fresh fruits and vegetables as well as whole grain sources of carbohydrates to maximize fiber intake. Teach back method used.  Expect good compliance. Patient is very motivated to make dietary and lifestyle changes.  Body mass index is 52.73 kg/m. Pt meets criteria for Obesity Class III based on current BMI.  Current diet order is Heart Healthy/Carbohydrate Modified, patient is consuming approximately 100% of meals at this time. Labs and medications reviewed. RD will provide Premier Protein po BID between meals as patient reports he is still feeling  very hungry after eating 100% of meals and snacks. No further nutrition interventions warranted at this time. RD contact information provided. If additional nutrition issues arise, please re-consult RD.  Willey Blade, MS, [place], LDN Office: [phone] Pager: [phone] After Hours/Weekend Pager: [phone]

## 2017-12-17 NOTE — Progress Notes (Signed)
[facility] - [place] at [facility]   PATIENT NAME: Alvin Phillips    MR#:  [id number]  DATE OF BIRTH:  April 15, 1980  SUBJECTIVE:  CHIEF COMPLAINT: Patient is short of breath but denies any chest pain.  Had good sleep last night.  REVIEW OF SYSTEMS:  CONSTITUTIONAL: No fever, fatigue or weakness.  EYES: No blurred or double vision.  EARS, NOSE, AND THROAT: No tinnitus or ear pain.  RESPIRATORY: No cough, reporting worsening of shortness of breath lately, denies wheezing or hemoptysis.  CARDIOVASCULAR: No chest pain, orthopnea, edema.  GASTROINTESTINAL: No nausea, vomiting, diarrhea or abdominal pain.  GENITOURINARY: No dysuria, hematuria.  ENDOCRINE: No polyuria, nocturia,  HEMATOLOGY: No anemia, easy bruising or bleeding SKIN: No rash or lesion. MUSCULOSKELETAL: No joint pain or arthritis.   NEUROLOGIC: No tingling, numbness, weakness.  PSYCHIATRY: No anxiety or depression.   DRUG ALLERGIES:  No Known Allergies  VITALS:  Blood pressure (!) 137/97, pulse 82, temperature 97.7 F (36.5 C), resp. rate 18, height 5\' 10"  (1.778 m), weight (!) 166.7 kg, SpO2 99 %.  PHYSICAL EXAMINATION:  GENERAL:  37 y.o.-year-old patient lying in the bed with no acute distress.  Morbidly obese EYES: Pupils equal, round, reactive to light and accommodation. No scleral icterus. Extraocular muscles intact.  HEENT: Head atraumatic, normocephalic. Oropharynx and nasopharynx clear.  NECK:  Supple, no jugular venous distention. No thyroid enlargement, no tenderness.  LUNGS: Diminished breath sounds bilaterally, no wheezing, patient has rales,  crepitation. No use of accessory muscles of respiration.  CARDIOVASCULAR: S1, S2 normal. No murmurs, rubs, or gallops.  ABDOMEN: Soft, nontender, nondistended. Bowel sounds present. No organomegaly or mass.  EXTREMITIES: some pedal edema, cyanosis, or clubbing.  NEUROLOGIC: Cranial nerves II through XII are intact. Muscle strength 5/5 in all  extremities. Sensation intact. Gait not checked.  PSYCHIATRIC: The patient is alert and oriented x 3.  SKIN: No obvious rash, lesion, or ulcer.    LABORATORY PANEL:   CBC Recent Labs  Lab 12/16/17 0542  WBC 7.4  HGB 13.5  HCT 41.9  PLT 248   ------------------------------------------------------------------------------------------------------------------  Chemistries  Recent Labs  Lab 12/14/17 2228 12/15/17 0500  12/17/17 0535  NA 138  --    < > 140  K 3.8  --    < > 3.9  CL 103  --    < > 103  CO2 26  --    < > 28  GLUCOSE 312*  --    < > 236*  BUN 21*  --    < > 15  CREATININE 1.01  --    < > 0.92  CALCIUM 8.2*  --    < > 8.7*  MG  --  2.0  --   --   AST 46*  --   --   --   ALT 57*  --   --   --   ALKPHOS 59  --   --   --   BILITOT 0.8  --   --   --    < > = values in this interval not displayed.   ------------------------------------------------------------------------------------------------------------------  Cardiac Enzymes Recent Labs  Lab 12/15/17 0500  TROPONINI 0.03*   ------------------------------------------------------------------------------------------------------------------  RADIOLOGY:  No results found.  EKG:   Orders placed or performed during the hospital encounter of 12/14/17  . EKG 12-Lead  . EKG 12-Lead  . ED EKG  . ED EKG  . EKG 12-lead  . EKG 12-Lead  . EKG 12-Lead  .  EKG    ASSESSMENT AND PLAN:     1.  Narrow complex tachycardia with a history of  WPW Patient was seen by cardiology and case discussed with Dr. Graciela Husbands Recommending to start the patient on metoprolol 50 mg twice a day  Flecainide never started. Echocardiogram is done results are pending-preliminary report 20% ejection fraction- cath on Monday.  may need enteresto and spironolactone on discharge. Follow with [facility] Dr. Graciela Husbands for possible ablation after discharge  2.  Interstitial pulmonary edema Lasix 40 mg IV given and recommending 40 mg of Lasix  once daily as renal function is slightly worse Reassess the patient tomorrow and if necessary consider IV Lasix Daily weight monitoring intake and output and strict DASH diet  Seems to be noncompliant with the diet dietary consult placed Again started back on Lasix 40 mg IV twice daily.  3.   diabetes mellitus Patient is refusing insulin as reported by the nurse reinforced the importance of compliance Checked A1c- 12.4 Possible requirement of cardiac catheterization so we will hold oral medication but keep on Levemir.  4.  Obstructive sleep apnea CPAP nightly Sleep study is pending which is scheduled as an outpatient  5.  Morbid obesity Encouraged diet and exercise   6.  Chest pain acute MI ruled out with negative troponins   All the records are reviewed and case discussed with Care Management/Social Workerr. Management plans discussed with the patient, family and they are in agreement.  CODE STATUS: fc  TOTAL TIME TAKING CARE OF THIS PATIENT: 38 minutes.   POSSIBLE D/C IN 2 DAYS, DEPENDING ON CLINICAL CONDITION.  Note: This dictation was prepared with Dragon dictation along with smaller phrase technology. Any transcriptional errors that result from this process are unintentional.   Altamese Dilling M.D on 12/17/2017 at 2:41 PM  Between 7am to 6pm - Pager - [phone] After 6pm go to [email or web address] - password [email or web address] [facility]  [place] [facility]  Office  [phone]  CC: Primary care physician; Eustaquio Boyden, MD

## 2017-12-17 NOTE — Progress Notes (Signed)
Progress Note  Patient Name: Alvin Phillips Date of Encounter: 12/17/2017  Primary Cardiologist: Yvonne Kendall, MD  Subjective   Patient feels better today.  Less shortness of breath.  Thinks he might be able to lay flat now.  Transient chest pain overnight in the setting of a stressful phone call.  This has since resolved.  No palpitations.  He urinated frequently yesterday and feels that a large portion of his urine output was not completely recorded.  Inpatient Medications    Scheduled Meds: . aspirin  81 mg Oral Daily  . enoxaparin (LOVENOX) injection  40 mg Subcutaneous Q12H  . furosemide  40 mg Intravenous Once  . furosemide  40 mg Intravenous BID  . insulin aspart  0-20 Units Subcutaneous TID WC  . insulin aspart  0-5 Units Subcutaneous QHS  . losartan  50 mg Oral Daily  . metoprolol succinate  50 mg Oral Daily  . spironolactone  25 mg Oral Daily   Continuous Infusions:  PRN Meds: acetaminophen, bisacodyl, morphine injection, nitroGLYCERIN, ondansetron (ZOFRAN) IV, senna-docusate   Vital Signs    Vitals:   12/16/17 1952 12/16/17 2321 12/17/17 0616 12/17/17 0822  BP: 122/79 (!) 135/98 116/90 (!) 137/97  Pulse: 92 92 86 82  Resp: 16  18 18   Temp: 98.1 F (36.7 C)  97.9 F (36.6 C) 97.7 F (36.5 C)  TempSrc: Oral  Oral   SpO2: 98%  98% 99%  Weight:      Height:        Intake/Output Summary (Last 24 hours) at 12/17/2017 1131 Last data filed at 12/17/2017 4098 Gross per 24 hour  Intake 960 ml  Output 1000 ml  Net -40 ml   Filed Weights   12/14/17 2216 12/15/17 1610 12/16/17 0500  Weight: (!) 168.7 kg (!) 167.7 kg (!) 166.7 kg    Telemetry    Normal sinus rhythm with 27 beat run of nonsustained ventricular tachycardia - Personally Reviewed  ECG    No new tracing - Personally Reviewed  Physical Exam   GEN: No acute distress. Neck: JVP ~10 cm. Cardiac: RRR, no murmurs, rubs, or gallops.  Respiratory: Clear to auscultation bilaterally. GI: Soft,  nontender, non-distended  MS: Trace pretibial edema; No deformity. Neuro:  Nonfocal  Psych: Normal affect   Labs    Chemistry Recent Labs  Lab 12/12/17 1210 12/14/17 2228 12/16/17 0542 12/17/17 0535  NA 135 138 141 140  K 4.2 3.8 4.6 3.9  CL 99 103 104 103  CO2 30 26 25 28   GLUCOSE 306* 312* 221* 236*  BUN 15 21* 15 15  CREATININE 0.93 1.01 0.82 0.92  CALCIUM 9.1 8.2* 8.5* 8.7*  PROT 7.1 6.7  --   --   ALBUMIN 3.8 3.5  --   --   AST 29 46*  --   --   ALT 52 57*  --   --   ALKPHOS 59 59  --   --   BILITOT 0.8 0.8  --   --   GFRNONAA  --  >60 >60 >60  GFRAA  --  >60 >60 >60  ANIONGAP  --  9 12 9      Hematology Recent Labs  Lab 12/12/17 1210 12/14/17 2228 12/16/17 0542  WBC 7.1 8.1 7.4  RBC 4.93 4.75 4.57  HGB 14.3 14.2 13.5  HCT 44.4 42.4 41.9  MCV 90.1 89.3 91.6  MCH  --  30.0 29.5  MCHC 32.1 33.6 32.2  RDW 13.7 13.8  13.9  PLT 385.0 349 248    Cardiac Enzymes Recent Labs  Lab 12/14/17 2228 12/15/17 0254 12/15/17 0500  TROPONINI 0.03* 0.03* 0.03*   No results for input(s): TROPIPOC in the last 168 hours.   BNP Recent Labs  Lab 12/12/17 1210 12/14/17 2228  BNP  --  234.0*  PROBNP 206.0*  --      DDimer No results for input(s): DDIMER in the last 168 hours.   Radiology    No results found.  Cardiac Studies   Echo (12/15/17): - Left ventricle: The cavity size was severely dilated. There was mild concentric hypertrophy. Systolic function was severely reduced. The estimated ejection fraction was in the range of 20% to 25%. Diffuse hypokinesis. Regional wall motion abnormalities cannot be excluded. - Mitral valve: There was mild regurgitation. - Left atrium: The atrium was mildly dilated. - Right ventricle: Poorly visualized. Systolic function was mildly reduced. - Pulmonary arteries: Systolic pressure was mildly elevated. PA peak pressure: 36 mm Hg (S).  Patient Profile     37 y.o. male man with history of WPW, HTN,  uncontrolled diabetes mellitus, morbid obesity, GERD, and obstructive sleep apnea, admitted with narrow complex tachycardia and acute on chronic systolic heart failure with LVEF of 20 to 25% by echo during this admission.  Assessment & Plan    Acute on chronic systolic heart failure Patient is net even since yesterday but reports uncharted urine output.  He feels like his breathing and orthopnea are better.  Weight not yet recorded today.  Continue furosemide 40 mg IV twice daily.  Will hold tomorrow morning's dose in preparation for catheterization.  Add Spironolactone 25 mg daily.  Continue metoprolol succinate 50 mg daily and losartan 50 mg daily.  Plan for left and right heart catheterization tomorrow.  Risks and benefits discussed with patient, who agrees to proceed.  Nonsustained ventricular tachycardia Telemetry reviewed and most suggestive of NSVT, though SVT with aberrancy is a possibility.  Continue metoprolol succinate 50 mg daily.  As spironolactone, which will hopefully limit potassium loss.  Maintain potassium and magnesium greater than 4.0 and 2.0, respectively.  Wolff-Parkinson-White syndrome No sustained arrhythmia.  Continue metoprolol succinate 50 mg daily.  Defer addition of flecainide for now, as discussed yesterday.  EP consult if patient is still in the hospital on Tuesday with Dr. Graciela Husbands.  Otherwise, expedited outpatient EP consultation in [place] to discuss accessory pathway ablation.  Chest pain Atypical and most likely not related to coronary insufficiency.  However, given severely reduced LVEF, exclusion of CAD is warranted.  Left and right heart catheterization tomorrow.  Continue aspirin 81 mg daily.  Given LDL less than 100, defer statin therapy unless significant CAD is identified by cath.  For questions or updates, please contact [facility] Please consult [email or web address] for contact info under [facility].    Signed, Yvonne Kendall,  MD  12/17/2017, 11:31 AM

## 2017-12-18 ENCOUNTER — Encounter
Admission: EM | Disposition: A | Discharge status: Other Acute Inpatient Hospital | Payer: Self-pay | Source: Home / Self Care | Attending: Internal Medicine

## 2017-12-18 HISTORY — PX: RIGHT/LEFT HEART CATH AND CORONARY ANGIOGRAPHY: CATH118266

## 2017-12-18 LAB — CBC
HEMATOCRIT: 42.4 % (ref 40.0–52.0)
Hemoglobin: 14.2 g/dL (ref 13.0–18.0)
MCH: 30.3 pg (ref 26.0–34.0)
MCHC: 33.6 g/dL (ref 32.0–36.0)
MCV: 90.4 fL (ref 80.0–100.0)
Platelets: 317 10*3/uL (ref 150–440)
RBC: 4.7 MIL/uL (ref 4.40–5.90)
RDW: 13.7 % (ref 11.5–14.5)
WBC: 6.3 10*3/uL (ref 3.8–10.6)

## 2017-12-18 LAB — BASIC METABOLIC PANEL
ANION GAP: 10 (ref 5–15)
BUN: 19 mg/dL (ref 6–20)
CO2: 28 mmol/L (ref 22–32)
Calcium: 8.7 mg/dL — ABNORMAL LOW (ref 8.9–10.3)
Chloride: 101 mmol/L (ref 98–111)
Creatinine, Ser: 0.85 mg/dL (ref 0.61–1.24)
GFR calc Af Amer: >60 mL/min (ref >60)
GFR calc non Af Amer: >60 mL/min (ref >60)
GLUCOSE: 268 mg/dL — AB (ref 70–99)
POTASSIUM: 4.2 mmol/L (ref 3.5–5.1)
Sodium: 139 mmol/L (ref 135–145)

## 2017-12-18 LAB — GLUCOSE, CAPILLARY
GLUCOSE-CAPILLARY: 238 mg/dL — AB (ref 70–99)
Glucose-Capillary: 195 mg/dL — ABNORMAL HIGH (ref 70–99)
Glucose-Capillary: 216 mg/dL — ABNORMAL HIGH (ref 70–99)
Glucose-Capillary: 187 mg/dL — ABNORMAL HIGH (ref 70–99)
Glucose-Capillary: 228 mg/dL — ABNORMAL HIGH (ref 70–99)

## 2017-12-18 SURGERY — RIGHT/LEFT HEART CATH AND CORONARY ANGIOGRAPHY
Anesthesia: Moderate Sedation

## 2017-12-18 MED ORDER — SODIUM CHLORIDE 0.9% FLUSH
3.0000 mL | Freq: Two times a day (BID) | INTRAVENOUS | Status: DC
  Administered 2017-12-18 – 2017-12-21 (×7): 3 mL via INTRAVENOUS

## 2017-12-18 MED ORDER — SODIUM CHLORIDE 0.9% FLUSH
3.0000 mL | INTRAVENOUS | Status: DC | PRN
  Administered 2017-12-21: 3 mL via INTRAVENOUS
  Filled 2017-12-18: qty 3

## 2017-12-18 MED ORDER — MIDAZOLAM HCL 2 MG/2ML IJ SOLN
INTRAMUSCULAR | Status: DC | PRN
  Administered 2017-12-18: 1 mg via INTRAVENOUS

## 2017-12-18 MED ORDER — FENTANYL CITRATE (PF) 100 MCG/2ML IJ SOLN
INTRAMUSCULAR | Status: AC
  Filled 2017-12-18: qty 2

## 2017-12-18 MED ORDER — METOPROLOL SUCCINATE ER 50 MG PO TB24
ORAL_TABLET | ORAL | Status: AC
  Filled 2017-12-18: qty 1

## 2017-12-18 MED ORDER — FENTANYL CITRATE (PF) 100 MCG/2ML IJ SOLN
INTRAMUSCULAR | Status: DC | PRN
  Administered 2017-12-18: 50 ug via INTRAVENOUS

## 2017-12-18 MED ORDER — MIDAZOLAM HCL 2 MG/2ML IJ SOLN
INTRAMUSCULAR | Status: AC
  Filled 2017-12-18: qty 2

## 2017-12-18 MED ORDER — HEPARIN SODIUM (PORCINE) 1000 UNIT/ML IJ SOLN
INTRAMUSCULAR | Status: AC
  Filled 2017-12-18: qty 1

## 2017-12-18 MED ORDER — FUROSEMIDE 10 MG/ML IJ SOLN
40.0000 mg | Freq: Two times a day (BID) | INTRAMUSCULAR | Status: AC
  Administered 2017-12-18: 40 mg via INTRAVENOUS
  Filled 2017-12-18: qty 4

## 2017-12-18 MED ORDER — HEPARIN (PORCINE) IN NACL 1000-0.9 UT/500ML-% IV SOLN
INTRAVENOUS | Status: AC
  Filled 2017-12-18: qty 1000

## 2017-12-18 MED ORDER — VERAPAMIL HCL 2.5 MG/ML IV SOLN
INTRAVENOUS | Status: DC | PRN
  Administered 2017-12-18: 2.5 mg via INTRA_ARTERIAL

## 2017-12-18 MED ORDER — SODIUM CHLORIDE 0.9 % IV SOLN: 250.0000 mL | INTRAVENOUS | Status: DC | PRN

## 2017-12-18 MED ORDER — VERAPAMIL HCL 2.5 MG/ML IV SOLN
INTRAVENOUS | Status: AC
  Filled 2017-12-18: qty 2

## 2017-12-18 MED ORDER — SACUBITRIL-VALSARTAN 24-26 MG PO TABS
1.0000 | ORAL_TABLET | Freq: Two times a day (BID) | ORAL | Status: DC
  Administered 2017-12-18 – 2017-12-21 (×6): 1 via ORAL
  Filled 2017-12-18 (×6): qty 1

## 2017-12-18 SURGICAL SUPPLY — 9 items
CATH BALLN WEDGE 5F 110CM (CATHETERS) ×2 IMPLANT
CATH INFINITI 5FR JK (CATHETERS) ×2 IMPLANT
DEVICE RAD COMP TR BAND LRG (VASCULAR PRODUCTS) ×2 IMPLANT
GLIDESHEATH SLEND SS 6F .021 (SHEATH) ×2 IMPLANT
KIT MANI 3VAL PERCEP (MISCELLANEOUS) ×2 IMPLANT
KIT RIGHT HEART (MISCELLANEOUS) ×2 IMPLANT
PACK CARDIAC CATH (CUSTOM PROCEDURE TRAY) ×2 IMPLANT
SHEATH GLIDE SLENDER 4/5FR (SHEATH) ×2 IMPLANT
WIRE ROSEN-J .035X260CM (WIRE) ×2 IMPLANT

## 2017-12-18 NOTE — Plan of Care (Signed)
  Problem: Activity: Goal: Risk for activity intolerance will decrease Outcome: Progressing Note:  Up to bathroom independently,tolerating well   Problem: Nutrition: Goal: Adequate nutrition will be maintained Outcome: Progressing   Problem: Coping: Goal: Level of anxiety will decrease Outcome: Progressing   Problem: Elimination: Goal: Will not experience complications related to urinary retention Outcome: Progressing   Problem: Pain Managment: Goal: General experience of comfort will improve Outcome: Progressing Note:  Complained of should pain, treated with tylenol which gave relief   Problem: Safety: Goal: Ability to remain free from injury will improve Outcome: Progressing   Problem: Education: Goal: Knowledge of General Education information will improve Description Including pain rating scale, medication(s)/side effects and non-pharmacologic comfort measures Outcome: Completed/Met

## 2017-12-18 NOTE — Progress Notes (Signed)
[facility] - [place] at [facility]   PATIENT NAME: Alvin Phillips    MR#:  [id number]  DATE OF BIRTH:  September 07, 1980  SUBJECTIVE:  CHIEF COMPLAINT: Patient is short of breath but denies any chest pain.  Had good sleep last night. Status post cardiac cath where no intervention was done but found to have decreased LV function and still overload.  REVIEW OF SYSTEMS:  CONSTITUTIONAL: No fever, fatigue or weakness.  EYES: No blurred or double vision.  EARS, NOSE, AND THROAT: No tinnitus or ear pain.  RESPIRATORY: No cough, reporting worsening of shortness of breath lately, denies wheezing or hemoptysis.  CARDIOVASCULAR: No chest pain, orthopnea, edema.  GASTROINTESTINAL: No nausea, vomiting, diarrhea or abdominal pain.  GENITOURINARY: No dysuria, hematuria.  ENDOCRINE: No polyuria, nocturia,  HEMATOLOGY: No anemia, easy bruising or bleeding SKIN: No rash or lesion. MUSCULOSKELETAL: No joint pain or arthritis.   NEUROLOGIC: No tingling, numbness, weakness.  PSYCHIATRY: No anxiety or depression.   DRUG ALLERGIES:  No Known Allergies  VITALS:  Blood pressure 136/84, pulse 88, temperature (!) 97.4 F (36.3 C), temperature source Oral, resp. rate 18, height 5\' 10"  (1.778 m), weight (!) 163.4 kg, SpO2 100 %.  PHYSICAL EXAMINATION:  GENERAL:  37 y.o.-year-old patient lying in the bed with no acute distress.  Morbidly obese EYES: Pupils equal, round, reactive to light and accommodation. No scleral icterus. Extraocular muscles intact.  HEENT: Head atraumatic, normocephalic. Oropharynx and nasopharynx clear.  NECK:  Supple, no jugular venous distention. No thyroid enlargement, no tenderness.  LUNGS: Diminished breath sounds bilaterally, no wheezing, patient has rales,  crepitation. No use of accessory muscles of respiration.  CARDIOVASCULAR: S1, S2 normal. No murmurs, rubs, or gallops.  ABDOMEN: Soft, nontender, nondistended. Bowel sounds present. No organomegaly or mass.   EXTREMITIES: some pedal edema, cyanosis, or clubbing.  NEUROLOGIC: Cranial nerves II through XII are intact. Muscle strength 5/5 in all extremities. Sensation intact. Gait not checked.  PSYCHIATRIC: The patient is alert and oriented x 3.  SKIN: No obvious rash, lesion, or ulcer.    LABORATORY PANEL:   CBC Recent Labs  Lab 12/18/17 0633  WBC 6.3  HGB 14.2  HCT 42.4  PLT 317   ------------------------------------------------------------------------------------------------------------------  Chemistries  Recent Labs  Lab 12/14/17 2228 12/15/17 0500  12/18/17 0633  NA 138  --    < > 139  K 3.8  --    < > 4.2  CL 103  --    < > 101  CO2 26  --    < > 28  GLUCOSE 312*  --    < > 268*  BUN 21*  --    < > 19  CREATININE 1.01  --    < > 0.85  CALCIUM 8.2*  --    < > 8.7*  MG  --  2.0  --   --   AST 46*  --   --   --   ALT 57*  --   --   --   ALKPHOS 59  --   --   --   BILITOT 0.8  --   --   --    < > = values in this interval not displayed.   ------------------------------------------------------------------------------------------------------------------  Cardiac Enzymes Recent Labs  Lab 12/15/17 0500  TROPONINI 0.03*   ------------------------------------------------------------------------------------------------------------------  RADIOLOGY:  No results found.  EKG:   Orders placed or performed during the hospital encounter of 12/14/17  . EKG 12-Lead  .  EKG 12-Lead  . ED EKG  . ED EKG  . EKG 12-lead  . EKG 12-Lead  . EKG 12-Lead  . EKG    ASSESSMENT AND PLAN:     1.  Narrow complex tachycardia with a history of  WPW Patient was seen by cardiology and case discussed with Dr. Graciela Husbands Recommending to start the patient on metoprolol 50 mg twice a day  Flecainide never started. Echocardiogram is done results are pending-preliminary report 20% ejection fraction- cath done, no coronary artery disease, no stent placed but noted to have still volume  overload so suggested to continue IV diuresis 1 more day.  Follow with [facility] Dr. Graciela Husbands for possible ablation after discharge  2.  Interstitial pulmonary edema-acute systolic congestive heart failure Nonischemic cardiomyopathy Lasix 40 mg IV given and recommending 40 mg of Lasix once daily as renal function is slightly worse Daily weight monitoring intake and output and strict DASH diet  Seems to be noncompliant with the diet dietary consult placed Again started back on Lasix 40 mg IV twice daily.  3.   diabetes mellitus Patient is refusing insulin as reported by the nurse reinforced the importance of compliance Checked A1c- 12.4 Severe cardiomyopathy - keep on Levemir.  4.  Obstructive sleep apnea CPAP nightly Sleep study is pending which is scheduled as an outpatient  5.  Morbid obesity Encouraged diet and exercise   6.  Chest pain acute MI ruled out with negative troponins   All the records are reviewed and case discussed with Care Management/Social Workerr. Management plans discussed with the patient, family and they are in agreement.  CODE STATUS: fc  TOTAL TIME TAKING CARE OF THIS PATIENT: 35 minutes.   POSSIBLE D/C IN 1 DAYS, DEPENDING ON CLINICAL CONDITION.  Note: This dictation was prepared with Dragon dictation along with smaller phrase technology. Any transcriptional errors that result from this process are unintentional.   Altamese Dilling M.D on 12/18/2017 at 3:56 PM  Between 7am to 6pm - Pager - [phone] After 6pm go to [email or web address] - password [email or web address] [facility]  [place] [facility]  Office  [phone]  CC: Primary care physician; Eustaquio Boyden, MD

## 2017-12-18 NOTE — Progress Notes (Addendum)
Inpatient Diabetes Program Recommendations  AACE/ADA: New Consensus Statement on Inpatient Glycemic Control (2015)  Target Ranges:  Prepandial:   less than 140 mg/dL      Peak postprandial:   less than 180 mg/dL (1-2 hours)      Critically ill patients:  140 - 180 mg/dL   Results for Alvin Phillips, Alvin Phillips (MRN [id number]) as of 12/18/2017 09:15  Ref. Range 12/17/2017 08:19 12/17/2017 11:40 12/17/2017 16:41 12/17/2017 21:04  Glucose-Capillary Latest Ref Range: 70 - 99 mg/dL 204 (H)  7 units NOVOLOG  264 (H)  11 units NOVOLOG  195 (H)  11 units NOVOLOG  231 (H)  2 units NOVOLOG    Results for Alvin Phillips, Alvin Phillips (MRN [id number]) as of 12/18/2017 09:15  Ref. Range 12/18/2017 08:03  Glucose-Capillary Latest Ref Range: 70 - 99 mg/dL 216 (H)  7 units NOVOLOG    Results for Alvin Phillips, Alvin Phillips (MRN [id number]) as of 12/18/2017 09:15  Ref. Range 02/21/2017 16:21 10/30/2017 16:01 12/16/2017 05:42  Hemoglobin A1C Latest Ref Range: 4.8 - 5.6 % 12.3 Repeated and verified (H) 12.3 (A) 12.4 (H)  Average glucose 309 mg/dl    Admit with: SOB, Tachycardia/palpitations, CP, Hyperglycemia  History: DM  Home DM Meds: Metformin 750 mg Daily  Current Orders: Levemir 10 units Daily      Novolog Resistant Correction Scale/ SSI (0-20 units) TID AC + HS      Was scheduled to start Levemir yesterday AM, but dose documented as NOT being given.  Next dose 10 units Levemir due this AM- RN to give after procedure today.  Current A1c of 12.4% shows very poor glucose control at home.  Looks like patient has chronically uncontrolled Glucose at home given last few A1c levels on file have been >12% consistently.  PCP: Ria Bush with [facility]--Last seen 12/12/2017.  At that PCP visit, patient refused to start taking Insulin at home.   Addendum 11:42am- Met with patient this AM with Mom present in room (pt gave me permission to speak with him in front of Mom).  Patient stated to me he just saw his PCP (Dr. Danise Mina)  a few days before admission to hospital.  Told me that he does not want to start insulin.  Also told me PCP increased his Metformin to 750 mg daily just the other day.  Has not been checking CBGs (states he needs a new battery for his meter).  Is willing to speak to a dietitian this visit to discuss healthy eating for diabetes at home.  Also willing to see a diabetes educator as an outpatient at [facility].  Referral placed to the [facility] center.  Spoke with patient about his current A1c of 12.4%.  Explained what an A1c is and what it measures.  Reminded patient that his goal A1c is 7% or less per ADA standards to prevent both acute and long-term complications.  Explained to patient the extreme importance of good glucose control at home.  Encouraged patient to check his CBGs at least bid at home (fasting and another check within the day) and to record all CBGs in a logbook for his PCP to review.      --Will follow patient during hospitalization--  Wyn Quaker RN, MSN, CDE Diabetes Coordinator Inpatient Glycemic Control Team Team Pager: [phone] (8a-5p)

## 2017-12-18 NOTE — Progress Notes (Signed)
Progress Note  Patient Name: Alvin Phillips Date of Encounter: 12/18/2017  Primary Cardiologist: Yvonne Kendall, MD   Subjective   The patient reports improvement in shortness of breath.  No chest pain.  Inpatient Medications    Scheduled Meds: . aspirin  81 mg Oral Daily  . enoxaparin (LOVENOX) injection  40 mg Subcutaneous Q12H  . furosemide  40 mg Intravenous Once  . insulin aspart  0-20 Units Subcutaneous TID WC  . insulin aspart  0-5 Units Subcutaneous QHS  . insulin detemir  10 Units Subcutaneous Daily  . losartan  50 mg Oral Daily  . metoprolol succinate  50 mg Oral Daily  . sodium chloride flush  3 mL Intravenous Q12H  . spironolactone  25 mg Oral Daily   Continuous Infusions: . sodium chloride    . sodium chloride     PRN Meds: sodium chloride, acetaminophen, bisacodyl, morphine injection, nitroGLYCERIN, ondansetron (ZOFRAN) IV, senna-docusate, sodium chloride flush   Vital Signs    Vitals:   12/17/17 1639 12/17/17 1953 12/18/17 0515 12/18/17 0802  BP: (!) 137/93 (!) 127/98 129/86 (!) 128/95  Pulse: 98 87 85 86  Resp: 18 18 19    Temp:  97.8 F (36.6 C) 97.6 F (36.4 C) (!) 97.4 F (36.3 C)  TempSrc:  Oral Oral Oral  SpO2: 97% 99% 99% 98%  Weight:   (!) 163.4 kg   Height:        Intake/Output Summary (Last 24 hours) at 12/18/2017 1056 Last data filed at 12/18/2017 0700 Gross per 24 hour  Intake 603 ml  Output 700 ml  Net -97 ml   Filed Weights   12/15/17 1610 12/16/17 0500 12/18/17 0515  Weight: (!) 167.7 kg (!) 166.7 kg (!) 163.4 kg    Telemetry    Normal sinus rhythm with no recurrent arrhythmia- Personally Reviewed  ECG    Not done today.- Personally Reviewed  Physical Exam   GEN: No acute distress.   Neck: No JVD Cardiac: RRR, no murmurs, rubs, or gallops.  Respiratory: Clear to auscultation bilaterally. GI: Soft, nontender, non-distended  MS: No edema; No deformity. Neuro:  Nonfocal  Psych: Normal affect  Right radial pulses  normal.  Labs    Chemistry Recent Labs  Lab 12/12/17 1210  12/14/17 2228 12/16/17 0542 12/17/17 0535 12/18/17 0633  NA 135  --  138 141 140 139  K 4.2  --  3.8 4.6 3.9 4.2  CL 99  --  103 104 103 101  CO2 30  --  26 25 28 28   GLUCOSE 306*  --  312* 221* 236* 268*  BUN 15  --  21* 15 15 19   CREATININE 0.93  --  1.01 0.82 0.92 0.85  CALCIUM 9.1  --  8.2* 8.5* 8.7* 8.7*  PROT 7.1  --  6.7  --   --   --   ALBUMIN 3.8  --  3.5  --   --   --   AST 29  --  46*  --   --   --   ALT 52  --  57*  --   --   --   ALKPHOS 59  --  59  --   --   --   BILITOT 0.8  --  0.8  --   --   --   GFRNONAA  --    < > >60 >60 >60 >60  GFRAA  --    < > >60 >60 >60 >  60  ANIONGAP  --    < > 9 12 9 10    < > = values in this interval not displayed.     Hematology Recent Labs  Lab 12/14/17 2228 12/16/17 0542 12/18/17 0633  WBC 8.1 7.4 6.3  RBC 4.75 4.57 4.70  HGB 14.2 13.5 14.2  HCT 42.4 41.9 42.4  MCV 89.3 91.6 90.4  MCH 30.0 29.5 30.3  MCHC 33.6 32.2 33.6  RDW 13.8 13.9 13.7  PLT 349 248 317    Cardiac Enzymes Recent Labs  Lab 12/14/17 2228 2017/12/16 0254 12/16/17 0500  TROPONINI 0.03* 0.03* 0.03*   No results for input(s): TROPIPOC in the last 168 hours.   BNP Recent Labs  Lab 12/12/17 1210 12/14/17 2228  BNP  --  234.0*  PROBNP 206.0*  --      DDimer No results for input(s): DDIMER in the last 168 hours.   Radiology    No results found.  Cardiac Studies   Echocardiogram done on 12/17/2022:  - Left ventricle: The cavity size was severely dilated. There was   mild concentric hypertrophy. Systolic function was severely   reduced. The estimated ejection fraction was in the range of 20%   to 25%. Diffuse hypokinesis. Regional wall motion abnormalities   cannot be excluded. - Mitral valve: There was mild regurgitation. - Left atrium: The atrium was mildly dilated. - Right ventricle: Poorly visualized. Systolic function was mildly   reduced. - Pulmonary arteries:  Systolic pressure was mildly elevated. PA   peak pressure: 36 mm Hg (S).  Patient Profile     37 y.o. male with history of WPW, HTN, uncontrolled diabetes mellitus, morbid obesity, GERD, and obstructive sleep apnea, admitted with narrow complex tachycardia and acute on chronic systolic heart failure with LVEF of 20 to 25% by echo during this admission.  Assessment & Plan    1.  Acute on chronic systolic heart failure: Severely reduced LV systolic function could be due to tachycardia induced cardiomyopathy. The patient is scheduled for a right and left cardiac catheterization today which was discussed with him again.  He is agreeable to proceed. Further recommendations to follow after cardiac catheterization. Continue treatment with losartan, metoprolol and spironolactone. I will consider increasing the dose of Toprol.  2.  Recurrent wide-complex tachycardia likely SVT with aberrancy: No recurrent arrhythmia.  3.  Wolff-Parkinson-White syndrome: Continue beta-blocker.  EP consultation in the near future for ablation.        For questions or updates, please contact [facility] Please consult [email or web address] for contact info under        Signed, Lorine Bears, MD  12/18/2017, 10:56 AM

## 2017-12-18 NOTE — Discharge Instructions (Signed)
Fingerstick glucose (sugar) goals for home: Before meals: 80-130 mg/dl 2-Hours after meals: less than 180 mg/dl Hemoglobin I2M goal: 7% or less  Check your sugars at least 2 times per day:   1st thing in the AM  AND  2nd check of the day either before Lunch or Dinner OR 2 hours after a meal (after Breakfast/ [employer])  Record all blood sugars for your PCP Dr. Sharen Hones

## 2017-12-19 ENCOUNTER — Telehealth: Payer: Self-pay | Admitting: Internal Medicine

## 2017-12-19 ENCOUNTER — Encounter: Payer: Self-pay | Admitting: Cardiovascular Disease

## 2017-12-19 ENCOUNTER — Other Ambulatory Visit: Payer: 59

## 2017-12-19 DIAGNOSIS — I428 Other cardiomyopathies: Secondary | ICD-10-CM

## 2017-12-19 LAB — GLUCOSE, CAPILLARY
GLUCOSE-CAPILLARY: 251 mg/dL — AB (ref 70–99)
GLUCOSE-CAPILLARY: 300 mg/dL — AB (ref 70–99)
Glucose-Capillary: 234 mg/dL — ABNORMAL HIGH (ref 70–99)
Glucose-Capillary: 212 mg/dL — ABNORMAL HIGH (ref 70–99)

## 2017-12-19 LAB — BASIC METABOLIC PANEL
ANION GAP: 10 (ref 5–15)
BUN: 19 mg/dL (ref 6–20)
CHLORIDE: 102 mmol/L (ref 98–111)
CO2: 26 mmol/L (ref 22–32)
Calcium: 9 mg/dL (ref 8.9–10.3)
Creatinine, Ser: 0.8 mg/dL (ref 0.61–1.24)
GFR calc Af Amer: >60 mL/min (ref >60)
GLUCOSE: 242 mg/dL — AB (ref 70–99)
POTASSIUM: 4.7 mmol/L (ref 3.5–5.1)
Sodium: 138 mmol/L (ref 135–145)

## 2017-12-19 MED ORDER — INSULIN DETEMIR 100 UNIT/ML ~~LOC~~ SOLN
15.0000 [IU] | Freq: Every day | SUBCUTANEOUS | Status: DC
  Filled 2017-12-19: qty 0.15

## 2017-12-19 MED ORDER — FUROSEMIDE 10 MG/ML IJ SOLN
80.0000 mg | Freq: Two times a day (BID) | INTRAMUSCULAR | Status: DC
  Administered 2017-12-19 (×2): 80 mg via INTRAVENOUS
  Filled 2017-12-19 (×2): qty 8

## 2017-12-19 MED ORDER — PREMIER PROTEIN SHAKE
11.0000 [oz_av] | Freq: Two times a day (BID) | ORAL | Status: DC
  Administered 2017-12-19 – 2017-12-21 (×5): 11 [oz_av] via ORAL

## 2017-12-19 NOTE — Care Management Note (Signed)
Case Management Note  Patient Details  Name: Alvin Phillips MRN: [id number] Date of Birth: 1980/04/02  Subjective/Objective:        Patient from home and is independent.  He is newly diagnosed with CHF.  He has watched the heart failure video and has been given the CHF booklet.  States he will obtain a scale at discharge and verbalizes understanding of daily weights.  No issues with transportation or with medications.  He is current with his PCP.  He does have a meter at home to check his blood sugars.  The battery is currently dead but he states he will replace it.  On room air.     Action/Plan:Sending e-mail to heart failure clinic for appointment.     Expected Discharge Date:  12/15/17               Expected Discharge Plan:  Home/Self Care  In-House Referral:     Discharge planning Services  CM Consult  Post Acute Care Choice:    Choice offered to:     DME Arranged:    DME Agency:     HH Arranged:    HH Agency:     Status of Service:  In process, will continue to follow  If discussed at Long Length of Stay Meetings, dates discussed:    Additional Comments:  Sherren Kerns, RN 12/19/2017, 4:43 PM

## 2017-12-19 NOTE — Plan of Care (Signed)
Cath site at right wrist is at a level 0, dressing clean, dry & intact.  Problem: Activity: Goal: Risk for activity intolerance will decrease Outcome: Progressing Note:  Pt up to bathroom independently, tolerating well   Problem: Nutrition: Goal: Adequate nutrition will be maintained Outcome: Progressing   Problem: Coping: Goal: Level of anxiety will decrease Outcome: Progressing   Problem: Elimination: Goal: Will not experience complications related to urinary retention Outcome: Progressing Note:  Pt putting out good output after receiving IV lasix   Problem: Pain Managment: Goal: General experience of comfort will improve Outcome: Progressing Note:  No complaints of pain this shift   Problem: Activity: Goal: Capacity to carry out activities will improve Outcome: Progressing

## 2017-12-19 NOTE — Telephone Encounter (Signed)
Noted into patient chart  Nothing else needed.

## 2017-12-19 NOTE — Plan of Care (Signed)
Nutrition Education Note   RD consulted for nutrition education regarding diabetes.   Lab Results  Component Value Date   HGBA1C 12.4 (H) 12/16/2017    RD provided "Nutrition and Type II Diabetes" handout from the Academy of Nutrition and Dietetics. Discussed different food groups and their effects on blood sugar, emphasizing carbohydrate-containing foods. Provided list of carbohydrates and recommended serving sizes of common foods.  Discussed importance of controlled and consistent carbohydrate intake throughout the day. Provided examples of ways to balance meals/snacks and encouraged intake of high-fiber, whole grain complex carbohydrates. Teach back method used.  Expect good compliance.  Body mass index is 51.68 kg/m. Pt meets criteria for morbid obesity based on current BMI.  Current diet order is HH/CHO, patient is consuming approximately 100% of meals at this time. Labs and medications reviewed. No further nutrition interventions warranted at this time. RD contact information provided. If additional nutrition issues arise, please re-consult RD.  Betsey Holiday MS, RD, LDN Pager #- [phone] Office#- [phone] After Hours Pager: [phone]

## 2017-12-19 NOTE — Progress Notes (Signed)
[facility] - [place] at [facility]   PATIENT NAME: Alvin Phillips    MR#:  [id number]  DATE OF BIRTH:  Feb 17, 1981  SUBJECTIVE:  CHIEF COMPLAINT: Patient is short of breath but denies any chest pain.  Had good sleep last night. Status post cardiac cath where no intervention was done but found to have decreased LV function and still overload. Cardiac cath done and it showed there was still fluid overload so cardiology suggested to continue IV Lasix for now.  REVIEW OF SYSTEMS:  CONSTITUTIONAL: No fever, fatigue or weakness.  EYES: No blurred or double vision.  EARS, NOSE, AND THROAT: No tinnitus or ear pain.  RESPIRATORY: No cough, reporting worsening of shortness of breath lately, denies wheezing or hemoptysis.  CARDIOVASCULAR: No chest pain, orthopnea, edema.  GASTROINTESTINAL: No nausea, vomiting, diarrhea or abdominal pain.  GENITOURINARY: No dysuria, hematuria.  ENDOCRINE: No polyuria, nocturia,  HEMATOLOGY: No anemia, easy bruising or bleeding SKIN: No rash or lesion. MUSCULOSKELETAL: No joint pain or arthritis.   NEUROLOGIC: No tingling, numbness, weakness.  PSYCHIATRY: No anxiety or depression.   DRUG ALLERGIES:  No Known Allergies  VITALS:  Blood pressure 133/88, pulse 88, temperature 98.2 F (36.8 C), resp. rate 20, height 5\' 10"  (1.778 m), weight (!) 163.4 kg, SpO2 97 %.  PHYSICAL EXAMINATION:  GENERAL:  37 y.o.-year-old patient lying in the bed with no acute distress.  Morbidly obese EYES: Pupils equal, round, reactive to light and accommodation. No scleral icterus. Extraocular muscles intact.  HEENT: Head atraumatic, normocephalic. Oropharynx and nasopharynx clear.  NECK:  Supple, no jugular venous distention. No thyroid enlargement, no tenderness.  LUNGS: Diminished breath sounds bilaterally, no wheezing, patient has rales,  crepitation. No use of accessory muscles of respiration.  CARDIOVASCULAR: S1, S2 normal. No murmurs, rubs, or gallops.   ABDOMEN: Soft, nontender, nondistended. Bowel sounds present. No organomegaly or mass.  EXTREMITIES: some pedal edema, cyanosis, or clubbing.  NEUROLOGIC: Cranial nerves II through XII are intact. Muscle strength 5/5 in all extremities. Sensation intact. Gait not checked.  PSYCHIATRIC: The patient is alert and oriented x 3.  SKIN: No obvious rash, lesion, or ulcer.    LABORATORY PANEL:   CBC Recent Labs  Lab 12/18/17 0633  WBC 6.3  HGB 14.2  HCT 42.4  PLT 317   ------------------------------------------------------------------------------------------------------------------  Chemistries  Recent Labs  Lab 12/14/17 2228 12/15/17 0500  12/19/17 0419  NA 138  --    < > 138  K 3.8  --    < > 4.7  CL 103  --    < > 102  CO2 26  --    < > 26  GLUCOSE 312*  --    < > 242*  BUN 21*  --    < > 19  CREATININE 1.01  --    < > 0.80  CALCIUM 8.2*  --    < > 9.0  MG  --  2.0  --   --   AST 46*  --   --   --   ALT 57*  --   --   --   ALKPHOS 59  --   --   --   BILITOT 0.8  --   --   --    < > = values in this interval not displayed.   ------------------------------------------------------------------------------------------------------------------  Cardiac Enzymes Recent Labs  Lab 12/15/17 0500  TROPONINI 0.03*   ------------------------------------------------------------------------------------------------------------------  RADIOLOGY:  No results found.  EKG:  Orders placed or performed during the hospital encounter of 12/14/17  . EKG 12-Lead  . EKG 12-Lead  . ED EKG  . ED EKG  . EKG 12-lead  . EKG 12-Lead  . EKG 12-Lead  . EKG    ASSESSMENT AND PLAN:     1.  Narrow complex tachycardia with a history of  WPW Patient was seen by cardiology and case discussed with Dr. Graciela Husbands Recommending to start the patient on metoprolol 50 mg twice a day  Flecainide never started. Echocardiogram is done results are pending-preliminary report 20% ejection fraction- cath  done, no coronary artery disease, no stent placed but noted to have still volume overload so suggested to continue IV diuresis .  Follow with [facility] Dr. Graciela Husbands for possible ablation after discharge.  2.  Interstitial pulmonary edema-acute systolic congestive heart failure Nonischemic cardiomyopathy Lasix 40 mg IV given and recommending 40 mg of Lasix once daily as renal function is slightly worse Daily weight monitoring intake and output and strict DASH diet  Seems to be noncompliant with the diet dietary consult placed Again started back on Lasix 40 mg IV twice daily.  3.   diabetes mellitus Patient is refusing insulin as reported by the nurse reinforced the importance of compliance Checked A1c- 12.4 Severe cardiomyopathy - keep on Levemir.  4.  Obstructive sleep apnea CPAP nightly Sleep study is pending which is scheduled as an outpatient  5.  Morbid obesity Encouraged diet and exercise   6.  Chest pain acute MI ruled out with negative troponins   All the records are reviewed and case discussed with Care Management/Social Workerr. Management plans discussed with the patient, family and they are in agreement.  CODE STATUS: fc  TOTAL TIME TAKING CARE OF THIS PATIENT: 35 minutes.   POSSIBLE D/C IN 1 DAYS, DEPENDING ON CLINICAL CONDITION.  Note: This dictation was prepared with Dragon dictation along with smaller phrase technology. Any transcriptional errors that result from this process are unintentional.   Altamese Dilling M.D on 12/19/2017 at 4:12 PM  Between 7am to 6pm - Pager - [phone] After 6pm go to [email or web address] - password [email or web address] [place]  [place] [facility]  Office  [phone]  CC: Primary care physician; Eustaquio Boyden, MD

## 2017-12-19 NOTE — Progress Notes (Signed)
Inpatient Diabetes Program Recommendations  AACE/ADA: New Consensus Statement on Inpatient Glycemic Control (2015)  Target Ranges:  Prepandial:   less than 140 mg/dL      Peak postprandial:   less than 180 mg/dL (1-2 hours)      Critically ill patients:  140 - 180 mg/dL   Results for Alvin Phillips, Alvin Phillips (MRN [id number]) as of 12/19/2017 07:37  Ref. Range 12/18/2017 08:03 12/18/2017 11:31 12/18/2017 15:50 12/18/2017 20:15  Glucose-Capillary Latest Ref Range: 70 - 99 mg/dL 545 (H) 625 (H) 638 (H) 228 (H)   Results for Alvin Phillips, Alvin Phillips (MRN [id number]) as of 12/19/2017 07:37  Ref. Range 12/19/2017 04:19  Glucose Latest Ref Range: 70 - 99 mg/dL 811 (H)   Results for Alvin Phillips, Alvin Phillips (MRN [id number]) as of 12/19/2017 07:37  Ref. Range 02/21/2017 16:21 10/30/2017 16:01 12/16/2017 05:42  Hemoglobin A1C Latest Ref Range: 4.8 - 5.6 % 12.3 Repeated and verified X2. (H) 12.3 (A) 12.4 (H)   Admit with: SOB, Tachycardia/palpitations, CP, Hyperglycemia  History: DM  Home DM Meds: Metformin 750 mg Daily  Current Orders: Levemir 10 units Daily                            Novolog Resistant Correction Scale/ SSI (0-20 units) TID AC + HS     Received 1st dose of Levemir yesterday at 4pm after Cardiac Cath.  Lab glucose still elevated this AM.  Had conversation with pt (yesterday) about his elevated A1c, poor glucose control at home, need for better glucose management.  Unsure patient will be willing to take insulin at home.  Stated to me yesterday that his PCP recently increased his dose of Metformin.  I'm not sure that Metformin alone will make a big impact on his glucose levels given his weight and co-morbidities.  Will try to revisit with patient today and see if he would be willing to change his mind about taking insulin at home.    MD- Please consider the following in-hospital insulin adjustments:  Increase Levemir to 16 units Daily (0.1 units/kg dosing based on weight of 163 kg)  If patient has already  received current dose (10 units) this AM, please place order for pt to receive an extra 6 units X 1 dose this AM.     --Will follow patient during hospitalization--  Ambrose Finland RN, MSN, CDE Diabetes Coordinator Inpatient Glycemic Control Team Team Pager: [phone] (8a-5p)

## 2017-12-19 NOTE — Telephone Encounter (Signed)
-----   Message from Edwena Blow sent at 12/19/2017 12:23 PM EDT ----- Regarding: FW: Echo appt This pt did not show for his echo today. Good thing as Dr End did not need to have it repeated after the 12/15/17 one. I have canceled the echo order and appt but wanted to make sure that he will no be rescheduled/called back. Thanks. GJ ----- Message ----- From: Yvonne Kendall, MD Sent: 12/19/2017  12:07 PM EDT To: Edwena Blow Subject: RE: Echo appt                                  He is currently hospitalized.  Outpatient echo was ordered before this admission, so there is no need to repeat it right now.  Thanks.  Thayer Ohm  ----- Message ----- From: Edwena Blow Sent: 12/19/2017  11:45 AM EDT To: Yvonne Kendall, MD Subject: Echo appt                                      FYI..I assumed that you wanted a complete echo on this pt despite him having had one a few days ago.  He did not show up for his echo today so its a moot point.  If you could, please let me know if you indeed need a complete study, assuming he will resch in the future  Thanks, GJ

## 2017-12-19 NOTE — Progress Notes (Signed)
Met with pt again today to discuss current A1c of 12.4% and to assess his readiness/willingness to take insulin at home.  Patient told me he has not been checking his CBGs on a regular basis nor taking his Metformin as ordered by his PCP.  Met with PCP (Dr. Danise Mina) on 12/12/2017. Looks as if PCP wanted to start patient on insulin perhaps, however, patient told PCP he was unwilling to start insulin.  PCP instead decided to increase pt's dose of Metformin to 750 mg (per chart review, looks as if Dr. Danise Mina wanted pt to try Metformin XR 750 mg Daily with the option to increase the dose to BID).    Patient told me today he does not like needles and does not want to start insulin yet.  Told me he has not been taking his Metformin at home and has not been checking CBGs.  Has not been following a diabetes nutrition plan nor exercising.  Wants a "fair shot" at trying to do better with the Metformin, diet, and exercise before starting insulin at home.    Discussed with pt that given his age and heart issues, I am concerned about his glucose control and how that affects his heart as well.  Strongly encouraged pt to regularly take his meds and try to follow a diet specific diet.  Also discussed with pt the importance of mild to moderate weight loss and how that will help both his CBGs and heart disease.  Pt stated he is motivated and ready to make positive changes at home.  Will have follow up appt with Dr. Danise Mina after d/c.  Plans to scheduled a 3 month follow up with Dr. Danise Mina to recheck an A1c.  Told me that if his A1c isn't any better in 3 months than he would be willing to try insulin at home through PCP office.    --Will follow patient during hospitalization--  Wyn Quaker RN, MSN, CDE Diabetes Coordinator Inpatient Glycemic Control Team Team Pager: [phone] (8a-5p)

## 2017-12-19 NOTE — Progress Notes (Signed)
Progress Note  Patient Name: Alvin Phillips Date of Encounter: 12/19/2017  Primary Cardiologist: Yvonne Kendall, MD  Subjective   Breathing steadily improving - not quite @ baseline.  Good response to lasix last night.  R wrist/brachial cath sites ok.  Inpatient Medications    Scheduled Meds: . aspirin  81 mg Oral Daily  . furosemide  80 mg Intravenous BID  . insulin aspart  0-20 Units Subcutaneous TID WC  . insulin aspart  0-5 Units Subcutaneous QHS  . [START ON 12/20/2017] insulin detemir  15 Units Subcutaneous Daily  . metoprolol succinate  50 mg Oral Daily  . protein supplement shake  11 oz Oral BID BM  . sacubitril-valsartan  1 tablet Oral BID  . sodium chloride flush  3 mL Intravenous Q12H  . spironolactone  25 mg Oral Daily   Continuous Infusions: . sodium chloride     PRN Meds: sodium chloride, acetaminophen, bisacodyl, morphine injection, nitroGLYCERIN, ondansetron (ZOFRAN) IV, senna-docusate, sodium chloride flush   Vital Signs    Vitals:   12/18/17 1530 12/18/17 1551 12/18/17 1932 12/19/17 0412  BP: (!) 129/108 136/84 (!) 128/91 132/87  Pulse: 89 88 92 89  Resp: 20 18 19  (!) 21  Temp:    97.6 F (36.4 C)  TempSrc:    Oral  SpO2: 96% 100% 97% 96%  Weight:      Height:        Intake/Output Summary (Last 24 hours) at 12/19/2017 1113 Last data filed at 12/19/2017 0859 Gross per 24 hour  Intake 3 ml  Output 2900 ml  Net -2897 ml   Filed Weights   12/15/17 1610 12/16/17 0500 12/18/17 0515  Weight: (!) 167.7 kg (!) 166.7 kg (!) 163.4 kg    Physical Exam   GEN: obese, in no acute distress.  HEENT: Grossly normal.  Neck: Supple, obese, difficult to gauge jvp.  No carotid bruits, or masses. Cardiac: RRR, no murmurs, rubs, or gallops. No clubbing, cyanosis, trace flank and ankle edema.  Radials/DP/PT 2+ and equal bilaterally. R wrist cath site w/o bleeding/bruit/hematoma. Respiratory:  Respirations regular and unlabored, diminished breath sounds  bilaterally. GI: Obese, protuberant, semi-firm with trace flank edema, nontender, BS + x 4. MS: no deformity or atrophy. Skin: warm and dry, no rash. Neuro:  Strength and sensation are intact. Psych: AAOx3.  Normal affect.  Labs    Chemistry Recent Labs  Lab 12/12/17 1210 12/14/17 2228  12/17/17 0535 12/18/17 0633 12/19/17 0419  NA 135 138   < > 140 139 138  K 4.2 3.8   < > 3.9 4.2 4.7  CL 99 103   < > 103 101 102  CO2 30 26   < > 28 28 26   GLUCOSE 306* 312*   < > 236* 268* 242*  BUN 15 21*   < > 15 19 19   CREATININE 0.93 1.01   < > 0.92 0.85 0.80  CALCIUM 9.1 8.2*   < > 8.7* 8.7* 9.0  PROT 7.1 6.7  --   --   --   --   ALBUMIN 3.8 3.5  --   --   --   --   AST 29 46*  --   --   --   --   ALT 52 57*  --   --   --   --   ALKPHOS 59 59  --   --   --   --   BILITOT 0.8 0.8  --   --   --   --  GFRNONAA  --  >60   < > >60 >60 >60  GFRAA  --  >60   < > >60 >60 >60  ANIONGAP  --  9   < > 9 10 10    < > = values in this interval not displayed.     Hematology Recent Labs  Lab 12/14/17 2228 12/16/17 0542 12/18/17 0633  WBC 8.1 7.4 6.3  RBC 4.75 4.57 4.70  HGB 14.2 13.5 14.2  HCT 42.4 41.9 42.4  MCV 89.3 91.6 90.4  MCH 30.0 29.5 30.3  MCHC 33.6 32.2 33.6  RDW 13.8 13.9 13.7  PLT 349 248 317    Cardiac Enzymes Recent Labs  Lab 12/14/17 2228 12/15/17 0254 12/15/17 0500  TROPONINI 0.03* 0.03* 0.03*     BNP Recent Labs  Lab 12/12/17 1210 12/14/17 2228  BNP  --  234.0*  PROBNP 206.0*  --      Radiology    No results found.  Telemetry    RSR, short PR ( ), delta wave consistent w/ WPW history - Personally Reviewed  Cardiac Studies   2D Echocardiogram 9.27.2019  Echocardiogram done on September 27:   - Left ventricle: The cavity size was severely dilated. There was   mild concentric hypertrophy. Systolic function was severely   reduced. The estimated ejection fraction was in the range of 20%   to 25%. Diffuse hypokinesis. Regional wall motion  abnormalities   cannot be excluded. - Mitral valve: There was mild regurgitation. - Left atrium: The atrium was mildly dilated. - Right ventricle: Poorly visualized. Systolic function was mildly   reduced. - Pulmonary arteries: Systolic pressure was mildly elevated. PA   peak pressure: 36 mm Hg (S). _____________   1.  Normal coronary arteries. 2.  Severely reduced LV systolic function by echo.  Left ventricular angiography was not performed. 3.  Right heart catheterization showed severely elevated filling pressures with pulmonary capillary wedge pressure of 33 mmHg, moderate to severe pulmonary hypertension and moderately reduced cardiac output at 4.8 with a cardiac index of 1.79.   Recommendations: The patient has nonischemic cardiomyopathy.  He continues to be significantly volume overloaded.  I recommend continuing IV diuresis for at least another day.  I switch losartan to Entresto.  _____________   Patient Profile     37 y.o. male with history of WPW, HTN, uncontrolled diabetes mellitus, morbid obesity, GERD, and obstructive sleep apnea, admitted with SVT/WPW and progressive dyspnea  acute systolic heart failure with LVEF of 20 to 25% by echo during this admission.   Assessment & Plan    1.  Acute systolic CHF/NICM:  Admitted with a several wk h/o progressive dyspnea and in the setting of a 1-2 hr h/o SVT/WPW.  EF 20-25%.  Cath 9/30, revealed nl cors w/ elevated filling pressures (PCWP of 33) and mod reduced CO/CI (4.8/1.79).  Given IV lasix last night w/ good response  -1917 overnight and -2399 for admission.  Wt is down ~ 3 kg since admission (not yet recorded this AM). Body habitus makes exam challenging but his abd remains semi-firm and he has trace flank and ankle edema.  Renal fxn stable.  We will escalate diuresis this am  lasix 80 IV bid.  Will plan to diurese until we see sl bump in creat given challenging nature of exam and unclear prior dry wt.  Cont  blocker, entresto  (started last night), and spriro. Will consider titrating entresto tomorrow depending upon how his pressure does with it today.  2.  SVT/WPW:  Presented w/ SVT @ 190.  Though he has had palpitations in the past, he had never had an episode as severe/prolonged as that one.  He will ultimately require EP eval, and potentially while he is still hospitalized.  We will give him the option of f/u as outpt vs potential tx to [facility] for further diuresis and inpt EP eval and EPS.  No recurrence since admission.  Cont  blocker.  3.  DM II:  Uncontrolled.  A1c 12.4.  Insulin per IM.  4.  Essential HTN:  Poorly controlled previously - likely driving for behind NICM.  Much improved with initiation of  blocker, mra, arni.  5.  Morbid obesity:  Will benefit from cardiopulmonary rehab.  6.  Tob/Marijuana abuse: cessation advised.  Signed, Nicolasa Ducking, NP  12/19/2017, 11:13 AM    For questions or updates, please contact   Please consult [email or web address] for contact info under Cardiology/STEMI.

## 2017-12-20 LAB — GLUCOSE, CAPILLARY
GLUCOSE-CAPILLARY: 247 mg/dL — AB (ref 70–99)
Glucose-Capillary: 231 mg/dL — ABNORMAL HIGH (ref 70–99)
Glucose-Capillary: 245 mg/dL — ABNORMAL HIGH (ref 70–99)
Glucose-Capillary: 262 mg/dL — ABNORMAL HIGH (ref 70–99)

## 2017-12-20 LAB — BASIC METABOLIC PANEL
Anion gap: 9 (ref 5–15)
BUN: 18 mg/dL (ref 6–20)
CALCIUM: 8.8 mg/dL — AB (ref 8.9–10.3)
CO2: 28 mmol/L (ref 22–32)
CREATININE: 0.7 mg/dL (ref 0.61–1.24)
Chloride: 100 mmol/L (ref 98–111)
GFR calc non Af Amer: >60 mL/min (ref >60)
GLUCOSE: 255 mg/dL — AB (ref 70–99)
Potassium: 4 mmol/L (ref 3.5–5.1)
Sodium: 137 mmol/L (ref 135–145)

## 2017-12-20 MED ORDER — INSULIN DETEMIR 100 UNIT/ML ~~LOC~~ SOLN
20.0000 [IU] | Freq: Every day | SUBCUTANEOUS | Status: DC
  Administered 2017-12-20: 20 [IU] via SUBCUTANEOUS
  Filled 2017-12-20 (×2): qty 0.2

## 2017-12-20 MED ORDER — FUROSEMIDE 10 MG/ML IJ SOLN
60.0000 mg | Freq: Two times a day (BID) | INTRAMUSCULAR | Status: DC
  Administered 2017-12-20 – 2017-12-21 (×3): 60 mg via INTRAVENOUS
  Filled 2017-12-20 (×3): qty 6

## 2017-12-20 NOTE — Progress Notes (Signed)
[facility] [place] [facility]   PATIENT NAME: Alvin Phillips    MR#:  [id number]  DATE OF BIRTH:  Feb 13, 1981  SUBJECTIVE:  CHIEF COMPLAINT: Patient is short of breath but denies any chest pain.  Had good sleep last night. Status post cardiac cath where no intervention was done but found to have decreased LV function and still overload. Cardiac cath done and it showed there was still fluid overload so cardiology suggested to continue IV Lasix for now. Feels better.  REVIEW OF SYSTEMS:  CONSTITUTIONAL: No fever, fatigue or weakness.  EYES: No blurred or double vision.  EARS, NOSE, AND THROAT: No tinnitus or ear pain.  RESPIRATORY: No cough, reporting worsening of shortness of breath lately, denies wheezing or hemoptysis.  CARDIOVASCULAR: No chest pain, orthopnea, edema.  GASTROINTESTINAL: No nausea, vomiting, diarrhea or abdominal pain.  GENITOURINARY: No dysuria, hematuria.  ENDOCRINE: No polyuria, nocturia,  HEMATOLOGY: No anemia, easy bruising or bleeding SKIN: No rash or lesion. MUSCULOSKELETAL: No joint pain or arthritis.   NEUROLOGIC: No tingling, numbness, weakness.  PSYCHIATRY: No anxiety or depression.   DRUG ALLERGIES:  No Known Allergies  VITALS:  Blood pressure (!) 134/99, pulse 86, temperature (!) 97.5 F (36.4 C), temperature source Oral, resp. rate 18, height 5\' 10"  (1.778 m), weight (!) 161.6 kg, SpO2 98 %.  PHYSICAL EXAMINATION:  GENERAL:  37 y.o.-year-old obese patient sitting in the bed with no acute distress.  Morbidly obese EYES: Pupils equal, round, reactive to light and accommodation. No scleral icterus. Extraocular muscles intact.  HEENT: Head atraumatic, normocephalic. Oropharynx and nasopharynx clear.  NECK:  Supple, no jugular venous distention. No thyroid enlargement, no tenderness.  LUNGS: Diminished breath sounds bilaterally, no wheezing, patient has rales,  crepitation. No use of accessory muscles of respiration.   CARDIOVASCULAR: S1, S2 normal. No murmurs, rubs, or gallops.  ABDOMEN: Soft, nontender, nondistended. Bowel sounds present. No organomegaly or mass.  EXTREMITIES: some pedal edema, cyanosis, or clubbing.  NEUROLOGIC: Cranial nerves II through XII are intact. Muscle strength 5/5 in all extremities. Sensation intact. Gait not checked.  PSYCHIATRIC: The patient is alert and oriented x 3.  SKIN: No obvious rash, lesion, or ulcer.    LABORATORY PANEL:   CBC Recent Labs  Lab 12/18/17 0633  WBC 6.3  HGB 14.2  HCT 42.4  PLT 317   ------------------------------------------------------------------------------------------------------------------  Chemistries  Recent Labs  Lab 12/14/17 2228 12/15/17 0500  12/20/17 0636  NA 138  --    < > 137  K 3.8  --    < > 4.0  CL 103  --    < > 100  CO2 26  --    < > 28  GLUCOSE 312*  --    < > 255*  BUN 21*  --    < > 18  CREATININE 1.01  --    < > 0.70  CALCIUM 8.2*  --    < > 8.8*  MG  --  2.0  --   --   AST 46*  --   --   --   ALT 57*  --   --   --   ALKPHOS 59  --   --   --   BILITOT 0.8  --   --   --    < > = values in this interval not displayed.   ------------------------------------------------------------------------------------------------------------------  Cardiac Enzymes Recent Labs  Lab 12/15/17 0500  TROPONINI 0.03*   ------------------------------------------------------------------------------------------------------------------  RADIOLOGY:  No results found.  EKG:   Orders placed or performed during the hospital encounter of 12/14/17  . EKG 12-Lead  . EKG 12-Lead  . ED EKG  . ED EKG  . EKG 12-lead  . EKG 12-Lead  . EKG 12-Lead  . EKG    ASSESSMENT AND PLAN:     1.  Narrow complex tachycardia with a history of  WPW Patient was seen by cardiology and case discussed with Dr. Graciela Husbands Recommending to start the patient on metoprolol 50 mg twice a day  Flecainide never started. Echocardiogram is done  results are pending-preliminary report 20% ejection fraction- cath done, no coronary artery disease, no stent placed but noted to have still volume overload so suggested to continue IV diuresis .   Cardiology is working on transfer tomorrow to [facility] for further EP studies.  2.  Interstitial pulmonary edema-acute systolic congestive heart failure Nonischemic cardiomyopathy Lasix 40 mg IV given and recommending 40 mg of Lasix once daily as renal function is slightly worse Daily weight monitoring intake and output and strict DASH diet  Seems to be noncompliant with the diet dietary consult placed Again started back on Lasix . I had counseled in detail about fluid and salt restriction in his diet.  3.   diabetes mellitus Patient is refusing insulin as reported by the nurse reinforced the importance of compliance Checked A1c- 12.4 Severe cardiomyopathy - keep on Levemir.  4.  Obstructive sleep apnea CPAP nightly Sleep study is pending which is scheduled as an outpatient  5.  Morbid obesity Encouraged diet and exercise   6.  Chest pain acute MI ruled out with negative troponins   All the records are reviewed and case discussed with Care Management/Social Workerr. Management plans discussed with the patient, family and they are in agreement.  CODE STATUS: fc  TOTAL TIME TAKING CARE OF THIS PATIENT: 35 minutes.   POSSIBLE D/C IN 1 DAYS, DEPENDING ON CLINICAL CONDITION.  Note: This dictation was prepared with Dragon dictation along with smaller phrase technology. Any transcriptional errors that result from this process are unintentional.   Altamese Dilling M.D on 12/20/2017 at 3:44 PM  Between 7am to 6pm - Pager - [phone] After 6pm go to [email or web address] - password [email or web address] [facility]  [facility]  Office  [phone]  CC: Primary care physician; Eustaquio Boyden, MD

## 2017-12-20 NOTE — Progress Notes (Signed)
Progress Note  Patient Name: Alvin Phillips Date of Encounter: 12/20/2017  Primary Cardiologist: Yvonne Kendall, MD  Subjective   Feels well.  No chest pain, shortness of breath, or palpitations.  Able to lie flat without difficulty.  Leg edema continues to improve.  Inpatient Medications    Scheduled Meds: . aspirin  81 mg Oral Daily  . furosemide  60 mg Intravenous BID  . insulin aspart  0-20 Units Subcutaneous TID WC  . insulin aspart  0-5 Units Subcutaneous QHS  . insulin detemir  15 Units Subcutaneous Daily  . metoprolol succinate  50 mg Oral Daily  . protein supplement shake  11 oz Oral BID BM  . sacubitril-valsartan  1 tablet Oral BID  . sodium chloride flush  3 mL Intravenous Q12H  . spironolactone  25 mg Oral Daily   Continuous Infusions: . sodium chloride     PRN Meds: sodium chloride, acetaminophen, bisacodyl, morphine injection, nitroGLYCERIN, ondansetron (ZOFRAN) IV, senna-docusate, sodium chloride flush   Vital Signs    Vitals:   12/19/17 1614 12/19/17 1945 12/20/17 0444 12/20/17 0500  BP: 127/90 112/85 (!) 91/56   Pulse: 90 88 80   Resp: 18 17 18    Temp: 97.7 F (36.5 C) 97.6 F (36.4 C) (!) 97.5 F (36.4 C)   TempSrc:  Oral Oral   SpO2: 98% 95% 95%   Weight:    (!) 161.6 kg  Height:        Intake/Output Summary (Last 24 hours) at 12/20/2017 0740 Last data filed at 12/19/2017 2246 Gross per 24 hour  Intake -  Output 4600 ml  Net -4600 ml   Filed Weights   12/16/17 0500 12/18/17 0515 12/20/17 0500  Weight: (!) 166.7 kg (!) 163.4 kg (!) 161.6 kg    Telemetry    Normal sinus rhythm- Personally Reviewed  ECG    No new tracing  Physical Exam   GEN: No acute distress.   Neck: No gross JVD, though difficult to assess due to body habitus. Cardiac: RRR, no murmurs, rubs, or gallops.  Respiratory: Clear to auscultation bilaterally. GI: Soft, nontender, non-distended  MS:  Trace pretibial edema bilaterally; No deformity. Neuro:   Nonfocal  Psych: Normal affect   Labs    Chemistry Recent Labs  Lab 12/14/17 2228  12/18/17 0633 12/19/17 0419 12/20/17 0636  NA 138   < > 139 138 137  K 3.8   < > 4.2 4.7 4.0  CL 103   < > 101 102 100  CO2 26   < > 28 26 28   GLUCOSE 312*   < > 268* 242* 255*  BUN 21*   < > 19 19 18   CREATININE 1.01   < > 0.85 0.80 0.70  CALCIUM 8.2*   < > 8.7* 9.0 8.8*  PROT 6.7  --   --   --   --   ALBUMIN 3.5  --   --   --   --   AST 46*  --   --   --   --   ALT 57*  --   --   --   --   ALKPHOS 59  --   --   --   --   BILITOT 0.8  --   --   --   --   GFRNONAA >60   < > >60 >60 >60  GFRAA >60   < > >60 >60 >60  ANIONGAP 9   < > 10  10 9   < > = values in this interval not displayed.     Hematology Recent Labs  Lab 12/14/17 2228 12/16/17 0542 12/18/17 0633  WBC 8.1 7.4 6.3  RBC 4.75 4.57 4.70  HGB 14.2 13.5 14.2  HCT 42.4 41.9 42.4  MCV 89.3 91.6 90.4  MCH 30.0 29.5 30.3  MCHC 33.6 32.2 33.6  RDW 13.8 13.9 13.7  PLT 349 248 317    Cardiac Enzymes Recent Labs  Lab 12/14/17 2228 12/15/17 0254 12/15/17 0500  TROPONINI 0.03* 0.03* 0.03*   No results for input(s): TROPIPOC in the last 168 hours.   BNP Recent Labs  Lab 12/14/17 2228  BNP 234.0*     DDimer No results for input(s): DDIMER in the last 168 hours.   Radiology    No results found.  Cardiac Studies   2D Echocardiogram 9.27.2019  Echocardiogram done on September 27:  - Left ventricle: The cavity size was severely dilated. There was mild concentric hypertrophy. Systolic function was severely reduced. The estimated ejection fraction was in the range of 20% to 25%. Diffuse hypokinesis. Regional wall motion abnormalities cannot be excluded. - Mitral valve: There was mild regurgitation. - Left atrium: The atrium was mildly dilated. - Right ventricle: Poorly visualized. Systolic function was mildly reduced. - Pulmonary arteries: Systolic pressure was mildly elevated. PA peak pressure:  36 mm Hg (S). _____________  Right and left heart catheterization 9.30.2019 1. Normal coronary arteries. 2. Severely reduced LV systolic function by echo. Left ventricular angiography was not performed. 3. Right heart catheterization showed severely elevated filling pressures with pulmonary capillary wedge pressure of 33 mmHg, moderate to severe pulmonary hypertension and moderately reduced cardiac output at 4.8 with a cardiac index of 1.79.  Patient Profile     37 y.o. male with acute on chronic systolic heart failure secondary to nonischemic cardiomyopathy, Wolff-Parkinson-White syndrome with paroxysmal SVT, hypertension, uncontrolled diabetes mellitus, morbid obesity, GERD, obstructive sleep apnea, admitted with heart failure and SVT.  Assessment & Plan    Acute on chronic systolic heart failure due to nonischemic cardiomyopathy Patient continues to improve.  He is minimally symptomatic at this time.  However, I think he would benefit from continued diuresis given severely elevated left and right heart filling pressures on catheterization 2 days ago.  Renal function also remains stable suggesting that he has not been dried out completely.  Diuresis yesterday was very brisk with 4.6 L of urine output.  Decrease furosemide to 60 mg IV twice daily.  Continue current doses of metoprolol, Entresto, and spironolactone.  Discontinue aspirin.  No role in the setting of nonischemic cardiomyopathy without CAD.  WPW No arrhythmias overnight.  Continue metoprolol succinate 50 mg daily.  Case discussed with Dr. Ladona Ridgel.  Next available time for EP study and ablation is Friday.  Patient is agreeable to transfer to [facility] for this, given that he will need at least a few more days of inpatient diuresis.  I will make arrangements for him to be transferred to [facility] tomorrow (Thursday).  Hypertension Blood pressure low normal without symptoms.  Continue carvedilol, Entresto, and  spironolactone.  Type 2 diabetes mellitus Uncontrolled on admission.  Insulin therapy per internal medicine.  Consider adding empagliflozin at discharge.  For questions or updates, please contact [facility] Please consult [email or web address] for contact info under [facility]iology.     Signed, Yvonne Kendall, MD  12/20/2017, 7:40 AM

## 2017-12-20 NOTE — Progress Notes (Signed)
Inpatient Diabetes Program Recommendations  AACE/ADA: New Consensus Statement on Inpatient Glycemic Control (2019)  Target Ranges:  Prepandial:   less than 140 mg/dL      Peak postprandial:   less than 180 mg/dL (1-2 hours)      Critically ill patients:  140 - 180 mg/dL  Results for Alvin Phillips, Alvin Phillips (MRN [id number]) as of 12/20/2017 08:17  Ref. Range 12/20/2017 06:36  Glucose Latest Ref Range: 70 - 99 mg/dL 174 (H)  Novolog 7 units   Results for Alvin Phillips, Alvin Phillips (MRN [id number]) as of 12/20/2017 08:17  Ref. Range 12/18/2017 08:03 12/18/2017 11:31 12/18/2017 15:50 12/18/2017 20:15 12/19/2017 08:36 12/19/17 10:55 12/19/2017 12:25 12/19/2017 16:41 12/19/2017 21:00  Glucose-Capillary Latest Ref Range: 70 - 99 mg/dL 638 (H)  Novolog 7 units 187 (H) 238 (H)  Novolog 7 units  Lantus 10 units 228 (H)  Novolog 2 units 234 (H)  Novolog 7 units   Lantus 10 units 251 (H)  Novolog 11 units 212 (H)  Novolog 7 units 300 (H)  Novolog 3 units   Results for Alvin Phillips, Alvin Phillips (MRN [id number]) as of 12/20/2017 08:17  Ref. Range 10/30/2017 16:01 12/16/2017 05:42  Hemoglobin A1C Latest Ref Range: 4.8 - 5.6 % 12.3 (A) 12.4 (H)   Review of Glycemic Control  Diabetes history: DM2 Outpatient Diabetes medications: Metformin XR 750 mg daily Current orders for Inpatient glycemic control: Levemir 15 units daily, Novolog 0-20 TID with meals, Novolog 0-5 units QHS  Inpatient Diabetes Program Recommendations:  Insulin - Basal: Noted Levemir increased to 15 units daily to start today. Insulin - Meal Coverage: Please consider ordering Novolog 5 units TID with meals for meal coverage if patient eats at least 50% of meals.  HgbA1C: A1C 12.4% on 12/16/17 indicating an average glucose of 309 mg/dl. Patient will need additional DM medications as an outpatient for DM control. Noted patient is refusing to be discharged on insulin. May want to consider discharging on Metformin XR 750 mg daily and Jardiance 10 mg daily (Dr. Okey Dupre noted  to consider adding Jardiance a discharge per note on 12/20/17).  Recommend patient begin checking glucose at home and follow up with PCP within 1 week from discharge.  Thanks, Orlando Penner, RN, MSN, CDE Diabetes Coordinator Inpatient Diabetes Program [phone] (Team Pager from 8am to 5pm)

## 2017-12-20 NOTE — Care Management (Addendum)
Entresto coupon given for 30 day free and co-pay voucher.  Patient verbally confirms he has prescription coverage.

## 2017-12-21 ENCOUNTER — Ambulatory Visit: Payer: 59 | Admitting: Family

## 2017-12-21 ENCOUNTER — Encounter: Payer: Self-pay | Admitting: Nurse Practitioner

## 2017-12-21 ENCOUNTER — Inpatient Hospital Stay (HOSPITAL_COMMUNITY)
Admission: AD | Admit: 2017-12-21 | Discharge: 2017-12-25 | DRG: 273 | Disposition: A | Discharge status: Home / Self Care | Payer: 59 | Source: Other Acute Inpatient Hospital | Attending: Internal Medicine | Admitting: Internal Medicine

## 2017-12-21 DIAGNOSIS — I428 Other cardiomyopathies: Secondary | ICD-10-CM | POA: Diagnosis not present

## 2017-12-21 DIAGNOSIS — I471 Supraventricular tachycardia: Secondary | ICD-10-CM | POA: Diagnosis not present

## 2017-12-21 DIAGNOSIS — R609 Edema, unspecified: Secondary | ICD-10-CM | POA: Diagnosis not present

## 2017-12-21 DIAGNOSIS — I472 Ventricular tachycardia: Secondary | ICD-10-CM | POA: Diagnosis not present

## 2017-12-21 DIAGNOSIS — F17210 Nicotine dependence, cigarettes, uncomplicated: Secondary | ICD-10-CM | POA: Diagnosis present

## 2017-12-21 DIAGNOSIS — Z794 Long term (current) use of insulin: Secondary | ICD-10-CM

## 2017-12-21 DIAGNOSIS — I5023 Acute on chronic systolic (congestive) heart failure: Secondary | ICD-10-CM | POA: Diagnosis not present

## 2017-12-21 DIAGNOSIS — I5022 Chronic systolic (congestive) heart failure: Secondary | ICD-10-CM | POA: Diagnosis present

## 2017-12-21 DIAGNOSIS — Z79899 Other long term (current) drug therapy: Secondary | ICD-10-CM | POA: Diagnosis not present

## 2017-12-21 DIAGNOSIS — I456 Pre-excitation syndrome: Secondary | ICD-10-CM | POA: Diagnosis not present

## 2017-12-21 DIAGNOSIS — I5021 Acute systolic (congestive) heart failure: Secondary | ICD-10-CM | POA: Diagnosis not present

## 2017-12-21 DIAGNOSIS — F1210 Cannabis abuse, uncomplicated: Secondary | ICD-10-CM | POA: Diagnosis present

## 2017-12-21 DIAGNOSIS — I110 Hypertensive heart disease with heart failure: Secondary | ICD-10-CM | POA: Diagnosis present

## 2017-12-21 DIAGNOSIS — K219 Gastro-esophageal reflux disease without esophagitis: Secondary | ICD-10-CM | POA: Diagnosis present

## 2017-12-21 DIAGNOSIS — Z9119 Patient's noncompliance with other medical treatment and regimen: Secondary | ICD-10-CM | POA: Diagnosis not present

## 2017-12-21 DIAGNOSIS — E119 Type 2 diabetes mellitus without complications: Secondary | ICD-10-CM | POA: Diagnosis present

## 2017-12-21 DIAGNOSIS — Z7982 Long term (current) use of aspirin: Secondary | ICD-10-CM | POA: Diagnosis not present

## 2017-12-21 DIAGNOSIS — R0602 Shortness of breath: Secondary | ICD-10-CM | POA: Diagnosis not present

## 2017-12-21 DIAGNOSIS — G4733 Obstructive sleep apnea (adult) (pediatric): Secondary | ICD-10-CM | POA: Diagnosis not present

## 2017-12-21 DIAGNOSIS — Z8249 Family history of ischemic heart disease and other diseases of the circulatory system: Secondary | ICD-10-CM | POA: Diagnosis not present

## 2017-12-21 DIAGNOSIS — I5020 Unspecified systolic (congestive) heart failure: Secondary | ICD-10-CM | POA: Diagnosis present

## 2017-12-21 DIAGNOSIS — R079 Chest pain, unspecified: Secondary | ICD-10-CM | POA: Diagnosis not present

## 2017-12-21 DIAGNOSIS — Z6841 Body Mass Index (BMI) 40.0 and over, adult: Secondary | ICD-10-CM

## 2017-12-21 HISTORY — DX: Cannabis abuse, uncomplicated: F12.10

## 2017-12-21 HISTORY — DX: Other cardiomyopathies: I42.8

## 2017-12-21 HISTORY — DX: Tobacco use: Z72.0

## 2017-12-21 HISTORY — DX: Unspecified systolic (congestive) heart failure: I50.20

## 2017-12-21 LAB — CBC
HCT: 46.9 % (ref 39.0–52.0)
Hemoglobin: 14.7 g/dL (ref 13.0–17.0)
MCH: 28.8 pg (ref 26.0–34.0)
MCHC: 31.3 g/dL (ref 30.0–36.0)
MCV: 92 fL (ref 78.0–100.0)
PLATELETS: 364 10*3/uL (ref 150–400)
RBC: 5.1 MIL/uL (ref 4.22–5.81)
RDW: 12.7 % (ref 11.5–15.5)
WBC: 7.1 10*3/uL (ref 4.0–10.5)

## 2017-12-21 LAB — CREATININE, SERUM: Creatinine, Ser: 1.06 mg/dL (ref 0.61–1.24)

## 2017-12-21 LAB — GLUCOSE, CAPILLARY
GLUCOSE-CAPILLARY: 261 mg/dL — AB (ref 70–99)
GLUCOSE-CAPILLARY: 273 mg/dL — AB (ref 70–99)
GLUCOSE-CAPILLARY: 239 mg/dL — AB (ref 70–99)
Glucose-Capillary: 222 mg/dL — ABNORMAL HIGH (ref 70–99)

## 2017-12-21 LAB — BASIC METABOLIC PANEL
Anion gap: 10 (ref 5–15)
BUN: 18 mg/dL (ref 6–20)
CALCIUM: 8.9 mg/dL (ref 8.9–10.3)
CO2: 30 mmol/L (ref 22–32)
CREATININE: 0.84 mg/dL (ref 0.61–1.24)
Chloride: 97 mmol/L — ABNORMAL LOW (ref 98–111)
GFR calc Af Amer: >60 mL/min (ref >60)
Glucose, Bld: 298 mg/dL — ABNORMAL HIGH (ref 70–99)
Potassium: 4.1 mmol/L (ref 3.5–5.1)
SODIUM: 137 mmol/L (ref 135–145)

## 2017-12-21 MED ORDER — SACUBITRIL-VALSARTAN 24-26 MG PO TABS
1.0000 | ORAL_TABLET | Freq: Two times a day (BID) | ORAL | Status: DC
  Administered 2017-12-21 – 2017-12-25 (×8): 1 via ORAL
  Filled 2017-12-21 (×9): qty 1

## 2017-12-21 MED ORDER — SPIRONOLACTONE 25 MG PO TABS
25.0000 mg | ORAL_TABLET | Freq: Every day | ORAL | Status: DC
  Administered 2017-12-22 – 2017-12-25 (×4): 25 mg via ORAL
  Filled 2017-12-21 (×4): qty 1

## 2017-12-21 MED ORDER — INSULIN ASPART 100 UNIT/ML ~~LOC~~ SOLN: 5.0000 [IU] | Freq: Three times a day (TID) | SUBCUTANEOUS | 11 refills | Status: DC

## 2017-12-21 MED ORDER — ONDANSETRON HCL 4 MG/2ML IJ SOLN: 4.0000 mg | Freq: Four times a day (QID) | INTRAMUSCULAR | Status: DC | PRN

## 2017-12-21 MED ORDER — INSULIN DETEMIR 100 UNIT/ML ~~LOC~~ SOLN
25.0000 [IU] | Freq: Every day | SUBCUTANEOUS | Status: DC
  Administered 2017-12-21: 25 [IU] via SUBCUTANEOUS
  Filled 2017-12-21 (×2): qty 0.25

## 2017-12-21 MED ORDER — FUROSEMIDE 10 MG/ML IJ SOLN
40.0000 mg | Freq: Two times a day (BID) | INTRAMUSCULAR | Status: DC
  Administered 2017-12-21 – 2017-12-25 (×8): 40 mg via INTRAVENOUS
  Filled 2017-12-21 (×8): qty 4

## 2017-12-21 MED ORDER — SODIUM CHLORIDE 0.9% FLUSH: 3.0000 mL | INTRAVENOUS | Status: DC | PRN

## 2017-12-21 MED ORDER — SODIUM CHLORIDE 0.9 % IV SOLN: 250.0000 mL | INTRAVENOUS | Status: DC | PRN

## 2017-12-21 MED ORDER — METOPROLOL SUCCINATE ER 50 MG PO TB24: 50.0000 mg | ORAL_TABLET | Freq: Every day | ORAL | 0 refills | Status: DC

## 2017-12-21 MED ORDER — METOPROLOL SUCCINATE ER 50 MG PO TB24
50.0000 mg | ORAL_TABLET | Freq: Every day | ORAL | Status: DC
  Administered 2017-12-22 – 2017-12-23 (×2): 50 mg via ORAL
  Filled 2017-12-21 (×2): qty 1

## 2017-12-21 MED ORDER — SACUBITRIL-VALSARTAN 24-26 MG PO TABS: 1.0000 | ORAL_TABLET | Freq: Two times a day (BID) | ORAL | 0 refills | Status: DC

## 2017-12-21 MED ORDER — SODIUM CHLORIDE 0.9% FLUSH
3.0000 mL | Freq: Two times a day (BID) | INTRAVENOUS | Status: DC
  Administered 2017-12-21 – 2017-12-24 (×5): 3 mL via INTRAVENOUS

## 2017-12-21 MED ORDER — INSULIN DETEMIR 100 UNIT/ML ~~LOC~~ SOLN: 25.0000 [IU] | Freq: Every day | SUBCUTANEOUS | 11 refills | Status: DC

## 2017-12-21 MED ORDER — INSULIN ASPART 100 UNIT/ML ~~LOC~~ SOLN
0.0000 [IU] | Freq: Three times a day (TID) | SUBCUTANEOUS | Status: DC
  Administered 2017-12-22: 5 [IU] via SUBCUTANEOUS
  Administered 2017-12-22: 11 [IU] via SUBCUTANEOUS
  Administered 2017-12-23 (×3): 5 [IU] via SUBCUTANEOUS
  Administered 2017-12-24: 8 [IU] via SUBCUTANEOUS
  Administered 2017-12-24 (×2): 5 [IU] via SUBCUTANEOUS

## 2017-12-21 MED ORDER — FUROSEMIDE 10 MG/ML IJ SOLN: 40.0000 mg | Freq: Two times a day (BID) | INTRAMUSCULAR | Status: DC

## 2017-12-21 MED ORDER — HEPARIN SODIUM (PORCINE) 5000 UNIT/ML IJ SOLN
5000.0000 [IU] | Freq: Three times a day (TID) | INTRAMUSCULAR | Status: DC
  Administered 2017-12-21 – 2017-12-23 (×4): 5000 [IU] via SUBCUTANEOUS
  Filled 2017-12-21 (×5): qty 1

## 2017-12-21 MED ORDER — INSULIN ASPART 100 UNIT/ML ~~LOC~~ SOLN
5.0000 [IU] | Freq: Three times a day (TID) | SUBCUTANEOUS | Status: DC
  Administered 2017-12-21: 5 [IU] via SUBCUTANEOUS
  Filled 2017-12-21: qty 1

## 2017-12-21 MED ORDER — INSULIN DETEMIR 100 UNIT/ML ~~LOC~~ SOLN
25.0000 [IU] | Freq: Every day | SUBCUTANEOUS | Status: DC
  Administered 2017-12-22 – 2017-12-24 (×3): 25 [IU] via SUBCUTANEOUS
  Filled 2017-12-21 (×4): qty 0.25

## 2017-12-21 MED ORDER — ACETAMINOPHEN 325 MG PO TABS: 650.0000 mg | ORAL_TABLET | ORAL | Status: DC | PRN

## 2017-12-21 MED ORDER — ASPIRIN EC 81 MG PO TBEC
81.0000 mg | DELAYED_RELEASE_TABLET | Freq: Every day | ORAL | Status: DC
  Administered 2017-12-22 – 2017-12-25 (×4): 81 mg via ORAL
  Filled 2017-12-21 (×4): qty 1

## 2017-12-21 MED ORDER — SPIRONOLACTONE 25 MG PO TABS: 25.0000 mg | ORAL_TABLET | Freq: Every day | ORAL | 0 refills | Status: DC

## 2017-12-21 NOTE — Progress Notes (Addendum)
Progress Note  Patient Name: Alvin Phillips Date of Encounter: 12/21/2017  Primary Cardiologist: Yvonne Kendall, MD  Subjective   Breathing continues to improve.  No orthopnea.  Still responding well to lasix.  For tx to [facility] today for EP eval.  Inpatient Medications    Scheduled Meds: . aspirin  81 mg Oral Daily  . furosemide  60 mg Intravenous BID  . insulin aspart  0-20 Units Subcutaneous TID WC  . insulin aspart  0-5 Units Subcutaneous QHS  . insulin aspart  5 Units Subcutaneous TID WC  . insulin detemir  25 Units Subcutaneous Daily  . metoprolol succinate  50 mg Oral Daily  . protein supplement shake  11 oz Oral BID BM  . sacubitril-valsartan  1 tablet Oral BID  . sodium chloride flush  3 mL Intravenous Q12H  . spironolactone  25 mg Oral Daily   Continuous Infusions: . sodium chloride     PRN Meds: sodium chloride, acetaminophen, bisacodyl, morphine injection, nitroGLYCERIN, ondansetron (ZOFRAN) IV, senna-docusate, sodium chloride flush   Vital Signs    Vitals:   12/20/17 2022 12/21/17 0334 12/21/17 0747 12/21/17 1029  BP: 120/89 106/80 121/86 124/80  Pulse: 92 85 82 85  Resp: 18 18 19    Temp: 98 F (36.7 C) 98 F (36.7 C) (!) 96.4 F (35.8 C)   TempSrc: Oral Oral Axillary   SpO2: 92% 96% 98%   Weight:  (!) 160.3 kg    Height:        Intake/Output Summary (Last 24 hours) at 12/21/2017 1244 Last data filed at 12/21/2017 1045 Gross per 24 hour  Intake 240 ml  Output 3950 ml  Net -3710 ml   Filed Weights   12/18/17 0515 12/20/17 0500 12/21/17 0334  Weight: (!) 163.4 kg (!) 161.6 kg (!) 160.3 kg    Physical Exam   GEN: Well nourished, well developed, in no acute distress.  HEENT: Grossly normal.  Neck: Supple, difficult to gauge JVP 2/2 girth, no carotid bruits, or masses. Cardiac: RRR, no murmurs, rubs, or gallops. No clubbing, cyanosis, edema.  Radials/DP/PT 2+ and equal bilaterally.  Respiratory:  Respirations regular and unlabored, clear to  auscultation bilaterally. GI: Soft, nontender, nondistended, BS + x 4. MS: no deformity or atrophy. Skin: warm and dry, no rash. Neuro:  Strength and sensation are intact. Psych: AAOx3.  Normal affect.  Labs    Chemistry Recent Labs  Lab 12/14/17 2228  12/19/17 0419 12/20/17 0636 12/21/17 1127  NA 138   < > 138 137 137  K 3.8   < > 4.7 4.0 4.1  CL 103   < > 102 100 97*  CO2 26   < > 26 28 30   GLUCOSE 312*   < > 242* 255* 298*  BUN 21*   < > 19 18 18   CREATININE 1.01   < > 0.80 0.70 0.84  CALCIUM 8.2*   < > 9.0 8.8* 8.9  PROT 6.7  --   --   --   --   ALBUMIN 3.5  --   --   --   --   AST 46*  --   --   --   --   ALT 57*  --   --   --   --   ALKPHOS 59  --   --   --   --   BILITOT 0.8  --   --   --   --   GFRNONAA >60   < > >  60 >60 >60  GFRAA >60   < > >60 >60 >60  ANIONGAP 9   < > 10 9 10    < > = values in this interval not displayed.     Hematology Recent Labs  Lab 12/14/17 2228 12/16/17 0542 12/18/17 0633  WBC 8.1 7.4 6.3  RBC 4.75 4.57 4.70  HGB 14.2 13.5 14.2  HCT 42.4 41.9 42.4  MCV 89.3 91.6 90.4  MCH 30.0 29.5 30.3  MCHC 33.6 32.2 33.6  RDW 13.8 13.9 13.7  PLT 349 248 317    Cardiac Enzymes Recent Labs  Lab 12/14/17 2228 12/15/17 0254 12/15/17 0500  TROPONINI 0.03* 0.03* 0.03*     BNP Recent Labs  Lab 12/14/17 2228  BNP 234.0*      Radiology    No results found.  Telemetry    RSR - Personally Reviewed  Cardiac Studies   2D Echocardiogram9.27.2019  Echocardiogram done on September 27:  - Left ventricle: The cavity size was severely dilated. There was mild concentric hypertrophy. Systolic function was severely reduced. The estimated ejection fraction was in the range of 20% to 25%. Diffuse hypokinesis. Regional wall motion abnormalities cannot be excluded. - Mitral valve: There was mild regurgitation. - Left atrium: The atrium was mildly dilated. - Right ventricle: Poorly visualized. Systolic function was  mildly reduced. - Pulmonary arteries: Systolic pressure was mildly elevated. PA peak pressure: 36 mm Hg (S). _____________  Right and left heart catheterization 9.30.2019 1. Normal coronary arteries. 2. Severely reduced LV systolic function by echo. Left ventricular angiography was not performed. 3. Right heart catheterization showed severely elevated filling pressures with pulmonary capillary wedge pressure of 33 mmHg, moderate to severe pulmonary hypertension and moderately reduced cardiac output at 4.8 with a cardiac index of 1.79.  Patient Profile     37 y.o. male with acute on chronic systolic heart failure secondary to nonischemic cardiomyopathy, Wolff-Parkinson-White syndrome with paroxysmal SVT, hypertension, uncontrolled diabetes mellitus, morbid obesity, GERD, obstructive sleep apnea, admitted with heart failure and SVT.  Assessment & Plan    1.  Acute on chronic systolic CHF/NICM:  Admitted with a several wk h/o progressive dyspnea and in the setting of a 1-2 hr h/o SVT/WPW.  EF 20-25%.  Cath 9/30, revealed nl cors w/ elevated filling pressures (PCWP of 33) and mod reduced CO/CI (4.8/1.79). Lasix dose reduced sl yesterday.  Minus 3L overnight and 10.7L for admission.  Wt down from 167.7 kg on admission to 160.3kg currently.  BUN/Creat stable.  Bicarb slowly rising - 30 this AM.  I will reduce lasix further to 40 IV bid.  I suspect we'll be changing to PO within the next 24-48 hrs.  Cont  blocker, entresto, and spiro.  BP variable.  Would not titrate entresto @ this time.  2.  SVT/WPW:  Presented w/ SVT @ 190.  No recurrent SVT.  Cont  blocker.  For tx to [facility] today for EP eval.  3.  DM II:  Uncontrolled  A1c 12.4.  Insulin per IM.  4.  Essential HTN:  Poorly controlled previously - likely driving force behind NICM.  Currently controlled on  blocker, ARNI, MRA.  5.  Morbid obesity: will benefit from cardiopulmonary rehab following d/c.  6. Tob/Marijuana abuse:  Smokes  pot daily.  Smokes less than a pack of cigarettes/wk.  We discussed need for complete cessation and he seems willing to quit.  Signed, Nicolasa Ducking, NP  12/21/2017, 12:44 PM    For questions or updates, please  contact   Please consult [email or web address] for contact info under Cardiology/STEMI.

## 2017-12-21 NOTE — Care Management (Signed)
Waiting on bed at [facility] for further EP studies and possible ablation.

## 2017-12-21 NOTE — Discharge Summary (Signed)
[facility] [place] [facility]   PATIENT NAME: Alvin Phillips    MR#:  [id number]  DATE OF BIRTH:  12/04/1980  DATE OF ADMISSION:  12/14/2017 ADMITTING PHYSICIAN: Barbaraann Rondo, MD  DATE OF DISCHARGE: 12/21/2017   PRIMARY CARE PHYSICIAN: Eustaquio Boyden, MD    ADMISSION DIAGNOSIS:  Wolff-Parkinson-White syndrome [I45.6] Tachyarrhythmia [R00.0]  DISCHARGE DIAGNOSIS:  Active Problems:   Chest pain   Acute on chronic systolic heart failure (HCC)   SECONDARY DIAGNOSIS:   Past Medical History:  Diagnosis Date  . Diabetes mellitus without complication (HCC) 07/10/13   referred to DSME, did not show (04/2014)  . GERD (gastroesophageal reflux disease)   . HFrEF (heart failure with reduced ejection fraction) (HCC)    a. 11/2017 Echo: EF 20-25%, diff HK.  Marland Kitchen Hypertension   . Marijuana abuse   . Morbidly obese (HCC) 03/24/2014  . NICM (nonischemic cardiomyopathy) (HCC)    a. 11/2017 Echo: EF 20-25%, diff HK, mild MR, mildy dil LA, PASP .  . OSA (obstructive sleep apnea) 10/29/2013  . Tobacco abuse   . Wolff-Parkinson-White (WPW) syndrome    a. 11/2017 Admitted w/ SVT @ 190 bpm.    HOSPITAL COURSE:   1.  Narrow complex tachycardia with a history of  WPW Patient was seen by cardiology and case discussed with Dr. Graciela Husbands Recommending to start the patient on metoprolol 50 mg twice a day  Flecainide never started. Echocardiogram is done results are pending-preliminary report 20% ejection fraction- cath done, no coronary artery disease, no stent placed but noted to have still volume overload so suggested to continue IV diuresis .   Transfer to [facility] for further EP studies.  2.  Interstitial pulmonary edema-acute systolic congestive heart failure Nonischemic cardiomyopathy Lasix 40 mg IV given and recommending 40 mg of Lasix once daily as renal function is slightly worse Daily weight monitoring intake and output and strict DASH diet  Seems to be  noncompliant with the diet dietary consult placed Again started back on Lasix . I had counseled in detail about fluid and salt restriction in his diet.  3.   diabetes mellitus Patient is refusing insulin as reported by the nurse reinforced the importance of compliance Checked A1c- 12.4 Severe cardiomyopathy - keep on Levemir.  4.  Obstructive sleep apnea CPAP nightly Sleep study is pending which is scheduled as an outpatient  5.  Morbid obesity Encouraged diet and exercise   6.  Chest pain acute MI ruled out with negative troponins   DISCHARGE CONDITIONS:   Stable.  CONSULTS OBTAINED:  Treatment Team:  Barbaraann Rondo, MD Antonieta Iba, MD  DRUG ALLERGIES:  No Known Allergies  DISCHARGE MEDICATIONS:   Allergies as of 12/21/2017   No Known Allergies     Medication List    STOP taking these medications   lisinopril 10 MG tablet Commonly known as:  PRINIVIL,ZESTRIL   metFORMIN 750 MG 24 hr tablet Commonly known as:  GLUCOPHAGE-XR     TAKE these medications   aspirin EC 81 MG tablet Take 1 tablet (81 mg total) by mouth daily.   furosemide 20 MG tablet Commonly known as:  LASIX Take 1 tablet (20 mg total) by mouth daily as needed for fluid or edema.   glucose blood test strip Use as instructed   ibuprofen 200 MG tablet Commonly known as:  ADVIL,MOTRIN Take 200 mg by mouth as needed.   insulin aspart 100 UNIT/ML injection Commonly known as:  novoLOG Inject 5  Units into the skin 3 (three) times daily with meals.   insulin detemir 100 UNIT/ML injection Commonly known as:  LEVEMIR Inject 0.25 mLs (25 Units total) into the skin daily. Start taking on:  12/22/2017   metoprolol succinate 50 MG 24 hr tablet Commonly known as:  TOPROL-XL Take 1 tablet (50 mg total) by mouth daily. Take with or immediately following a meal. Start taking on:  12/22/2017   onetouch ultrasoft lancets Use as instructed   pantoprazole 20 MG tablet Commonly known  as:  PROTONIX Take 1 tablet (20 mg total) by mouth daily. What changed:  additional instructions   sacubitril-valsartan 24-26 MG Commonly known as:  ENTRESTO Take 1 tablet by mouth 2 (two) times daily.   spironolactone 25 MG tablet Commonly known as:  ALDACTONE Take 1 tablet (25 mg total) by mouth daily. Start taking on:  12/22/2017        DISCHARGE INSTRUCTIONS:    Follow at [facility] cardiology.  If you experience worsening of your admission symptoms, develop shortness of breath, life threatening emergency, suicidal or homicidal thoughts you must seek medical attention immediately by calling 911 or calling your MD immediately  if symptoms less severe.  You Must read complete instructions/literature along with all the possible adverse reactions/side effects for all the Medicines you take and that have been prescribed to you. Take any new Medicines after you have completely understood and accept all the possible adverse reactions/side effects.   Please note  You were cared for by a hospitalist during your hospital stay. If you have any questions about your discharge medications or the care you received while you were in the hospital after you are discharged, you can call the unit and asked to speak with the hospitalist on call if the hospitalist that took care of you is not available. Once you are discharged, your primary care physician will handle any further medical issues. Please note that NO REFILLS for any discharge medications will be authorized once you are discharged, as it is imperative that you return to your primary care physician (or establish a relationship with a primary care physician if you do not have one) for your aftercare needs so that they can reassess your need for medications and monitor your lab values.    Today   CHIEF COMPLAINT:   Chief Complaint  Patient presents with  . Shortness of Breath  . Chest Pain    HISTORY OF PRESENT ILLNESS:  Alvin Phillips  is a  37 y.o. male with a known history of WPW who p/w SOB, tachycardia/palpitations, chest pain/pressure. Pt states he felt well throughout the day. In fact, he says that last night he managed to get the best nights sleep he has had in a long time. When he woke up in the morning, he felt his day was off to a great start. He went to work, had an uneventful day,  and left work @~1700PM. He went to his nephew's football game in [place]. He states after the game he walked up a steep hill and became very fatigued/tired. He endorses continued fatigue/malaise throughout the evening, progressively worsening. He states he was hungry after the game, and ate. He developed diaphoresis and palpitations shortly thereafter. He had to do some shopping, and went to [place]. He states he had difficulty moving things in and out of the cart due to fatigue/malaise. He sat down at the blood pressure check station, and found BP to be 140s/100s, HR 190s. He rechecked HR after  some time, decreased to 150s. Pt went home, and started taking items in, but continued to have difficulty due to fatigue. He eventually became so severely exhausted that he felt unable to engage in any physical activity. He endorses midchest pressure w/o frank pain. He drove himself to the ED. HR in ED was in the 190s on arrival, narrow-complex. Pt w/ Hx WPW, saw Dr. Okey Dupre on 09/25. He needs an EP study.   VITAL SIGNS:  Blood pressure 124/80, pulse 85, temperature (!) 96.4 F (35.8 C), temperature source Axillary, resp. rate 19, height 5\' 10"  (1.778 m), weight (!) 160.3 kg, SpO2 98 %.  I/O:    Intake/Output Summary (Last 24 hours) at 12/21/2017 1522 Last data filed at 12/21/2017 1355 Gross per 24 hour  Intake 480 ml  Output 3950 ml  Net -3470 ml    PHYSICAL EXAMINATION:   GENERAL:  37 y.o.-year-old obese patient sitting in the bed with no acute distress.  Morbidly obese EYES: Pupils equal, round, reactive to light and accommodation. No scleral icterus.  Extraocular muscles intact.  HEENT: Head atraumatic, normocephalic. Oropharynx and nasopharynx clear.  NECK:  Supple, no jugular venous distention. No thyroid enlargement, no tenderness.  LUNGS: Diminished breath sounds bilaterally, no wheezing, patient has rales,  crepitation. No use of accessory muscles of respiration.  CARDIOVASCULAR: S1, S2 normal. No murmurs, rubs, or gallops.  ABDOMEN: Soft, nontender, nondistended. Bowel sounds present. No organomegaly or mass.  EXTREMITIES: some pedal edema, cyanosis, or clubbing.  NEUROLOGIC: Cranial nerves II through XII are intact. Muscle strength 5/5 in all extremities. Sensation intact. Gait not checked.  PSYCHIATRIC: The patient is alert and oriented x 3.  SKIN: No obvious rash, lesion, or ulcer.    DATA REVIEW:   CBC Recent Labs  Lab 12/18/17 0633  WBC 6.3  HGB 14.2  HCT 42.4  PLT 317    Chemistries  Recent Labs  Lab 12/14/17 2228 12/15/17 0500  12/21/17 1127  NA 138  --    < > 137  K 3.8  --    < > 4.1  CL 103  --    < > 97*  CO2 26  --    < > 30  GLUCOSE 312*  --    < > 298*  BUN 21*  --    < > 18  CREATININE 1.01  --    < > 0.84  CALCIUM 8.2*  --    < > 8.9  MG  --  2.0  --   --   AST 46*  --   --   --   ALT 57*  --   --   --   ALKPHOS 59  --   --   --   BILITOT 0.8  --   --   --    < > = values in this interval not displayed.    Cardiac Enzymes Recent Labs  Lab 12/15/17 0500  TROPONINI 0.03*    Microbiology Results  Results for orders placed or performed during the hospital encounter of 04/13/10  Rapid strep screen     Status: None   Collection Time: 04/13/10 10:56 AM  Result Value Ref Range Status   Streptococcus, Group A Screen (Direct) NEGATIVE NEGATIVE Final    RADIOLOGY:  No results found.  EKG:   Orders placed or performed during the hospital encounter of 12/14/17  . EKG 12-Lead  . EKG 12-Lead  . ED EKG  . ED EKG  . EKG  12-lead  . EKG 12-Lead  . EKG 12-Lead  . EKG      Management  plans discussed with the patient, family and they are in agreement.  CODE STATUS:     Code Status Orders  (From admission, onward)         Start     Ordered   12/15/17 0149  Full code  Continuous     12/15/17 0148        Code Status History    This patient has a current code status but no historical code status.      TOTAL TIME TAKING CARE OF THIS PATIENT: 35 minutes.    Altamese Dilling M.D on 12/21/2017 at 3:22 PM  Between 7am to 6pm - Pager - [phone]  After 6pm go to [email or web address] - password [email or web address]  [facility] [place] [facility]  Office  [phone]  CC: Primary care physician; Eustaquio Boyden, MD   Note: This dictation was prepared with Dragon dictation along with smaller phrase technology. Any transcriptional errors that result from this process are unintentional.

## 2017-12-21 NOTE — H&P (Addendum)
History & Physical    Patient ID: Alvin Phillips MRN: [id number], DOB/AGE: Dec 10, 1980   Admit date: 12/21/2017   Primary Physician: Eustaquio Boyden, MD Primary Cardiologist: Yvonne Kendall, MD  Patient Profile    37 year old male with a history of Wolff-Parkinson-White syndrome, palpitations, hypertension, uncontrolled diabetes mellitus, morbid obesity, GERD, and sleep apnea with CPAP noncompliance, who was admitted to [facility] regional on September 26 with supraventricular tachycardia and newly diagnosed heart failure.  Past Medical History    Past Medical History:  Diagnosis Date  . Diabetes mellitus without complication (HCC) 07/10/13   referred to DSME, did not show (04/2014)  . GERD (gastroesophageal reflux disease)   . HFrEF (heart failure with reduced ejection fraction) (HCC)    a. 11/2017 Echo: EF 20-25%, diff HK.  Marland Kitchen Hypertension   . Marijuana abuse   . Morbidly obese (HCC) 03/24/2014  . NICM (nonischemic cardiomyopathy) (HCC)    a. 11/2017 Echo: EF 20-25%, diff HK, mild MR, mildy dil LA, PASP ; b. 11/2017 Cath: nl cors. PCWP . CO/CI 4.8/1.79.  Marland Kitchen OSA (obstructive sleep apnea) 10/29/2013  . Tobacco abuse   . Wolff-Parkinson-White (WPW) syndrome    a. 11/2017 Admitted w/ SVT @ 190 bpm.    Past Surgical History:  Procedure Laterality Date  . FRACTURE SURGERY Left 2002   fractured arm  . HERNIA REPAIR  12-16-13   umbilical  . RIGHT/LEFT HEART CATH AND CORONARY ANGIOGRAPHY N/A 12/18/2017   Procedure: RIGHT/LEFT HEART CATH AND CORONARY ANGIOGRAPHY;  Surgeon: Iran Ouch, MD;  Location: [facility] INVASIVE CV LAB;  Service: Cardiovascular;  Laterality: N/A;     Allergies  No Known Allergies  History of Present Illness    37 year old male with the above past medical history including Wolff-Parkinson-White syndrome, palpitations, hypertension, uncontrolled diabetes mellitus, morbid obesity, GERD, and sleep apnea.  In the setting of prior history of intermittent  palpitations, he had previously been advised by primary care to follow-up with cardiology however he failed to follow-up.  Over approximately 2 to 3-week.  Through early in mid September, he was having more frequent palpitations and also increasing dyspnea, orthopnea, lower extremity edema, and increasing abdominal girth.  On September 26, he had a 2-hour episode of persistent tachycardia and dyspnea associated with fatigue.  He presented to the emergency department and was found to be in SVT.  In the ER, he converted spontaneously.  ECG notable for delta waves consistent with Wolff-Parkinson-White syndrome.  CT angiogram of the chest was negative for pulmonary embolism but did show CHF and interstitial edema.  He was markedly volume overloaded on exam and was placed on intravenous Lasix.  There was initially a plan to place him on flecainide for management of SVT however, echocardiogram showed severe LV dysfunction with an EF of 20 to 25% and diffuse hypokinesis.  Flecainide was discontinued and he underwent diagnostic catheterization which showed normal coronary arteries with elevated filling pressures and a wedge of 33.  Cardiac output/cardiac index 4.8/1.79.  He was placed back on IV Lasix and aggressively diuresed over the subsequent 4 days with significant clinical improvement.  Thus far, he is -10.7 L for admission with a reduction in weight of 7.4 kg.  With this, he has had significant improvement in breathing as well as lower extremity swelling and abdominal girth.  He has not had any recurrence of SVT.  His case has been discussed with our electrophysiology team at [facility] and patient is tentatively scheduled for EP study tomorrow.  He will be transferred today for ongoing diuresis and management.  Current Medications from [facility]    Scheduled Meds: . aspirin  81 mg Oral Daily  . furosemide  60 mg Intravenous BID  . insulin aspart  0-20 Units Subcutaneous TID WC  . insulin aspart  0-5 Units  Subcutaneous QHS  . insulin aspart  5 Units Subcutaneous TID WC  . insulin detemir  25 Units Subcutaneous Daily  . metoprolol succinate  50 mg Oral Daily  . protein supplement shake  11 oz Oral BID BM  . sacubitril-valsartan  1 tablet Oral BID  . sodium chloride flush  3 mL Intravenous Q12H  . spironolactone  25 mg Oral Daily     Family History    Family History  Problem Relation Age of Onset  . Diabetes Maternal Grandmother   . Heart attack Maternal Grandmother 80  . Diabetes Paternal Grandfather    He indicated that his mother is alive. He indicated that his father is alive. He indicated that his sister is alive. He indicated that both of his brothers are alive. He indicated that his maternal grandmother is deceased. He indicated that his paternal grandfather is deceased.   Social History    Social History   Socioeconomic History  . Marital status: Single    Spouse name: Not on file  . Number of children: 2  . Years of education: Not on file  . Highest education level: Not on file  Occupational History  . Occupation: Advertising account executive: SELF-EMPLOYED    Comment: [employer]  Social Needs  . Financial resource strain: Not on file  . Food insecurity:    Worry: Not on file    Inability: Not on file  . Transportation needs:    Medical: Not on file    Non-medical: Not on file  Tobacco Use  . Smoking status: Former Smoker    Packs/day: 0.25    Years: 15.00    Pack years: 3.75    Types: Cigarettes    Last attempt to quit: 2019    Years since quitting: 0.7  . Smokeless tobacco: Never Used  Substance and Sexual Activity  . Alcohol use: Yes    Alcohol/week: 0.0 standard drinks    Comment: Occassional beer, shots of liquor  . Drug use: Yes    Frequency: 2.0 times per week    Types: Marijuana    Comment: 1 time per week; none in 4-6 months.  . Sexual activity: Not on file  Lifestyle  . Physical activity:    Days per week: Not on file     Minutes per session: Not on file  . Stress: Not on file  Relationships  . Social connections:    Talks on phone: Not on file    Gets together: Not on file    Attends religious service: Not on file    Active member of club or organization: Not on file    Attends meetings of clubs or organizations: Not on file    Relationship status: Not on file  . Intimate partner violence:    Fear of current or ex partner: Not on file    Emotionally abused: Not on file    Physically abused: Not on file    Forced sexual activity: Not on file  Other Topics Concern  . Not on file  Social History Narrative   Lives with girlfriend   Hoke grew up in [place]. He is currently living in  [place]. [name], Tith). He breeds dogs Medical sales representative).     Review of Systems    General:  No chills, fever, night sweats or weight changes.  Cardiovascular:  No chest pain, PTA: +++ dyspnea on exertion, +++ edema, +++ orthopnea, +++ palpitations, +++ paroxysmal nocturnal dyspnea. Dermatological: No rash, lesions/masses Respiratory: No cough, +++ dyspnea - improving Urologic: No hematuria, dysuria Abdominal:   No nausea, vomiting, diarrhea, bright red blood per rectum, melena, or hematemesis. +++ inc abd girth. Neurologic:  No visual changes, wkns, changes in mental status. All other systems reviewed and are otherwise negative except as noted above.  Physical Exam    124/80, 85, 18, 96.4 pulse ox 98% on room air. General: Pleasant, NAD Psych: Normal affect. Neuro: Alert and oriented X 3. Moves all extremities spontaneously. HEENT: Normal  Neck: Supple, obese, difficult to gauge JVP.  No bruits.  Lungs:  Resp regular and unlabored, CTA. Heart: RRR no s3, s4, or murmurs. Abdomen: Obese, soft, non-tender, non-distended, BS + x 4.  Extremities: No clubbing, cyanosis or edema. DP/PT/Radials 2+ and equal bilaterally.  Labs    Troponin [facility] Care Test) Lab Results    Component Value Date   TROPONINI 0.03 [facility]) 12/15/2017    Lab Results  Component Value Date   WBC 6.3 12/18/2017   HGB 14.2 12/18/2017   HCT 42.4 12/18/2017   MCV 90.4 12/18/2017   PLT 317 12/18/2017    Recent Labs  Lab 12/14/17 2228  12/21/17 1127  NA 138   < > 137  K 3.8   < > 4.1  CL 103   < > 97*  CO2 26   < > 30  BUN 21*   < > 18  CREATININE 1.01   < > 0.84  CALCIUM 8.2*   < > 8.9  PROT 6.7  --   --   BILITOT 0.8  --   --   ALKPHOS 59  --   --   ALT 57*  --   --   AST 46*  --   --   GLUCOSE 312*   < > 298*   < > = values in this interval not displayed.   Lab Results  Component Value Date   CHOL 163 12/17/2017   HDL 27 (L) 12/17/2017   LDLCALC 93 12/17/2017   TRIG 215 (H) 12/17/2017    Radiology Studies    Dg Chest 2 View  Result Date: 12/12/2017 CLINICAL DATA:  Shortness of breath and chest pain EXAM: CHEST - 2 VIEW COMPARISON:  April 11, 2010 FINDINGS: There Is mild interstitial edema. There are areas of atelectatic change in the left mid lung and right base. There is no consolidation. There is cardiomegaly with pulmonary venous hypertension. No adenopathy. No bone lesions. IMPRESSION: Cardiomegaly with pulmonary vascular congestion. Mild interstitial edema. There may be a degree of underlying congestive heart failure. No consolidation. Areas of mild atelectatic change bilaterally. Electronically Signed   By: Bretta Bang III M.D.   On: 12/12/2017 13:11   Ct Angio Chest Pe W Or Wo Contrast  Result Date: 12/15/2017 CLINICAL DATA:  37 year old male with shortness of breath. EXAM: CT ANGIOGRAPHY CHEST WITH CONTRAST TECHNIQUE: Multidetector CT imaging of the chest was performed using the standard protocol during bolus administration of intravenous contrast. Multiplanar CT image reconstructions and MIPs were obtained to evaluate the vascular anatomy. CONTRAST:  ISOVUE-370 IOPAMIDOL (ISOVUE-370) INJECTION 76% COMPARISON:  Chest radiograph dated 12/14/2017  FINDINGS: Cardiovascular: There is mild cardiomegaly. Retrograde flow of contrast from the right atrium into the IVC consistent with right heart dysfunction. No pericardial effusion. The thoracic aorta is unremarkable as visualized. There is no CT evidence of pulmonary embolism. Mediastinum/Nodes: Mildly enlarged right hilar lymph nodes measure up to 14 mm in short axis. The esophagus and the thyroid gland are grossly unremarkable. No mediastinal fluid collection. Lungs/Pleura: There is interstitial prominence and interlobular septal thickening predominantly involving the lower lobes most consistent with edema. Minimal bibasilar linear atelectatic changes noted. No lobar consolidation. There is no pleural effusion or pneumothorax. The central airways are patent. Upper Abdomen: Enlarged appearance of the liver. Partially visualized 2 cm rounded lesion in the left adrenal gland, indeterminate but corresponds to the lesion seen on the prior CT of 07/09/2013. Musculoskeletal: No chest wall abnormality. No acute or significant osseous findings. Review of the MIP images confirms the above findings. IMPRESSION: 1. No CT evidence of pulmonary embolism. 2. Cardiomegaly with findings of CHF and interstitial edema. 3. Mildly enlarged right hilar lymph nodes of indeterminate etiology, likely reactive. Clinical correlation and attention on follow-up imaging recommended. Electronically Signed   By: Elgie Collard M.D.   On: 12/15/2017 02:39   Dg Chest Portable 1 View  Result Date: 12/14/2017 CLINICAL DATA:  Initial evaluation for acute shortness of breath and chest pain. EXAM: PORTABLE CHEST 1 VIEW COMPARISON:  Prior radiograph from 12/12/2017. FINDINGS: Moderate cardiomegaly, stable. Mediastinal silhouette within normal limits. Lungs mildly hypoinflated. Diffuse vascular congestion with interstitial prominence, suggesting mild diffuse pulmonary interstitial edema. Superimposed linear scarring and/or atelectasis at the  peripheral mid left lung. No consolidative opacity. No appreciable pleural effusion. No pneumothorax. No acute osseous abnormality. IMPRESSION: Cardiomegaly with mild diffuse pulmonary interstitial edema, suggesting CHF. Electronically Signed   By: Rise Mu M.D.   On: 12/14/2017 22:45    ECG & Cardiac Imaging    2D Echocardiogram9.27.2019  Echocardiogram done on September 27:  - Left ventricle: The cavity size was severely dilated. There was mild concentric hypertrophy. Systolic function was severely reduced. The estimated ejection fraction was in the range of 20% to 25%. Diffuse hypokinesis. Regional wall motion abnormalities cannot be excluded. - Mitral valve: There was mild regurgitation. - Left atrium: The atrium was mildly dilated. - Right ventricle: Poorly visualized. Systolic function was mildly reduced. - Pulmonary arteries: Systolic pressure was mildly elevated. PA peak pressure: 36 mm Hg (S). _____________  Right and left heart catheterization9.30.2019 1. Normal coronary arteries. 2. Severely reduced LV systolic function by echo. Left ventricular angiography was not performed. 3. Right heart catheterization showed severely elevated filling pressures with pulmonary capillary wedge pressure of 33 mmHg, moderate to severe pulmonary hypertension and moderately reduced cardiac output at 4.8 with a cardiac index of 1.79.  Assessment & Plan    1.  Acute systolic congestive heart failure/nonischemic cardiomyopathy: Patient admitted to [facility] regional September 26 with progressive dyspnea, edema, increasing abdominal girth, and orthopnea.  He was found to be in SVT in the setting of WPW upon arrival which broke spontaneously.  Work-up has included echocardiogram which showed LV dysfunction with an EF 20 to 25% diffuse hypokinesis.  Diagnostic catheterization showed normal coronary arteries with markedly elevated filling pressures on September 30.  He  has been aggressively diuresed and thus far is -10.7 L with 7.4 kg weight loss since admission.  He has improved significantly clinically and renal function has remained relatively stable.  His bicarb has bumped slightly overnight and I will  drop his Lasix dose to 40 mg IV twice daily.  I suspect he will be ready for conversion to oral therapy within the next 24 to 48 hours.  He will otherwise remain on beta-blocker, Entresto, and Spironolactone therapy.  If pressure allows, would like to titrate Entresto further within the next day or 2.  2.  SVT/WPW: Patient presented with SVT at 190 bpm.  No recurrence.  Continue beta-blocker.  Discussed with elective physiology at [facility] and he will be transferred over there today for admission and EP evaluation/EP study.  3.  Type 2 diabetes mellitus: A1c 12.4.  Continue current insulin therapy.  4.  Essential hypertension: This was previously poorly controlled however is currently stable on beta-blocker, ARNI, and MRA.  5.  Morbid obesity: Patient will benefit from cardia pulmonary rehab following discharge.  6.  Tobacco/marijuana abuse: Patient was smoking marijuana daily and smokes less than 1 pack of cigarettes per week.  We discussed need for complete cessation and he seems willing to quit.  Signed, Nicolasa Ducking, NP 12/21/2017, 3:40 PM

## 2017-12-21 NOTE — Progress Notes (Signed)
Inpatient Diabetes Program Recommendations  AACE/ADA: New Consensus Statement on Inpatient Glycemic Control (2019)  Target Ranges:  Prepandial:   less than 140 mg/dL      Peak postprandial:   less than 180 mg/dL (1-2 hours)      Critically ill patients:  140 - 180 mg/dL  Results for ARDIS, VOTTO (MRN [id number]) as of 12/21/2017 07:55  Ref. Range 12/20/2017 08:14 12/20/17 10:33 12/20/2017 11:47 12/20/2017 16:54 12/20/2017 20:55 12/21/2017 07:50  Glucose-Capillary Latest Ref Range: 70 - 99 mg/dL 034 (H)  Novolog 7 units     Levemir 20 units 247 (H)  Novolog 7 units 245 (H)  Novolog 7 units 262 (H)  Novolog 3 units 261 (H)    Review of Glycemic Control  Diabetes history: DM2 Outpatient Diabetes medications: Metformin XR 750 mg daily Current orders for Inpatient glycemic control: Levemir 20 units daily, Novolog 0-20 TID with meals, Novolog 0-5 units QHS  Inpatient Diabetes Program Recommendations:  Insulin-Basal: Please consider increasing Levemir to 25 units daily. Insulin - Meal Coverage: Please consider ordering Novolog 5 units TID with meals for meal coverage if patient eats at least 50% of meals.  HgbA1C: A1C 12.4% on 12/16/17 indicating an average glucose of 309 mg/dl. Patient will need additional DM medications as an outpatient for DM control. Noted patient is refusing to be discharged on insulin. May want to consider discharging on Metformin XR 750 mg daily, Jardiance 10 mg daily (Dr. Okey Dupre noted to consider adding Jardiance a discharge per note on 12/20/17), and Glipizide XL 5 mg daily.  Recommend patient begin checking glucose at home and follow up with PCP within 1 week from discharge.  Thanks, Orlando Penner, RN, MSN, CDE Diabetes Coordinator Inpatient Diabetes Program [phone] (Team Pager from 8am to 5pm)

## 2017-12-21 NOTE — Progress Notes (Signed)
Received phone call from lab that morning lab draw was unable to be used due to sample hemolyzing. Lab in to draw new sample, but patient refused.

## 2017-12-22 ENCOUNTER — Encounter (HOSPITAL_COMMUNITY)
Admission: AD | Disposition: A | Discharge status: Home / Self Care | Payer: Self-pay | Source: Other Acute Inpatient Hospital | Attending: Internal Medicine

## 2017-12-22 ENCOUNTER — Ambulatory Visit (HOSPITAL_COMMUNITY): Admission: RE | Admit: 2017-12-22 | Payer: 59 | Source: Ambulatory Visit | Admitting: Internal Medicine

## 2017-12-22 DIAGNOSIS — I456 Pre-excitation syndrome: Secondary | ICD-10-CM

## 2017-12-22 HISTORY — PX: PVC ABLATION: EP1236

## 2017-12-22 LAB — GLUCOSE, CAPILLARY
GLUCOSE-CAPILLARY: 183 mg/dL — AB (ref 70–99)
GLUCOSE-CAPILLARY: 310 mg/dL — AB (ref 70–99)
GLUCOSE-CAPILLARY: 282 mg/dL — AB (ref 70–99)
Glucose-Capillary: 237 mg/dL — ABNORMAL HIGH (ref 70–99)

## 2017-12-22 LAB — BASIC METABOLIC PANEL
ANION GAP: 13 (ref 5–15)
BUN: 17 mg/dL (ref 6–20)
CHLORIDE: 99 mmol/L (ref 98–111)
CO2: 26 mmol/L (ref 22–32)
Calcium: 9 mg/dL (ref 8.9–10.3)
Creatinine, Ser: 0.95 mg/dL (ref 0.61–1.24)
GFR calc non Af Amer: >60 mL/min (ref >60)
Glucose, Bld: 252 mg/dL — ABNORMAL HIGH (ref 70–99)
Potassium: 4.4 mmol/L (ref 3.5–5.1)
Sodium: 138 mmol/L (ref 135–145)

## 2017-12-22 SURGERY — PVC ABLATION

## 2017-12-22 MED ORDER — ONDANSETRON HCL 4 MG/2ML IJ SOLN: 4.0000 mg | Freq: Four times a day (QID) | INTRAMUSCULAR | Status: DC | PRN

## 2017-12-22 MED ORDER — HEPARIN (PORCINE) IN NACL 1000-0.9 UT/500ML-% IV SOLN
INTRAVENOUS | Status: DC | PRN
  Administered 2017-12-22: 500 mL

## 2017-12-22 MED ORDER — SODIUM CHLORIDE 0.9% FLUSH: 3.0000 mL | INTRAVENOUS | Status: DC | PRN

## 2017-12-22 MED ORDER — BUPIVACAINE HCL (PF) 0.25 % IJ SOLN
INTRAMUSCULAR | Status: DC | PRN
  Administered 2017-12-22 (×2): 60 mL

## 2017-12-22 MED ORDER — SODIUM CHLORIDE 0.9 % IV SOLN: 250.0000 mL | INTRAVENOUS | Status: DC | PRN

## 2017-12-22 MED ORDER — ACETAMINOPHEN 325 MG PO TABS: 650.0000 mg | ORAL_TABLET | ORAL | Status: DC | PRN

## 2017-12-22 MED ORDER — SODIUM CHLORIDE 0.9% FLUSH
3.0000 mL | Freq: Two times a day (BID) | INTRAVENOUS | Status: DC
  Administered 2017-12-22 – 2017-12-25 (×7): 3 mL via INTRAVENOUS

## 2017-12-22 MED ORDER — SODIUM CHLORIDE 0.9 % IV SOLN
INTRAVENOUS | Status: AC | PRN
  Administered 2017-12-22: 50 mL/h via INTRAVENOUS

## 2017-12-22 MED ORDER — MIDAZOLAM HCL 5 MG/5ML IJ SOLN
INTRAMUSCULAR | Status: DC | PRN
  Administered 2017-12-22: 2 mg via INTRAVENOUS
  Administered 2017-12-22 (×3): 1 mg via INTRAVENOUS

## 2017-12-22 MED ORDER — FENTANYL CITRATE (PF) 100 MCG/2ML IJ SOLN
INTRAMUSCULAR | Status: DC | PRN
  Administered 2017-12-22: 25 ug via INTRAVENOUS
  Administered 2017-12-22 (×3): 12.5 ug via INTRAVENOUS

## 2017-12-22 SURGICAL SUPPLY — 12 items
CATH EZ STEER NAV 4MM D-F CUR (ABLATOR) ×2 IMPLANT
CATH JOSEPH QUAD ALLRED 6F REP (CATHETERS) ×2 IMPLANT
CATH POLARIS X 2.5/5/2.5 DECAP (CATHETERS) ×2 IMPLANT
CATH WEBSTER BI DIR CS D-F CRV (CATHETERS) ×2 IMPLANT
HOVERMATT SINGLE USE (MISCELLANEOUS) ×2 IMPLANT
PACK EP LATEX FREE (CUSTOM PROCEDURE TRAY) ×1
PACK EP LF (CUSTOM PROCEDURE TRAY) ×1 IMPLANT
PAD PRO RADIOLUCENT 2001M-C (PAD) ×2 IMPLANT
PATCH CARTO3 (PAD) ×2 IMPLANT
SHEATH PINNACLE 6F 10CM (SHEATH) ×4 IMPLANT
SHEATH PINNACLE 7F 10CM (SHEATH) ×2 IMPLANT
SHEATH PINNACLE 8F 10CM (SHEATH) ×4 IMPLANT

## 2017-12-22 NOTE — Progress Notes (Signed)
Site area: Right groin a 7,and 8 french venous sheaths X2  Was removed   Site Prior to Removal:  Level 0  Pressure Applied For 20 MINUTES    Bedrest Beginning at 1430p  Manual:   Yes.    Patient Status During Pull:  stable  Post Pull Groin Site:  Level 0  Post Pull Instructions Given:  Yes.    Post Pull Pulses Present:  Yes.    Dressing Applied:  Yes.    Comments:  VS remain stable

## 2017-12-22 NOTE — Consult Note (Addendum)
Cardiology Consultation:   Patient ID: Alvin Phillips MRN: [id number]; DOB: 07/19/1980  Admit date: 12/21/2017 Date of Consult: 12/22/2017  Primary Care Provider: Eustaquio Boyden, MD Primary Cardiologist: Alvin Kendall, MD  Primary Electrophysiologist:  New, dr. Ladona Phillips   Patient Profile:   [name] is a 37 y.o. male with a hx of Morbid obesity, OSA w/CPAP (nonecompliant), HTN, DM, GERD, WPW who is being seen today for the evaluation of SVT/WPW at the request of Dr. Okey Dupre.  History of Present Illness:   [name] was admitted [facility] 12/14/17 with SVT, acute CHF exacerbation.  Over approximately 2 to 3-week.  Through early in mid September, he was having more frequent palpitations and also increasing dyspnea, orthopnea, lower extremity edema, and increasing abdominal girth.  On September 26, he had a 2-hour episode of persistent tachycardia and dyspnea associated with fatigue.  He presented to the emergency department and was found to be in SVT.  In the ER, he converted spontaneously.  ECG notable for delta waves consistent with Wolff-Parkinson-White syndrome.  CT angiogram of the chest was negative for pulmonary embolism but did show CHF and interstitial edema.  He was markedly volume overloaded on exam and was placed on intravenous Lasix.  echocardiogram showed severe LV dysfunction with an EF of 20 to 25% and diffuse hypokinesis, diagnostic catheterization which showed normal coronary arteries with elevated filling pressures and a wedge of 33.  He was aggressively diuresed over 4 days with significant clinical improvement. Was -10.7 L from admission with a reduction in weight of 7.4 kg. He has had significant improvement in breathing as well as lower extremity swelling and abdominal girth.  The case discussed with Dr. Ladona Phillips with decision to transfer to [facility] for EPS/ablation of his WPW (planned for today), and continued management of care.  LABS today K+ 4.4 BUN/Creat  17/0.95 Yesterday WBC 7.1 H/H 14/46 Plts 364  He feels well this morning, no active SOB, edema nearly resolved.  No CP, no palpitations.  Past Medical History:  Diagnosis Date  . Diabetes mellitus without complication (HCC) 07/10/13   referred to DSME, did not show (04/2014)  . GERD (gastroesophageal reflux disease)   . HFrEF (heart failure with reduced ejection fraction) (HCC)    a. 11/2017 Echo: EF 20-25%, diff HK.  Marland Kitchen Hypertension   . Marijuana abuse   . Morbidly obese (HCC) 03/24/2014  . NICM (nonischemic cardiomyopathy) (HCC)    a. 11/2017 Echo: EF 20-25%, diff HK, mild MR, mildy dil LA, PASP ; b. 11/2017 Cath: nl cors. PCWP . CO/CI 4.8/1.79.  Marland Kitchen OSA (obstructive sleep apnea) 10/29/2013  . Tobacco abuse   . Wolff-Parkinson-White (WPW) syndrome    a. 11/2017 Admitted w/ SVT @ 190 bpm.    Past Surgical History:  Procedure Laterality Date  . FRACTURE SURGERY Left 2002   fractured arm  . HERNIA REPAIR  12-16-13   umbilical  . RIGHT/LEFT HEART CATH AND CORONARY ANGIOGRAPHY N/A 12/18/2017   Procedure: RIGHT/LEFT HEART CATH AND CORONARY ANGIOGRAPHY;  Surgeon: Iran Ouch, MD;  Location: [facility] INVASIVE CV LAB;  Service: Cardiovascular;  Laterality: N/A;     Home Medications:  Prior to Admission medications   Medication Sig Start Date End Date Taking? Authorizing Provider  aspirin EC 81 MG tablet Take 1 tablet (81 mg total) by mouth daily. 12/12/17   Alvin Boyden, MD  furosemide (LASIX) 20 MG tablet Take 1 tablet (20 mg total) by mouth daily as needed for fluid or edema. 12/12/17  Alvin Boyden, MD  glucose blood [facility] NEXT TEST) test strip Use as instructed 10/24/16   Alvin Boyden, MD  ibuprofen (ADVIL,MOTRIN) 200 MG tablet Take 200 mg by mouth as needed.    [provider]  insulin aspart (NOVOLOG) 100 UNIT/ML injection Inject 5 Units into the skin 3 (three) times daily with meals. 12/21/17   Altamese Dilling, MD  insulin detemir  (LEVEMIR) 100 UNIT/ML injection Inject 0.25 mLs (25 Units total) into the skin daily. 12/22/17   Altamese Dilling, MD  Lancets [facility] ULTRASOFT) lancets Use as instructed 10/24/16   Alvin Boyden, MD  metoprolol succinate (TOPROL-XL) 50 MG 24 hr tablet Take 1 tablet (50 mg total) by mouth daily. Take with or immediately following a meal. 12/22/17   Altamese Dilling, MD  pantoprazole (PROTONIX) 20 MG tablet Take 1 tablet (20 mg total) by mouth daily. Patient taking differently: Take 20 mg by mouth daily. Takes as needed 10/26/17 10/26/18  Bedsole, Amy E, MD  sacubitril-valsartan (ENTRESTO) 24-26 MG Take 1 tablet by mouth 2 (two) times daily. 12/21/17   Altamese Dilling, MD  spironolactone (ALDACTONE) 25 MG tablet Take 1 tablet (25 mg total) by mouth daily. 12/22/17   Altamese Dilling, MD    Inpatient Medications: Scheduled Meds: . aspirin EC  81 mg Oral Daily  . furosemide  40 mg Intravenous BID  . heparin  5,000 Units Subcutaneous Q8H  . insulin aspart  0-15 Units Subcutaneous TID WC  . insulin detemir  25 Units Subcutaneous Daily  . metoprolol succinate  50 mg Oral Daily  . sacubitril-valsartan  1 tablet Oral BID  . sodium chloride flush  3 mL Intravenous Q12H  . spironolactone  25 mg Oral Daily   Continuous Infusions: . sodium chloride     PRN Meds: sodium chloride, acetaminophen, ondansetron (ZOFRAN) IV, sodium chloride flush  Allergies:   No Known Allergies  Social History:   Social History   Socioeconomic History  . Marital status: Single    Spouse name: Not on file  . Number of children: 2  . Years of education: Not on file  . Highest education level: Not on file  Occupational History  . Occupation: Advertising account executive: SELF-EMPLOYED    Comment: [employer]  Social Needs  . Financial resource strain: Not on file  . Food insecurity:    Worry: Not on file    Inability: Not on file  . Transportation needs:    Medical: Not  on file    Non-medical: Not on file  Tobacco Use  . Smoking status: Former Smoker    Packs/day: 0.25    Years: 15.00    Pack years: 3.75    Types: Cigarettes    Last attempt to quit: 2019    Years since quitting: 0.7  . Smokeless tobacco: Never Used  Substance and Sexual Activity  . Alcohol use: Yes    Alcohol/week: 0.0 standard drinks    Comment: Occassional beer, shots of liquor  . Drug use: Yes    Frequency: 2.0 times per week    Types: Marijuana    Comment: 1 time per week; none in 4-6 months.  . Sexual activity: Not on file  Lifestyle  . Physical activity:    Days per week: Not on file    Minutes per session: Not on file  . Stress: Not on file  Relationships  . Social connections:    Talks on phone: Not on file    Gets  together: Not on file    Attends religious service: Not on file    Active member of club or organization: Not on file    Attends meetings of clubs or organizations: Not on file    Relationship status: Not on file  . Intimate partner violence:    Fear of current or ex partner: Not on file    Emotionally abused: Not on file    Physically abused: Not on file    Forced sexual activity: Not on file  Other Topics Concern  . Not on file  Social History Narrative   Lives with girlfriend   Tyme grew up in [place]. He is currently living in [place]. [name], [place]). He breeds dogs Medical sales representative).    Family History:   Family History  Problem Relation Age of Onset  . Diabetes Maternal Grandmother   . Heart attack Maternal Grandmother 80  . Diabetes Paternal Grandfather      ROS:  Please see the history of present illness.  All other ROS reviewed and negative.     Physical Exam/Data:   Vitals:   12/21/17 1805 12/21/17 1932 12/21/17 2319 12/22/17 0413  BP:  125/71 112/72 105/78  Pulse:  83 86 78  Resp:  20 18 20   Temp:  97.7 F (36.5 C) 97.7 F (36.5 C) 97.7 F (36.5 C)  TempSrc:  Oral Oral Oral  SpO2:   96% 98% 94%  Weight: (!) 161.5 kg   (!) 159 kg  Height: 5\' 10"  (1.778 m)       Intake/Output Summary (Last 24 hours) at 12/22/2017 0819 Last data filed at 12/22/2017 0400 Gross per 24 hour  Intake 200 ml  Output 1850 ml  Net -1650 ml   Filed Weights   12/21/17 1805 12/22/17 0413  Weight: (!) 161.5 kg (!) 159 kg   Body mass index is 50.31 kg/m.  General:  Well nourished, well developed, in no acute distress HEENT: normal Lymph: difficult to assess 2/2 body habitus Neck: JVD difficult to assess Endocrine:  No thryomegaly Vascular: No carotid bruits Cardiac:  RRR; no murmurs, gallops or rubs Lungs: CTA b/l, no wheezing, rhonchi or rales  Abd: soft, nontender, obese Ext: trace edema Musculoskeletal:  No deformities Skin: warm and dry  Neuro:  no gross focal abnormalities noted Psych:  Normal affect   EKG:  The EKG was personally reviewed and demonstrates:   SR 96bpm, +pre-excitation SVT 190bpm, QRS 58ms Telemetry:  Telemetry was personally reviewed and demonstrates:   SR, no SVT here  Relevant CV Studies:  12/15/17: TTE Study Conclusions - Left ventricle: The cavity size was severely dilated. There was   mild concentric hypertrophy. Systolic function was severely   reduced. The estimated ejection fraction was in the range of 20%   to 25%. Diffuse hypokinesis. Regional wall motion abnormalities   cannot be excluded. - Mitral valve: There was mild regurgitation. - Left atrium: The atrium was mildly dilated. - Right ventricle: Poorly visualized. Systolic function was mildly   reduced. - Pulmonary arteries: Systolic pressure was mildly elevated. PA   peak pressure: 36 mm Hg (S).  12/18/17: LHC .  Normal coronary arteries. 2.  Severely reduced LV systolic function by echo.  Left ventricular angiography was not performed. 3.  Right heart catheterization showed severely elevated filling pressures with pulmonary capillary wedge pressure of 33 mmHg, moderate to severe  pulmonary hypertension and moderately reduced cardiac output at 4.8 with a cardiac index of  1.79. Recommendations: The patient has nonischemic cardiomyopathy.  He continues to be significantly volume overloaded.  I recommend continuing IV diuresis for at least another day.  I switch losartan to Entresto.    Laboratory Data:  Chemistry Recent Labs  Lab 12/20/17 0636 12/21/17 1127 12/21/17 1954 12/22/17 0608  NA 137 137  --  138  K 4.0 4.1  --  4.4  CL 100 97*  --  99  CO2 28 30  --  26  GLUCOSE 255* 298*  --  252*  BUN 18 18  --  17  CREATININE 0.70 0.84 1.06 0.95  CALCIUM 8.8* 8.9  --  9.0  GFRNONAA >60 >60 >60 >60  GFRAA >60 >60 >60 >60  ANIONGAP 9 10  --  13    No results for input(s): PROT, ALBUMIN, AST, ALT, ALKPHOS, BILITOT in the last 168 hours. Hematology Recent Labs  Lab 12/16/17 0542 12/18/17 0633 12/21/17 1954  WBC 7.4 6.3 7.1  RBC 4.57 4.70 5.10  HGB 13.5 14.2 14.7  HCT 41.9 42.4 46.9  MCV 91.6 90.4 92.0  MCH 29.5 30.3 28.8  MCHC 32.2 33.6 31.3  RDW 13.9 13.7 12.7  PLT 248 317 364   Cardiac EnzymesNo results for input(s): TROPONINI in the last 168 hours. No results for input(s): TROPIPOC in the last 168 hours.  BNPNo results for input(s): BNP, PROBNP in the last 168 hours.  DDimer No results for input(s): DDIMER in the last 168 hours.  Radiology/Studies:  No results found.  Assessment and Plan:   1. WPW     Palpitations, SVT     Planned for EPS/ablation today with Dr. Ladona Phillips      Dr. Ladona Phillips has seen and examined the patient, discussed the procedure, potential risks and benefits, the patient is agreeable to proceed.   2. NICM     Remains fluid neg, on IV lasix     BMET stable     On BB/Entresto, aldactone as well  3. HTN     Continue meds  4. DM     Continue current          For questions or updates, please contact [facility] Please consult [email or web address] for contact info under     Signed, Sheilah Pigeon, PA-C  12/22/2017  8:19 AM  EP Attending  Patient seen and examined. Agree with above. The patient is a morbidly obese man with WPW and orthodromic SVT who also has developed systolic heart failure, EF 25%. He presents today to undergo EP study and catheter ablation of his WPW. It appears that he has a right lateral AP. I have discussed the indications/risks/benefits/goals/expectations of the procedure with the patient and he wishes to proceed.  Leonia Reeves.D.

## 2017-12-22 NOTE — Plan of Care (Signed)
  Problem: Activity: Goal: Risk for activity intolerance will decrease Outcome: Completed/Met   Problem: Nutrition: Goal: Adequate nutrition will be maintained Outcome: Completed/Met   Problem: Coping: Goal: Level of anxiety will decrease Outcome: Completed/Met   Problem: Elimination: Goal: Will not experience complications related to bowel motility Outcome: Completed/Met Goal: Will not experience complications related to urinary retention Outcome: Completed/Met   Problem: Pain Managment: Goal: General experience of comfort will improve Outcome: Completed/Met   Problem: Safety: Goal: Ability to remain free from injury will improve Outcome: Completed/Met   Problem: Skin Integrity: Goal: Risk for impaired skin integrity will decrease Outcome: Completed/Met

## 2017-12-22 NOTE — Discharge Instructions (Signed)
Post procedure care instructions No driving for 4 days. No lifting over 5 lbs for 1 week. No vigorous or sexual activity for 1 week. You may return to work on 12/29/17. Keep procedure site clean & dry. If you notice increased pain, swelling, bleeding or pus, call/return!  You may shower, but no soaking baths/hot tubs/pools for 1 week.

## 2017-12-23 ENCOUNTER — Encounter (HOSPITAL_COMMUNITY): Payer: Self-pay | Admitting: General Practice

## 2017-12-23 ENCOUNTER — Other Ambulatory Visit: Payer: Self-pay

## 2017-12-23 LAB — BASIC METABOLIC PANEL
Anion gap: 11 (ref 5–15)
BUN: 15 mg/dL (ref 6–20)
CHLORIDE: 100 mmol/L (ref 98–111)
CO2: 26 mmol/L (ref 22–32)
CREATININE: 0.99 mg/dL (ref 0.61–1.24)
Calcium: 9.1 mg/dL (ref 8.9–10.3)
Glucose, Bld: 242 mg/dL — ABNORMAL HIGH (ref 70–99)
Potassium: 4.2 mmol/L (ref 3.5–5.1)
SODIUM: 137 mmol/L (ref 135–145)

## 2017-12-23 LAB — GLUCOSE, CAPILLARY
GLUCOSE-CAPILLARY: 225 mg/dL — AB (ref 70–99)
GLUCOSE-CAPILLARY: 213 mg/dL — AB (ref 70–99)
Glucose-Capillary: 229 mg/dL — ABNORMAL HIGH (ref 70–99)
Glucose-Capillary: 254 mg/dL — ABNORMAL HIGH (ref 70–99)

## 2017-12-23 MED ORDER — SOTALOL HCL 120 MG PO TABS
120.0000 mg | ORAL_TABLET | Freq: Two times a day (BID) | ORAL | Status: DC
  Administered 2017-12-23 – 2017-12-24 (×3): 120 mg via ORAL
  Filled 2017-12-23: qty 1
  Filled 2017-12-23: qty 1.5
  Filled 2017-12-23 (×2): qty 1

## 2017-12-23 NOTE — Progress Notes (Signed)
Progress Note  Patient Name: Alvin Phillips Date of Encounter: 12/23/2017  Primary Cardiologist: Alvin Kendall, MD   Subjective   No chest pain or sob. No palpitations  Inpatient Medications    Scheduled Meds: . aspirin EC  81 mg Oral Daily  . furosemide  40 mg Intravenous BID  . heparin  5,000 Units Subcutaneous Q8H  . insulin aspart  0-15 Units Subcutaneous TID WC  . insulin detemir  25 Units Subcutaneous Daily  . metoprolol succinate  50 mg Oral Daily  . sacubitril-valsartan  1 tablet Oral BID  . sodium chloride flush  3 mL Intravenous Q12H  . sodium chloride flush  3 mL Intravenous Q12H  . spironolactone  25 mg Oral Daily   Continuous Infusions: . sodium chloride    . sodium chloride     PRN Meds: sodium chloride, sodium chloride, acetaminophen, ondansetron (ZOFRAN) IV, sodium chloride flush, sodium chloride flush   Vital Signs    Vitals:   12/22/17 1700 12/22/17 1800 12/22/17 1923 12/23/17 0619  BP: 115/74 104/75 113/72 (!) 93/56  Pulse: 86 84 88 74  Resp:   18 18  Temp:   98.3 F (36.8 C) 98.6 F (37 C)  TempSrc:   Oral Oral  SpO2:   98% 98%  Weight:    (!) 158.1 kg  Height:        Intake/Output Summary (Last 24 hours) at 12/23/2017 1020 Last data filed at 12/23/2017 0959 Gross per 24 hour  Intake 750 ml  Output 2000 ml  Net -1250 ml   Filed Weights   12/21/17 1805 12/22/17 0413 12/23/17 0619  Weight: (!) 161.5 kg (!) 159 kg (!) 158.1 kg    Telemetry    nsr with ventricular pre-excitation - Personally Reviewed  ECG    NSR with ventricular pre-excitation - Personally Reviewed  Physical Exam   GEN: No acute distress.   Neck: No JVD Cardiac: RRR, no murmurs, rubs, or gallops.  Respiratory: Clear to auscultation bilaterally. GI: Soft, morbidly obese, nontender, non-distended  MS: No edema; No deformity. Neuro:  Nonfocal  Psych: Normal affect   Labs    Chemistry Recent Labs  Lab 12/21/17 1127 12/21/17 1954 12/22/17 0608  12/23/17 0621  NA 137  --  138 137  K 4.1  --  4.4 4.2  CL 97*  --  99 100  CO2 30  --  26 26  GLUCOSE 298*  --  252* 242*  BUN 18  --  17 15  CREATININE 0.84 1.06 0.95 0.99  CALCIUM 8.9  --  9.0 9.1  GFRNONAA >60 >60 >60 >60  GFRAA >60 >60 >60 >60  ANIONGAP 10  --  13 11     Hematology Recent Labs  Lab 12/18/17 0633 12/21/17 1954  WBC 6.3 7.1  RBC 4.70 5.10  HGB 14.2 14.7  HCT 42.4 46.9  MCV 90.4 92.0  MCH 30.3 28.8  MCHC 33.6 31.3  RDW 13.7 12.7  PLT 317 364    Cardiac EnzymesNo results for input(s): TROPONINI in the last 168 hours. No results for input(s): TROPIPOC in the last 168 hours.   BNPNo results for input(s): BNP, PROBNP in the last 168 hours.   DDimer No results for input(s): DDIMER in the last 168 hours.   Radiology    No results found.  Cardiac Studies   none  Patient Profile     37 y.o. male admitted for EPS/RFA after presenting with acute decompensated newly diagnosed systolic  heart failure.   Assessment & Plan    1. WPW - he was found at EP study to have a manifest right mid septal AP. His AP was less than a cm from the AV node. Successful ablation was accomplished. We watched the patient for 40 minutes after the procedure and he left the lab without WPW. During the ablation we had junctional rhythm with retrograde block to the atrium indicating that had we continued ablation he would end up with complete heart block. Unfortunately his WPW conduction has returned. I have discussed the findings with the patient. He will be started on sotalol. I do not think an alternative energy modality (cryo) would likely change the result of ablation. He will remain in the hospital for an additional 48 hours.  Alvin Phillips,M.D.  For questions or updates, please contact [facility] Please consult [email or web address] for contact info under Cardiology/STEMI.      Signed, Alvin Bunting, MD  12/23/2017, 10:20 AM  Patient ID: Alvin Phillips, male   DOB: [birth year], 37  y.o.   MRN: [id number]

## 2017-12-24 DIAGNOSIS — I5021 Acute systolic (congestive) heart failure: Secondary | ICD-10-CM

## 2017-12-24 LAB — BASIC METABOLIC PANEL
ANION GAP: 9 (ref 5–15)
BUN: 16 mg/dL (ref 6–20)
CO2: 27 mmol/L (ref 22–32)
Calcium: 9.2 mg/dL (ref 8.9–10.3)
Chloride: 100 mmol/L (ref 98–111)
Creatinine, Ser: 1.04 mg/dL (ref 0.61–1.24)
GFR calc Af Amer: >60 mL/min (ref >60)
GFR calc non Af Amer: >60 mL/min (ref >60)
GLUCOSE: 227 mg/dL — AB (ref 70–99)
Potassium: 4.5 mmol/L (ref 3.5–5.1)
Sodium: 136 mmol/L (ref 135–145)

## 2017-12-24 LAB — GLUCOSE, CAPILLARY
GLUCOSE-CAPILLARY: 215 mg/dL — AB (ref 70–99)
GLUCOSE-CAPILLARY: 198 mg/dL — AB (ref 70–99)
Glucose-Capillary: 228 mg/dL — ABNORMAL HIGH (ref 70–99)
Glucose-Capillary: 254 mg/dL — ABNORMAL HIGH (ref 70–99)

## 2017-12-24 MED ORDER — SOTALOL HCL 80 MG PO TABS
160.0000 mg | ORAL_TABLET | Freq: Two times a day (BID) | ORAL | Status: DC
  Administered 2017-12-24 – 2017-12-25 (×2): 160 mg via ORAL
  Filled 2017-12-24 (×2): qty 2

## 2017-12-24 NOTE — Progress Notes (Signed)
Progress Note  Patient Name: Alvin Phillips Date of Encounter: 12/24/2017  Primary Cardiologist: Yvonne Kendall, MD   Subjective   No chest pain or sob. No dizziness  Inpatient Medications    Scheduled Meds: . aspirin EC  81 mg Oral Daily  . furosemide  40 mg Intravenous BID  . heparin  5,000 Units Subcutaneous Q8H  . insulin aspart  0-15 Units Subcutaneous TID WC  . insulin detemir  25 Units Subcutaneous Daily  . sacubitril-valsartan  1 tablet Oral BID  . sodium chloride flush  3 mL Intravenous Q12H  . sodium chloride flush  3 mL Intravenous Q12H  . sotalol  160 mg Oral Q12H  . spironolactone  25 mg Oral Daily   Continuous Infusions: . sodium chloride    . sodium chloride     PRN Meds: sodium chloride, sodium chloride, acetaminophen, ondansetron (ZOFRAN) IV, sodium chloride flush, sodium chloride flush   Vital Signs    Vitals:   12/23/17 2215 12/24/17 0429 12/24/17 0435 12/24/17 0906  BP: 116/66  (!) 112/92 104/71  Pulse:   72 74  Resp:   16   Temp:   98.1 F (36.7 C)   TempSrc:   Oral   SpO2:   96%   Weight:  (!) 157.9 kg    Height:        Intake/Output Summary (Last 24 hours) at 12/24/2017 1030 Last data filed at 12/24/2017 0431 Gross per 24 hour  Intake 600 ml  Output 2600 ml  Net -2000 ml   Filed Weights   12/22/17 0413 12/23/17 0619 12/24/17 0429  Weight: (!) 159 kg (!) 158.1 kg (!) 157.9 kg    Telemetry    nsr with ventricular pre-excitation - Personally Reviewed  ECG    none - Personally Reviewed  Physical Exam   GEN: No acute distress, morbidly obese Neck: 6 cm JVD Cardiac: RRR, no murmurs, rubs, or gallops.  Respiratory: Clear to auscultation bilaterally. GI: Soft, nontender, non-distended  MS: No edema; No deformity. Neuro:  Nonfocal  Psych: Normal affect   Labs    Chemistry Recent Labs  Lab 12/22/17 0608 12/23/17 0621 12/24/17 0622  NA 138 137 136  K 4.4 4.2 4.5  CL 99 100 100  CO2 26 26 27   GLUCOSE 252* 242* 227*    BUN 17 15 16   CREATININE 0.95 0.99 1.04  CALCIUM 9.0 9.1 9.2  GFRNONAA >60 >60 >60  GFRAA >60 >60 >60  ANIONGAP 13 11 9      Hematology Recent Labs  Lab 12/18/17 0633 12/21/17 1954  WBC 6.3 7.1  RBC 4.70 5.10  HGB 14.2 14.7  HCT 42.4 46.9  MCV 90.4 92.0  MCH 30.3 28.8  MCHC 33.6 31.3  RDW 13.7 12.7  PLT 317 364    Cardiac EnzymesNo results for input(s): TROPONINI in the last 168 hours. No results for input(s): TROPIPOC in the last 168 hours.   BNPNo results for input(s): BNP, PROBNP in the last 168 hours.   DDimer No results for input(s): DDIMER in the last 168 hours.   Radiology    No results found.  Cardiac Studies   none  Patient Profile     37 y.o. male admitted with worsening CHF and WPW, s/p mid septal AP ablation that was acutely successful but pathway has returned several hours after ablation.  Assessment & Plan    1. Midseptal AP/WPW syndrome - he is s/p ablation and had delayed return of his AP. Additional  ablation would result in CHB. I have started sotalol. He still has evidence of AP conduction and we will uptitrate to 160 bid. Anticipate DC home tomorrow if ok. 2. Obesity - we discussed the importance of weight loss. 3. New onset systolic CHF - he appears euvolemic. He will continue his current meds. We stopped toprol and switched to sotalol.  4.  HTN - his blood pressure now on the low side. If it gets lower, we will cut back on his entresto.   For questions or updates, please contact [facility] Please consult [email or web address] for contact info under Cardiology/STEMI.      Signed, Alvin Bunting, MD  12/24/2017, 10:30 AM  Patient ID: Alvin Phillips, male   DOB: 1980-06-13, 37 y.o.   MRN: [id number]

## 2017-12-24 NOTE — Progress Notes (Signed)
Requesting missing 1000 medications from pharmacy

## 2017-12-25 ENCOUNTER — Encounter (HOSPITAL_COMMUNITY): Payer: Self-pay | Admitting: Internal Medicine

## 2017-12-25 LAB — MAGNESIUM: MAGNESIUM: 1.8 mg/dL (ref 1.7–2.4)

## 2017-12-25 LAB — BASIC METABOLIC PANEL
Anion gap: 8 (ref 5–15)
BUN: 19 mg/dL (ref 6–20)
CALCIUM: 8.7 mg/dL — AB (ref 8.9–10.3)
CO2: 27 mmol/L (ref 22–32)
CREATININE: 1.08 mg/dL (ref 0.61–1.24)
Chloride: 100 mmol/L (ref 98–111)
GFR calc Af Amer: >60 mL/min (ref >60)
GLUCOSE: 204 mg/dL — AB (ref 70–99)
Potassium: 4.1 mmol/L (ref 3.5–5.1)
SODIUM: 135 mmol/L (ref 135–145)

## 2017-12-25 LAB — GLUCOSE, CAPILLARY: Glucose-Capillary: 235 mg/dL — ABNORMAL HIGH (ref 70–99)

## 2017-12-25 MED ORDER — SACUBITRIL-VALSARTAN 24-26 MG PO TABS: 1.0000 | ORAL_TABLET | Freq: Two times a day (BID) | ORAL | 0 refills | Status: DC

## 2017-12-25 MED ORDER — SOTALOL HCL 160 MG PO TABS: 160.0000 mg | ORAL_TABLET | Freq: Two times a day (BID) | ORAL | 6 refills | Status: DC

## 2017-12-25 NOTE — Progress Notes (Signed)
Inpatient Diabetes Program Recommendations  AACE/ADA: New Consensus Statement on Inpatient Glycemic Control (2019)  Target Ranges:  Prepandial:   less than 140 mg/dL      Peak postprandial:   less than 180 mg/dL (1-2 hours)      Critically ill patients:  140 - 180 mg/dL  Results for Alvin Phillips, Alvin Phillips (MRN [id number]) as of 12/25/2017 08:48  Ref. Range 12/24/2017 07:34 12/24/2017 11:18 12/24/2017 17:42 12/24/2017 20:53 12/25/2017 07:42  Glucose-Capillary Latest Ref Range: 70 - 99 mg/dL 549 (H) 826 (H) 415 (H) 198 (H) 235 (H)    Review of Glycemic Control  Diabetes history: DM2 Outpatient Diabetes medications: Metformin XR 750 mg daily Current orders for Inpatient glycemic control:Levemir 25 units daily, Novolog 0-15 TID with meals  Inpatient Diabetes Program Recommendations: Insulin - Basal: Please consider increasing Levemir to 28 units daily. Correction (SSI): Please consider ordering Novolog 0-5 units QHS for bedtime correction. Insulin - Meal Coverage: Please consider ordering Novolog 3 units TID with meals for meal coverage if patient eats at least 50% of meals.  Thanks, Orlando Penner, RN, MSN, CDE Diabetes Coordinator Inpatient Diabetes Program [phone] (Team Pager from 8am to 5pm)

## 2017-12-25 NOTE — Discharge Summary (Addendum)
DISCHARGE SUMMARY    Patient ID: Alvin Phillips,  MRN: [id number], DOB/AGE: 1980/04/11 37 y.o.  Admit date: 12/21/2017 Discharge date: 12/25/2017  Primary Care Physician: Eustaquio Boyden, MD  Primary Cardiologist: Dr. Okey Dupre Electrophysiologist: new, Dr. Ladona Ridgel  Primary Discharge Diagnosis:  1. WPW 2. SVT 3. Acute CHF  Secondary Discharge Diagnosis:  1. OSA (noncompliant w/CPAP) 2. Morbid obesity 3. HTN 4. DM  No Known Allergies   Procedures This Admission:  1.    Brief HPI: Alvin Phillips is a 37 y.o. male was initially admitted to [facility] 12/14/17 with SVT and an acute CHF exacerbation   Hospital Course:  The patient reported early in mid September, he was having more frequent palpitations and also increasing dyspnea, orthopnea, lower extremity edema, and increasing abdominal girth. On September 26, he had a 2-hour episode of persistent tachycardia and dyspnea associated with fatigue. He presented to the emergency department and was found to be in SVT. In the ER, he converted spontaneously. ECG notable for delta waves consistent with Wolff-Parkinson-White syndrome. CT angiogram of the chest was negative for pulmonary embolism but did show CHF and interstitial edema. He was markedly volume overloaded on exam and was placed on intravenous Lasix.  Echocardiogram showed severe LV dysfunction with an EF of 20 to 25% and diffuse hypokinesis, diagnostic catheterization which showed normal coronary arteries with elevated filling pressures and a wedge of 33.  He was aggressively diuresed over 4 days with significant clinical improvement. He was -10.7 L from admission with a reduction in weight of 7.4 kg. He has had significant improvement in breathing as well as lower extremity swelling and abdominal girth.  The case discussed with Dr. Ladona Ridgel with decision to transfer to [facility] for EPS/ablation of his WPW.  He underwent EPS ablation Friday 12/22/17, initially successful though  unfortunately had late return of his APW.  Dr. Ladona Ridgel felt any additional ablation would result in CHB, and his Toprol changed to Sotalol.  He remained fludi negative, while at [facility] another neg 6,660ml, and 4 more pounds.  BMET/electrolytes remained stable.  On telemetry he maintained SR, one NSVT (monomorphic) 6 beats, no SVT, QTc remained stable, both reviewed with Dr. Ladona Ridgel.  I discussed with the patient at length, sodium restriction, foods to avoid, as well as daily weights, when and how to use/take the furosemide.  Site care, and activity restrictions were reviewed with the patient.  The patient feels well this morning, no CP, palpitations or SOB, He refused this AM insulin, states he just wants to take his metformin when he gets home.  he was examined by Dr. Ladona Ridgel and considered stable for discharge to home.   He has a TOC visit next week, will need BMET, mag level and EKG then for his Sotalol, and 1 month with Dr. Ladona Ridgel.  We will plan for a 3 mo echo to be scheduled at the time of that visit.    Physical Exam: Vitals:   12/24/17 2155 12/24/17 2157 12/25/17 0529 12/25/17 0836  BP:  (!) 124/95 94/73 (!) 127/96  Pulse: (!) 105 78 70 74  Resp:   16   Temp:   98 F (36.7 C)   TempSrc:      SpO2: 96% 97% 97%   Weight:   (!) 157.6 kg   Height:        GEN- The patient is well appearing, alert and oriented x 3 today.   HEENT: normocephalic, atraumatic; sclera clear, conjunctiva pink; hearing  intact; oropharynx clear Lungs- CTA b/l, normal work of breathing.  No wheezes, rales, rhonchi Heart- RRR, no murmurs, rubs or gallops, PMI not laterally displaced GI- soft, non-tender, non-distended, bowel sounds present Extremities- no clubbing, cyanosis, or edema; R groin site is sift, no bleeding/ecchymosis, or hematoma, non-tender DP/PT 2+ bilaterally MS- no significant deformity or atrophy Skin- warm and dry, no rash or lesion Psych- euthymic mood, full affect Neuro- no gross  defecits  Labs:   Lab Results  Component Value Date   WBC 7.1 12/21/2017   HGB 14.7 12/21/2017   HCT 46.9 12/21/2017   MCV 92.0 12/21/2017   PLT 364 12/21/2017    Recent Labs  Lab 12/25/17 0525  NA 135  K 4.1  CL 100  CO2 27  BUN 19  CREATININE 1.08  CALCIUM 8.7*  GLUCOSE 204*    Discharge Medications:  Allergies as of 12/25/2017   No Known Allergies     Medication List    STOP taking these medications   insulin aspart 100 UNIT/ML injection Commonly known as:  novoLOG   insulin detemir 100 UNIT/ML injection Commonly known as:  LEVEMIR   lisinopril 10 MG tablet Commonly known as:  PRINIVIL,ZESTRIL   metoprolol succinate 50 MG 24 hr tablet Commonly known as:  TOPROL-XL   spironolactone 25 MG tablet Commonly known as:  ALDACTONE     TAKE these medications   aspirin EC 81 MG tablet Take 1 tablet (81 mg total) by mouth daily.   furosemide 20 MG tablet Commonly known as:  LASIX Take 1 tablet (20 mg total) by mouth daily as needed for fluid or edema.   glucose blood test strip Use as instructed   ibuprofen 200 MG tablet Commonly known as:  ADVIL,MOTRIN Take 200 mg by mouth as needed for moderate pain.   metFORMIN 750 MG 24 hr tablet Commonly known as:  GLUCOPHAGE-XR Take 750 mg by mouth daily with breakfast.   onetouch ultrasoft lancets Use as instructed   pantoprazole 20 MG tablet Commonly known as:  PROTONIX Take 1 tablet (20 mg total) by mouth daily. What changed:    when to take this  reasons to take this   sacubitril-valsartan 24-26 MG Commonly known as:  ENTRESTO Take 1 tablet by mouth 2 (two) times daily.   sotalol 160 MG tablet Commonly known as:  BETAPACE Take 1 tablet (160 mg total) by mouth every 12 (twelve) hours.       Disposition:  Home Discharge Instructions    Diet - low sodium heart healthy   Complete by:  As directed    Increase activity slowly   Complete by:  As directed      Follow-up Information     Marinus Maw, MD Follow up on 01/26/2018.   Specialty:  Cardiology Why:  4:30PM Contact information: [address] [place] [address] [phone]        Creig Hines, NP Follow up on 01/01/2018.   Specialties:  Nurse Practitioner, Cardiology, Radiology Why:  2:00PM Contact information: [address] [place] [address] [phone]           Duration of Discharge Encounter: Greater than 30 minutes including physician time.  Norma Fredrickson, PA-C 12/25/2017 12:15 PM   EP attending  Patient seen and examined.  Agree with the findings as noted above.  He has maintained sinus rhythm very nicely on sotalol.  His accessory pathway conduction remains present.  He will continue  his sotalol as well as his afterload reduction.  He is encouraged to lose weight.  I will see him back in several weeks.  I would anticipate repeating his 2D echo in approximately 3 months to see if his left ventricular function has improved.  Lewayne Bunting, MD

## 2017-12-25 NOTE — Progress Notes (Signed)
Patient refused morning Insulin coverage and Levemir .Re edcucated  About the importance of glucose control  but he said "I will wait for the doctor, if I will go home today I will take my Metformin at home" Milinda Pointer NP and made aware.

## 2017-12-26 ENCOUNTER — Ambulatory Visit: Payer: Self-pay | Admitting: *Deleted

## 2017-12-26 NOTE — Telephone Encounter (Signed)
Contacted pt regarding medication questions; discussed with pt discharge medications(medications stopped and medications to take) ; pt also given the number of his cardiologist, Dr Sharrell Ku, [phone]; he verbalized understanding.    Reason for Disposition . General information question, no triage required and triager able to answer question  Answer Assessment - Initial Assessment Questions 1. REASON FOR CALL or QUESTION: "What is your reason for calling today?" or "How can I best help you?" or "What question do you have that I can help answer?"     What medications am I supposed to take?  Protocols used: INFORMATION ONLY CALL-A-AH

## 2017-12-27 NOTE — Progress Notes (Signed)
BP 122/62 (BP Location: Left Arm, Patient Position: Sitting, Cuff Size: Large)   Pulse 71   Temp 97.8 F (36.6 C) (Oral)   Ht 5\' 9"  (1.753 m)   Wt (!) 351 lb (159.2 kg)   SpO2 95%   BMI 51.83 kg/m    CC: hosp f/u visit Subjective:    Patient ID: Alvin Phillips, male    DOB: 1980/07/03, 37 y.o.   MRN: [id number]  HPI: Alvin Phillips is a 37 y.o. male presenting on 12/28/2017 for Hospitalization Follow-up (Pt accompanied by his girlfriend, Alvin Phillips.)   Recent hospitalization at [facility] for SVT and acute CHF exacerbation in h/o WPW (had not seen cardiology). Spontaneous conversion in the ER. CTA chest negative for pulmonary embolism. Echo showed severe LV dysfunction, EF 20-25%. Cardiac catheterization showed normal coronary arteries. Treated with IV lasix for several days with net negative 10+ liters. Transferred 12/21/2017 to [facility] for EP eval and ablation. Underwent ablation 10/4, but had recurrent arrhythmia and sotalol was started. Also started on entresto.  Has f/u planned with cardiology for TCM visit.   Since home feeling well. Motivated to start walking routine. States this hospitalization has been eye opening experience and he is motivated to implement healthy changes, ready to take his health seriously.  DM - states insulin was too expensive. Taking metformin XR 750mg  daily and tolerating well. Needs to get new glucometer.  24 hour recall Breakfast - 2 boiled eggs, grilled chicken breast, toast  Snack - [place] sandwich on wheat, mustard, baked chips  Lunch - lima beans, corn, collard greens and pinto beans  Snack - grilled chicken on wheat, smart food white cheddar popcorn Dinner - rotisserie chicken, cabbage, corn, sherbert  Snack - walnut Drinking water/crystal light  Admit date: 12/14/2017 [facility]) Transfer date: 12/21/2017 Admit date: 12/21/2017 [facility]) Discharge date: 12/25/2017 TCM hosp f/u phone call not performed. Will have TCM visit with cardiology next week.  Primary Care  Physician: Alvin Boyden, MD  Primary Cardiologist: Dr. Okey Phillips Electrophysiologist: new, Dr. Ladona Phillips  Primary Discharge Diagnosis:  1. WPW 2. SVT 3. Acute CHF  Secondary Discharge Diagnosis:  1. OSA (noncompliant w/CPAP) 2. Morbid obesity 3. HTN 4. DM  Relevant past medical, surgical, family and social history reviewed and updated as indicated. Interim medical history since our last visit reviewed. Allergies and medications reviewed and updated. Outpatient Medications Prior to Visit  Medication Sig Dispense Refill  . aspirin EC 81 MG tablet Take 1 tablet (81 mg total) by mouth daily.    . furosemide (LASIX) 20 MG tablet Take 1 tablet (20 mg total) by mouth daily as needed for fluid or edema. 30 tablet 1  . glucose blood (BAYER CONTOUR NEXT TEST) test strip Use as instructed 100 each 5  . ibuprofen (ADVIL,MOTRIN) 200 MG tablet Take 200 mg by mouth as needed for moderate pain.     . Lancets (ONETOUCH ULTRASOFT) lancets Use as instructed 100 each 5  . sacubitril-valsartan (ENTRESTO) 24-26 MG Take 1 tablet by mouth 2 (two) times daily. 60 tablet 0  . sotalol (BETAPACE) 160 MG tablet Take 1 tablet (160 mg total) by mouth every 12 (twelve) hours. 60 tablet 6  . metFORMIN (GLUCOPHAGE-XR) 750 MG 24 hr tablet Take 750 mg by mouth daily with breakfast.    . pantoprazole (PROTONIX) 20 MG tablet Take 1 tablet (20 mg total) by mouth daily. (Patient taking differently: Take 20 mg by mouth as needed for heartburn or indigestion. ) 30 tablet 0  No facility-administered medications prior to visit.      Per HPI unless specifically indicated in ROS section below Review of Systems     Objective:    BP 122/62 (BP Location: Left Arm, Patient Position: Sitting, Cuff Size: Large)   Pulse 71   Temp 97.8 F (36.6 C) (Oral)   Ht 5\' 9"  (1.753 m)   Wt (!) 351 lb (159.2 kg)   SpO2 95%   BMI 51.83 kg/m   Wt Readings from Last 3 Encounters:  12/28/17 (!) 351 lb (159.2 kg)  12/25/17 (!) 347 lb 8  oz (157.6 kg)  12/21/17 (!) 353 lb 6.4 oz (160.3 kg)    Physical Exam  Constitutional: He appears well-developed and well-nourished. No distress.  HENT:  Head: Normocephalic and atraumatic.  Mouth/Throat: Oropharynx is clear and moist. No oropharyngeal exudate.  Cardiovascular: Normal rate, regular rhythm and normal heart sounds.  No murmur heard. Pulmonary/Chest: Effort normal and breath sounds normal. No respiratory distress. He has no wheezes. He has no rales.  Musculoskeletal: He exhibits no edema.  Skin: Skin is warm and dry. No rash noted.  Nursing note and vitals reviewed.   Lab Results  Component Value Date   HGBA1C 12.4 (H) 12/16/2017    Lab Results  Component Value Date   CREATININE 1.08 12/25/2017   BUN 19 12/25/2017   NA 135 12/25/2017   K 4.1 12/25/2017   CL 100 12/25/2017   CO2 27 12/25/2017    Echocardiogram 12/2017: EF 20-25%, severely dilated LV, mild concentric LVH, diffuse hypokinesis, mild MR, mildly dilated LA, mildly reduced RV systolic fxn, elevated PA pressure 36 mmHg.  R/L heart catheterization: Normal CA, severely elevated filling pressures with wedge pressure , mod-severe pulm HTN - nonischemic cardiomyopathy    Assessment & Plan:   Problem List Items Addressed This Visit    WPW (Wolff-Parkinson-White syndrome)    S/p ablation 12/2017 Now on sotalol.  Appreciate EP/cards care.       OSA (obstructive sleep apnea)    Pending sleep study Alvin Phillips).      Nonischemic cardiomyopathy (HCC)   Morbid obesity with BMI of 50.0-59.9, adult (HCC)    Motivated for ongoing efforts at weight loss. Reviewed AHA recommended 145min/week moderate intensity aerobic exercise - discussed slow titration of exercise, and not to start until Saturday (1 wk after ablation).       Relevant Medications   metFORMIN (GLUCOPHAGE-XR) 750 MG 24 hr tablet   HFrEF (heart failure with reduced ejection fraction) (HCC) - Primary    Recent hospital echo showed EF  20-25%. Now on lasix daily, entresto, and aspirin. Has f/u planned with cardiology next week.       Essential hypertension    Chronic, stable on new regimen of entresto and lasix daily.       Diabetes mellitus type 2, uncontrolled, with complications (HCC)    Chronic. Pt now motivated for healthy changes - has drastically changed diet, motivated to start walking routine. Discussed insulin vs oral medications and how we may not reach goal with oral medications alone with how high starting A1c is. He wants to start with diet/lifestyle changes and metformin - will increase to 1500mg  metformin ER daily. He will pick up new glucose meter. Reviewed goal range fasting and postprandial cbgs. Discussed DSME classes - will consider in the future. He did meet with nutritionist during his hospitalization. RTC 1 mo f/u visit.  24 hr food diary recall reviewed.      Relevant Medications  metFORMIN (GLUCOPHAGE-XR) 750 MG 24 hr tablet       Meds ordered this encounter  Medications  . metFORMIN (GLUCOPHAGE-XR) 750 MG 24 hr tablet    Sig: Take 2 tablets (1,500 mg total) by mouth daily with breakfast.    Dispense:  180 tablet    Refill:  3    Note new sig  . pantoprazole (PROTONIX) 20 MG tablet    Sig: Take 1 tablet (20 mg total) by mouth daily as needed for heartburn or indigestion.    Dispense:  30 tablet    Refill:  1   No orders of the defined types were placed in this encounter.   Follow up plan: No follow-ups on file.  Alvin Boyden, MD

## 2017-12-28 ENCOUNTER — Ambulatory Visit (INDEPENDENT_AMBULATORY_CARE_PROVIDER_SITE_OTHER): Payer: 59 | Admitting: Family Medicine

## 2017-12-28 ENCOUNTER — Encounter: Payer: Self-pay | Admitting: Family Medicine

## 2017-12-28 VITALS — BP 122/62 | HR 71 | Temp 97.8°F | Ht 69.0 in | Wt 351.0 lb

## 2017-12-28 DIAGNOSIS — IMO0002 Reserved for concepts with insufficient information to code with codable children: Secondary | ICD-10-CM

## 2017-12-28 DIAGNOSIS — E118 Type 2 diabetes mellitus with unspecified complications: Secondary | ICD-10-CM | POA: Diagnosis not present

## 2017-12-28 DIAGNOSIS — I5023 Acute on chronic systolic (congestive) heart failure: Secondary | ICD-10-CM | POA: Diagnosis not present

## 2017-12-28 DIAGNOSIS — Z6841 Body Mass Index (BMI) 40.0 and over, adult: Secondary | ICD-10-CM

## 2017-12-28 DIAGNOSIS — I10 Essential (primary) hypertension: Secondary | ICD-10-CM

## 2017-12-28 DIAGNOSIS — I428 Other cardiomyopathies: Secondary | ICD-10-CM

## 2017-12-28 DIAGNOSIS — G4733 Obstructive sleep apnea (adult) (pediatric): Secondary | ICD-10-CM

## 2017-12-28 DIAGNOSIS — I456 Pre-excitation syndrome: Secondary | ICD-10-CM

## 2017-12-28 DIAGNOSIS — E1165 Type 2 diabetes mellitus with hyperglycemia: Secondary | ICD-10-CM

## 2017-12-28 MED ORDER — PANTOPRAZOLE SODIUM 20 MG PO TBEC: 20.0000 mg | DELAYED_RELEASE_TABLET | Freq: Every day | ORAL | 1 refills | Status: DC | PRN

## 2017-12-28 MED ORDER — METFORMIN HCL ER 750 MG PO TB24: 1500.0000 mg | ORAL_TABLET | Freq: Every day | ORAL | 3 refills | Status: DC

## 2017-12-28 NOTE — Assessment & Plan Note (Signed)
Recent hospital echo showed EF 20-25%. Now on lasix daily, entresto, and aspirin. Has f/u planned with cardiology next week.

## 2017-12-28 NOTE — Assessment & Plan Note (Addendum)
S/p ablation 12/2017 Now on sotalol.  Appreciate EP/cards care.

## 2017-12-28 NOTE — Assessment & Plan Note (Signed)
Chronic, stable on new regimen of entresto and lasix daily.

## 2017-12-28 NOTE — Assessment & Plan Note (Signed)
Motivated for ongoing efforts at weight loss. Reviewed AHA recommended 139min/week moderate intensity aerobic exercise - discussed slow titration of exercise, and not to start until Saturday (1 wk after ablation).

## 2017-12-28 NOTE — Assessment & Plan Note (Signed)
Pending sleep study Alvin Phillips).

## 2017-12-28 NOTE — Patient Instructions (Addendum)
Check with insurance to see if diabetes classes are affordable and let me know.  Congratulations on healthy changes.  Work on new glucose meter.  Increase metformin ER to 2 tablets in the morning.  Cancel October 25th appt, keep November 15th appt for follow up. Check sugars either fasting before a meal (goal 80-120), or 2 hours after a meal (goal 100-180). Bring log of sugars to next visit.

## 2017-12-28 NOTE — Assessment & Plan Note (Addendum)
Chronic. Pt now motivated for healthy changes - has drastically changed diet, motivated to start walking routine. Discussed insulin vs oral medications and how we may not reach goal with oral medications alone with how high starting A1c is. He wants to start with diet/lifestyle changes and metformin - will increase to 1500mg  metformin ER daily. He will pick up new glucose meter. Reviewed goal range fasting and postprandial cbgs. Discussed DSME classes - will consider in the future. He did meet with nutritionist during his hospitalization. RTC 1 mo f/u visit.  24 hr food diary recall reviewed.

## 2017-12-30 ENCOUNTER — Telehealth: Payer: Self-pay | Admitting: Family Medicine

## 2017-12-30 NOTE — Telephone Encounter (Signed)
plz notify FMLA forms are filled out and ready to pick up. I have written him time to be able to attend medical appointments over the next year.  Plz check with patient when planned return to work date is - and add to question #5. I think reasonable as long as before 01/08/2018.

## 2018-01-01 ENCOUNTER — Ambulatory Visit (INDEPENDENT_AMBULATORY_CARE_PROVIDER_SITE_OTHER): Payer: 59 | Admitting: Nurse Practitioner

## 2018-01-01 ENCOUNTER — Encounter: Payer: Self-pay | Admitting: Nurse Practitioner

## 2018-01-01 VITALS — BP 118/62 | HR 66 | Ht 70.0 in | Wt 358.8 lb

## 2018-01-01 DIAGNOSIS — E119 Type 2 diabetes mellitus without complications: Secondary | ICD-10-CM

## 2018-01-01 DIAGNOSIS — I5022 Chronic systolic (congestive) heart failure: Secondary | ICD-10-CM

## 2018-01-01 DIAGNOSIS — I471 Supraventricular tachycardia: Secondary | ICD-10-CM

## 2018-01-01 DIAGNOSIS — I456 Pre-excitation syndrome: Secondary | ICD-10-CM | POA: Diagnosis not present

## 2018-01-01 DIAGNOSIS — I428 Other cardiomyopathies: Secondary | ICD-10-CM

## 2018-01-01 NOTE — Progress Notes (Signed)
Office Visit    Patient Name: Alvin Phillips Date of Encounter: 01/01/2018  Primary Care Provider:  Eustaquio Boyden, MD Primary Cardiologist:  Yvonne Kendall, MD  Chief Complaint    37 year old male with a history of Wolff-Parkinson-White syndrome, palpitations, hypertension, uncontrolled diabetes mellitus, morbid obesity, GERD, and sleep apnea with CPAP noncompliance, who presents for follow-up after recent hospitalization for acute systolic heart failure and SVT.  Past Medical History    Past Medical History:  Diagnosis Date  . Diabetes mellitus without complication (HCC) 07/10/13   referred to DSME, did not show (04/2014)  . GERD (gastroesophageal reflux disease)   . HFrEF (heart failure with reduced ejection fraction) (HCC)    a. 11/2017 Echo: EF 20-25%, diff HK.  Marland Kitchen Hypertension   . Marijuana abuse   . Morbidly obese (HCC) 03/24/2014  . NICM (nonischemic cardiomyopathy) (HCC)    a. 11/2017 Echo: EF 20-25%, diff HK, mild MR, mildy dil LA, PASP ; b. 11/2017 Cath: nl cors. PCWP . CO/CI 4.8/1.79.  Marland Kitchen OSA (obstructive sleep apnea) 10/29/2013  . Tobacco abuse   . Wolff-Parkinson-White (WPW) syndrome    a. 11/2017 Admitted w/ SVT @ 190 bpm; b. 12/2017 EPS w/RFCA: right mid septal accessory pathway <1cm from AV node. Ablation performed but developed recurrent WPW post-procedure and placed on Sotalol.   Past Surgical History:  Procedure Laterality Date  . FRACTURE SURGERY Left 2002   fractured arm  . HERNIA REPAIR  12-16-13   umbilical  . PVC ABLATION N/A 12/22/2017   Procedure: WPW  ABLATION;  Surgeon: Marinus Maw, MD;  Location: [facility];  Service: Cardiovascular;  Laterality: N/A;  . RIGHT/LEFT HEART CATH AND CORONARY ANGIOGRAPHY N/A 12/18/2017   Procedure: RIGHT/LEFT HEART CATH AND CORONARY ANGIOGRAPHY;  Surgeon: Iran Ouch, MD;  Location: [facility] INVASIVE CV LAB;  Service: Cardiovascular;  Laterality: N/A;    Allergies  No Known  Allergies  History of Present Illness    37 year old male with above past medical history including Wolff-Parkinson-White syndrome, palpitations, hypertension, uncontrolled diabetes mellitus, morbid obesity, GERD, and sleep apnea.  Patient has also been noncompliant previously with antihypertensive therapy and also with cardiology follow-up, as he was advised to follow-up with cardiology in the past in the setting of WPW with palpitations.  He was in his usual state of health about 2 to 3 weeks ago, when he began to experience more frequent palpitations and increasing dyspnea, orthopnea, lower extremity edema, and increasing abdominal girth.  On September 26, had a 2-hour episode of persistent tachycardia and dyspnea with associated fatigue.  He presented to the emergency department where he was found to be in SVT at a rate of 190.  He converted spontaneously and a ECG was notable for delta waves consistent with Wolff-Parkinson-White syndrome.  CT angiogram the chest was negative for PE but did show show CHF and interstitial edema.  He was markedly volume overloaded and subsequently admitted and diuresed.  Echocardiogram showed an EF of 20 to 25% with diffuse hypokinesis.  Diagnostic catheterization which showed normal coronary arteries and elevated right heart pressures.  Cardiac output/index was 4.8/1.79.  After discussion with elective physiology, in the setting of SVT on admission, decision was made to transfer to [facility] for catheter ablation of WPW.  This was performed on October 4, however patient was later noted to have recurrent delta waves and as a result, was placed on sotalol.  Additional ablation was thought to increase his risk of complete  heart block significantly given nearness to the AV node of his accessory pathway.  He received additional IV diuresis and was subsequently discharged home on October 7.    Since his discharge, he has done quite well.  Though his weight is elevated on our  scales today at 358 pounds, he has been trending 350-351 on his home scale which is similar to what he was on October 10 when seen by primary care.  He has noted significant improvement in exercise tolerance and is now weighing himself daily.  He is much more careful with his salt intake and is also interested in losing weight, jotting down all of his calories.  He plans to go for a walk after work today and says he is much more motivated than ever before.  He denies chest pain, dyspnea, palpitations, PND, orthopnea, dizziness, syncope, edema, or early satiety. Home Medications    Prior to Admission medications   Medication Sig Start Date End Date Taking? Authorizing Provider  aspirin EC 81 MG tablet Take 1 tablet (81 mg total) by mouth daily. 12/12/17  Yes Eustaquio Boyden, MD  furosemide (LASIX) 20 MG tablet Take 1 tablet (20 mg total) by mouth daily as needed for fluid or edema. 12/12/17  Yes Eustaquio Boyden, MD  glucose blood (BAYER CONTOUR NEXT TEST) test strip Use as instructed 10/24/16  Yes Eustaquio Boyden, MD  ibuprofen (ADVIL,MOTRIN) 200 MG tablet Take 200 mg by mouth as needed for moderate pain.    Yes [provider]  Lancets Letta Pate ULTRASOFT) lancets Use as instructed 10/24/16  Yes Eustaquio Boyden, MD  metFORMIN (GLUCOPHAGE-XR) 750 MG 24 hr tablet Take 2 tablets (1,500 mg total) by mouth daily with breakfast. 12/28/17  Yes Eustaquio Boyden, MD  pantoprazole (PROTONIX) 20 MG tablet Take 1 tablet (20 mg total) by mouth daily as needed for heartburn or indigestion. 12/28/17  Yes Eustaquio Boyden, MD  sacubitril-valsartan (ENTRESTO) 24-26 MG Take 1 tablet by mouth 2 (two) times daily. 12/25/17  Yes Sheilah Pigeon, PA-C  sotalol (BETAPACE) 160 MG tablet Take 1 tablet (160 mg total) by mouth every 12 (twelve) hours. 12/25/17  Yes Sheilah Pigeon, PA-C    Review of Systems    Doing well since recent hospital station.  He denies chest pain, palpitations, dyspnea, pnd,  orthopnea, n, v, dizziness, syncope, edema, weight gain, or early satiety.  All other systems reviewed and are otherwise negative except as noted above.  Physical Exam    VS:  BP 118/62 (BP Location: Left Arm, Patient Position: Sitting, Cuff Size: Large)   Pulse 66   Ht 5\' 10"  (1.778 m)   Wt (!) 358 lb 12 oz (162.7 kg)   BMI 51.48 kg/m  , BMI Body mass index is 51.48 kg/m. GEN: Obese, in no acute distress. HEENT: normal. Neck: Supple, obese, difficult to gauge JVP, no carotid bruits, or masses. Cardiac: RRR, no murmurs, rubs, or gallops. No clubbing, cyanosis, edema.  Radials/DP/PT 2+ and equal bilaterally.  Respiratory:  Respirations regular and unlabored, clear to auscultation bilaterally. GI: Obese, soft, nontender, nondistended, BS + x 4. MS: no deformity or atrophy. Skin: warm and dry, no rash. Neuro:  Strength and sensation are intact. Psych: Normal affect.  Accessory Clinical Findings    ECG personally reviewed by me today -regular sinus rhythm, 66, left axis deviation, delta waves suggestive of ventricular preexcitation/WPW- no acute changes.  Assessment & Plan    1.  Wolff-Parkinson-White syndrome/PSVT: Recent admission with SVT and a rate of  190 in the setting of prior history of Wolff-Parkinson-White syndrome.  He was also found to have a nonischemic cardiomyopathy.  He was seen by electrophysiology and underwent EP study at [facility] showing a right mid septal accessory pathway which was less than 1 cm from the AV node.  Ablation was initially successful but then he was later noted to have recurrence of WPW morphology.  Given nearness of the accessory pathway to the AV node, repeat ablation was felt to be high risk for complete heart block.  Patient has had no recurrence of palpitations/SVT since discharge.  He remains on sotalol and is tolerating well.  ECG is stable today.  He has follow-up with electrophysiology next month.  2.  Nonischemic cardiomyopathy/chronic  systolic congestive heart failure: Recently found to have LV dysfunction with an EF of 20 to 25% in the setting of poorly controlled hypertension.  He had 25 pound diuresis during hospitalization and though his weight is elevated on our scale today, he says his weight this morning was about 350 pounds on his home scale, which is where he has been since discharge.  Body habitus makes examination challenging though overall, he appears to be euvolemic.  I will follow-up a basic metabolic panel today.  He remains on Lasix 20 mg daily in addition to [place].  He had been on carvedilol which was switched out to sotalol in the setting of WPW.  He was also on Spironolactone but this was discontinued during hospitalization due to soft blood pressures.  Plan to follow-up in echocardiogram in about a month to reevaluate LV function.  Blood pressure much better controlled.  We discussed the importance of daily weights, sodium restriction, medication compliance, and symptom reporting and he verbalizes understanding.   3.  Type II diabetes mellitus: A1c was 12.4 during recent hospitalization.  He is now on metformin and will follow-up with primary care.  4.  Essential hypertension: Stable on Entresto and Lasix.  As above, previously on Spironolactone but discontinued secondary to soft blood pressures during hospitalization.  Following up basic metabolic panel today and will consider resumption.  5.  Morbid obesity: Patient is now interested in losing weight and is watching his calories more closely.  He is also planning on to start a walking program.  6.  Tobacco and marijuana abuse: Complete cessation advised.  7.  Disposition: Follow-up basic metabolic panel today.  Patient has follow-up with electrophysiology next month.  We will plan to see him back in clinic in about 6 weeks following echocardiogram.  Nicolasa Ducking, NP 01/01/2018, 5:35 PM

## 2018-01-01 NOTE — Patient Instructions (Signed)
Medication Instructions:  - Your physician recommends that you continue on your current medications as directed. Please refer to the Current Medication list given to you today.  If you need a refill on your cardiac medications before your next appointment, please call your pharmacy.   Lab work: - Your physician recommends that you have lab work today: BMP  If you have labs (blood work) drawn today and your tests are completely normal, you will receive your results only by: Marland Kitchen MyChart Message (if you have MyChart) OR . A paper copy in the mail If you have any lab test that is abnormal or we need to change your treatment, we will call you to review the results.  Testing/Procedures: - Your physician has requested that you have an echocardiogram in 1 month. Echocardiography is a painless test that uses sound waves to create images of your heart. It provides your doctor with information about the size and shape of your heart and how well your heart's chambers and valves are working. This procedure takes approximately one hour. There are no restrictions for this procedure.  Follow-Up: At [facility], you and your health needs are our priority.  As part of our continuing mission to provide you with exceptional heart care, we have created designated Provider Care Teams.  These Care Teams include your primary Cardiologist (physician) and Advanced Practice Providers (APPs -  Physician Assistants and Nurse Practitioners) who all work together to provide you with the care you need, when you need it. You will need a follow up appointment in 6 weeks.  You may see Yvonne Kendall, MD or one of the following Advanced Practice Providers on your designated Care Team:   Nicolasa Ducking, NP Eula Listen, PA-C . Marisue Ivan, PA-C   - We will reschedule your appointment with Clarisa Kindred, NP in the CHF clinic for 2 weeks out  Any Other Special Instructions Will Be Listed Below (If Applicable). - N/A

## 2018-01-01 NOTE — Telephone Encounter (Addendum)
Attempted to contact pt. No answer. Vm has not been set up yet.   Need to know what pt's "return-to-work" date is to be able to complete #5 on form.  Per Dr. Reece Agar, it needs to be before 01/08/2018.   [Form is in basket on [employer] pending requested date.]

## 2018-01-02 LAB — BASIC METABOLIC PANEL
BUN/Creatinine Ratio: 17 (ref 9–20)
BUN: 16 mg/dL (ref 6–20)
CO2: 22 mmol/L (ref 20–29)
Calcium: 9.3 mg/dL (ref 8.7–10.2)
Chloride: 103 mmol/L (ref 96–106)
Creatinine, Ser: 0.95 mg/dL (ref 0.76–1.27)
GFR calc Af Amer: 117 mL/min/{1.73_m2} (ref >59)
GFR, EST NON AFRICAN AMERICAN: 101 mL/min/{1.73_m2} (ref >59)
Glucose: 122 mg/dL — ABNORMAL HIGH (ref 65–99)
POTASSIUM: 4.5 mmol/L (ref 3.5–5.2)
SODIUM: 142 mmol/L (ref 134–144)

## 2018-01-03 NOTE — Telephone Encounter (Addendum)
Spoke with pt, states he returned to work 01/01/18.  Documented this info on form.  Notified pt form is ready to pick up.  [Placed form at front office.  Made copy to be scanned.]

## 2018-01-04 ENCOUNTER — Ambulatory Visit: Payer: 59 | Admitting: Family

## 2018-01-09 ENCOUNTER — Encounter: Payer: Self-pay | Admitting: Internal Medicine

## 2018-01-11 ENCOUNTER — Ambulatory Visit: Payer: 59 | Admitting: Internal Medicine

## 2018-01-12 ENCOUNTER — Ambulatory Visit: Payer: 59 | Admitting: Family Medicine

## 2018-01-15 ENCOUNTER — Telehealth: Payer: Self-pay | Admitting: Family Medicine

## 2018-01-15 NOTE — Telephone Encounter (Signed)
Pt dropped of fmla paperwork  He stated the paperwork says he can only work 4 hours per day 1-2 days per week from 11/2017 to 11/2018  Pt wanted to know if this could be corrected.  Left message asking August in HR to call me to clarify

## 2018-01-16 ENCOUNTER — Ambulatory Visit: Payer: 59 | Admitting: Physician Assistant

## 2018-01-16 NOTE — Progress Notes (Signed)
Patient ID: Alvin Phillips, male    DOB: 1980-09-27, 37 y.o.   MRN: [id number]  HPI  [name] is a 37 y/o male with a history of DM, HTN, obstructive sleep apnea, WPW, current marijuana use, previous tobacco use and chronic heart failure.   Echo report from 12/15/17 reviewed and showed an EF of 20-25% along with mild MR and a mildly elevated PA pressure of 36 mm Hg.   Cardiac catheterization done 12/18/17 and showed:  1.  Normal coronary arteries. 2.  Severely reduced LV systolic function by echo.  Left ventricular angiography was not performed. 3.  Right heart catheterization showed severely elevated filling pressures with pulmonary capillary wedge pressure of 33 mmHg, moderate to severe pulmonary hypertension and moderately reduced cardiac output at 4.8 with a cardiac index of 1.79.  Admitted 12/21/17 due to SVT. Spontaneously converted. Chest CT angiogram negative for PE but did show HF. Aggressively diuresed with IV lasix with net loss of 10.7L. Transferred to [facility] for EPS/ablation of his WPW which was done 12/22/17. Medications adjusted and he was discharged after 4 days. Admitted 12/14/17 due to tachycardia and HF. Initially given IV lasix. Cardiology consult obtained and he was transferred to [facility] after 7 days for further EP work-up.   He presents today for his initial visit with a chief complaint of intermittent palpitations. He describes these as having been present for several years. He has associated chest pain, rhinorrhea and leg pain along with this. He denies fatigue, cough, shortness of breath, pedal edema, abdominal distention, dizziness, difficulty sleeping or weight gain. Admits that he's not getting much exercise.   Past Medical History:  Diagnosis Date  . CHF (congestive heart failure) (HCC)   . Diabetes mellitus without complication (HCC) 07/10/13   referred to DSME, did not show (04/2014)  . GERD (gastroesophageal reflux disease)   . HFrEF (heart failure with reduced  ejection fraction) (HCC)    a. 11/2017 Echo: EF 20-25%, diff HK.  Marland Kitchen Hypertension   . Marijuana abuse   . Morbidly obese (HCC) 03/24/2014  . NICM (nonischemic cardiomyopathy) (HCC)    a. 11/2017 Echo: EF 20-25%, diff HK, mild MR, mildy dil LA, PASP ; b. 11/2017 Cath: nl cors. PCWP . CO/CI 4.8/1.79.  Marland Kitchen OSA (obstructive sleep apnea) 10/29/2013  . Tobacco abuse   . Wolff-Parkinson-White (WPW) syndrome    a. 11/2017 Admitted w/ SVT @ 190 bpm; b. 12/2017 EPS w/RFCA: right mid septal accessory pathway <1cm from AV node. Ablation performed but developed recurrent WPW post-procedure and placed on Sotalol.   Past Surgical History:  Procedure Laterality Date  . FRACTURE SURGERY Left 2002   fractured arm  . HERNIA REPAIR  12-16-13   umbilical  . PVC ABLATION N/A 12/22/2017   Procedure: WPW  ABLATION;  Surgeon: Marinus Maw, MD;  Location: [facility] INVASIVE CV LAB;  Service: Cardiovascular;  Laterality: N/A;  . RIGHT/LEFT HEART CATH AND CORONARY ANGIOGRAPHY N/A 12/18/2017   Procedure: RIGHT/LEFT HEART CATH AND CORONARY ANGIOGRAPHY;  Surgeon: Iran Ouch, MD;  Location: [facility] INVASIVE CV LAB;  Service: Cardiovascular;  Laterality: N/A;   Family History  Problem Relation Age of Onset  . Diabetes Maternal Grandmother   . Heart attack Maternal Grandmother 80  . Diabetes Paternal Grandfather    Social History   Tobacco Use  . Smoking status: Former Smoker    Packs/day: 0.25    Years: 15.00    Pack years: 3.75    Types: Cigarettes  Last attempt to quit: 2019    Years since quitting: 0.8  . Smokeless tobacco: Never Used  Substance Use Topics  . Alcohol use: Yes    Alcohol/week: 0.0 standard drinks    Comment: Occassional beer, shots of liquor   No Known Allergies Prior to Admission medications   Medication Sig Start Date End Date Taking? Authorizing Provider  aspirin EC 81 MG tablet Take 1 tablet (81 mg total) by mouth daily. 12/12/17  Yes Eustaquio Boyden, MD  furosemide (LASIX)  20 MG tablet Take 1 tablet (20 mg total) by mouth daily as needed for fluid or edema. 12/12/17  Yes Eustaquio Boyden, MD  glucose blood (BAYER CONTOUR NEXT TEST) test strip Use as instructed 10/24/16  Yes Eustaquio Boyden, MD  ibuprofen (ADVIL,MOTRIN) 200 MG tablet Take 200 mg by mouth as needed for moderate pain.    Yes [provider]  Lancets Letta Pate ULTRASOFT) lancets Use as instructed 10/24/16  Yes Eustaquio Boyden, MD  metFORMIN (GLUCOPHAGE-XR) 750 MG 24 hr tablet Take 2 tablets (1,500 mg total) by mouth daily with breakfast. 12/28/17  Yes Eustaquio Boyden, MD  pantoprazole (PROTONIX) 20 MG tablet Take 1 tablet (20 mg total) by mouth daily as needed for heartburn or indigestion. 12/28/17  Yes Eustaquio Boyden, MD  sacubitril-valsartan (ENTRESTO) 24-26 MG Take 1 tablet by mouth 2 (two) times daily. 12/25/17  Yes Sheilah Pigeon, PA-C  sotalol (BETAPACE) 160 MG tablet Take 1 tablet (160 mg total) by mouth every 12 (twelve) hours. 12/25/17  Yes Sheilah Pigeon, PA-C    Review of Systems  Constitutional: Negative for appetite change and fatigue.  HENT: Positive for rhinorrhea. Negative for congestion and sore throat.   Eyes: Negative.   Respiratory: Negative for chest tightness and shortness of breath.   Cardiovascular: Positive for chest pain (at times) and palpitations (at times). Negative for leg swelling.  Gastrointestinal: Negative for abdominal distention and abdominal pain.  Endocrine: Negative.   Genitourinary: Negative.   Musculoskeletal: Positive for myalgias (burning lateral parts of thighs after standing for long periods of time). Negative for back pain and neck pain.  Skin: Negative.   Allergic/Immunologic: Negative.   Neurological: Negative for dizziness and light-headedness.  Hematological: Negative for adenopathy. Does not bruise/bleed easily.  Psychiatric/Behavioral: Negative for dysphoric mood and sleep disturbance (sleeping on 1 pillow). The patient is not  nervous/anxious.     Vitals:   01/17/18 0924  BP: 124/73  Pulse: 75  Resp: 18  SpO2: 99%  Weight: (!) 368 lb 8 oz (167.2 kg)  Height: 5\' 10"  (1.778 m)   Wt Readings from Last 3 Encounters:  01/17/18 (!) 368 lb 8 oz (167.2 kg)  01/01/18 (!) 358 lb 12 oz (162.7 kg)  12/28/17 (!) 351 lb (159.2 kg)   Lab Results  Component Value Date   CREATININE 0.95 01/01/2018   CREATININE 1.08 12/25/2017   CREATININE 1.04 12/24/2017    Physical Exam  Constitutional: He is oriented to person, place, and time. He appears well-developed and well-nourished.  HENT:  Head: Normocephalic and atraumatic.  Neck: Normal range of motion. Neck supple. No JVD present.  Cardiovascular: Normal rate and regular rhythm.  Pulmonary/Chest: Effort normal. He has no wheezes. He has no rales.  Abdominal: Soft. He exhibits no distension. There is no tenderness.  Musculoskeletal: He exhibits no edema or tenderness.  Neurological: He is alert and oriented to person, place, and time.  Skin: Skin is warm and dry.  Psychiatric: He has a normal mood  and affect. His behavior is normal. Thought content normal.  Nursing note and vitals reviewed.   Assessment & Plan:  1: Chronic heart failure with reduced ejection fraction- - NYHA class I - euvolemic today although body habitus makes it difficult to accurately assess this - weighing daily and he says that he's had a gradual weight gain and weighed 360 pounds at home. Instructed to call for an overnight weight gain of >2 pounds or a weekly weight gain of >5 pounds - weight up 10 pounds from cardiology visit 2 weeks ago but he doesn't appear volume overloaded - not adding salt and has been trying to eat low sodium foods. Used to eat at [facility] often but now doesn't eat from there. Reviewed the importance of reading food labels and written dietary information, including a cookbook, was given to him.  - admits that he's not getting much exercise because he doesn't like  walking at dusk due to bats in his neighborhood. Discussed if he could walk at lunch and he says that he can do that and he hadn't thought about walking at lunch - spironolactone has been used in the past which resulted in hypotension - saw cardiology Brion Aliment) 01/01/18 - BNP 12/14/17 was 234.0  2: HTN- - BP looks good today - saw PCP Sharen Hones) 12/28/17 - BMP 01/01/18 reviewed and showed sodium 142, potassium 4.5, creatinine 0.95 and GFR 117  3: DM- - A1c 12/16/17 was 12.4%  4: Obstructive sleep apnea- - saw pulmonologist Nicholos Johns) 12/13/17 - not wearing CPAP; says that he sleeps "great" and wakes up feeling refreshed  5: WPW- - has follow-up with EP next month Ladona Ridgel)  Patient did not bring his medications nor a list. Each medication was verbally reviewed with the patient and he was encouraged to bring the bottles to every visit to confirm accuracy of list.  Return in 2 months or sooner for any questions/problems before then.

## 2018-01-16 NOTE — Telephone Encounter (Signed)
Left message asking august to call the office

## 2018-01-17 ENCOUNTER — Encounter: Payer: Self-pay | Admitting: Family

## 2018-01-17 ENCOUNTER — Ambulatory Visit: Payer: 59 | Attending: Family | Admitting: Family

## 2018-01-17 VITALS — BP 124/73 | HR 75 | Resp 18 | Ht 70.0 in | Wt 368.5 lb

## 2018-01-17 DIAGNOSIS — Z87891 Personal history of nicotine dependence: Secondary | ICD-10-CM | POA: Insufficient documentation

## 2018-01-17 DIAGNOSIS — Z9889 Other specified postprocedural states: Secondary | ICD-10-CM | POA: Diagnosis not present

## 2018-01-17 DIAGNOSIS — Z7982 Long term (current) use of aspirin: Secondary | ICD-10-CM | POA: Diagnosis not present

## 2018-01-17 DIAGNOSIS — F1290 Cannabis use, unspecified, uncomplicated: Secondary | ICD-10-CM | POA: Insufficient documentation

## 2018-01-17 DIAGNOSIS — I471 Supraventricular tachycardia: Secondary | ICD-10-CM | POA: Diagnosis not present

## 2018-01-17 DIAGNOSIS — R079 Chest pain, unspecified: Secondary | ICD-10-CM | POA: Diagnosis not present

## 2018-01-17 DIAGNOSIS — Z8249 Family history of ischemic heart disease and other diseases of the circulatory system: Secondary | ICD-10-CM | POA: Insufficient documentation

## 2018-01-17 DIAGNOSIS — J3489 Other specified disorders of nose and nasal sinuses: Secondary | ICD-10-CM | POA: Insufficient documentation

## 2018-01-17 DIAGNOSIS — I110 Hypertensive heart disease with heart failure: Secondary | ICD-10-CM | POA: Diagnosis present

## 2018-01-17 DIAGNOSIS — K219 Gastro-esophageal reflux disease without esophagitis: Secondary | ICD-10-CM | POA: Insufficient documentation

## 2018-01-17 DIAGNOSIS — R002 Palpitations: Secondary | ICD-10-CM | POA: Diagnosis not present

## 2018-01-17 DIAGNOSIS — Z7984 Long term (current) use of oral hypoglycemic drugs: Secondary | ICD-10-CM | POA: Diagnosis not present

## 2018-01-17 DIAGNOSIS — I952 Hypotension due to drugs: Secondary | ICD-10-CM | POA: Diagnosis not present

## 2018-01-17 DIAGNOSIS — G4733 Obstructive sleep apnea (adult) (pediatric): Secondary | ICD-10-CM | POA: Insufficient documentation

## 2018-01-17 DIAGNOSIS — E118 Type 2 diabetes mellitus with unspecified complications: Secondary | ICD-10-CM

## 2018-01-17 DIAGNOSIS — I456 Pre-excitation syndrome: Secondary | ICD-10-CM | POA: Diagnosis not present

## 2018-01-17 DIAGNOSIS — E1165 Type 2 diabetes mellitus with hyperglycemia: Secondary | ICD-10-CM

## 2018-01-17 DIAGNOSIS — IMO0002 Reserved for concepts with insufficient information to code with codable children: Secondary | ICD-10-CM

## 2018-01-17 DIAGNOSIS — E119 Type 2 diabetes mellitus without complications: Secondary | ICD-10-CM | POA: Insufficient documentation

## 2018-01-17 DIAGNOSIS — Z79899 Other long term (current) drug therapy: Secondary | ICD-10-CM | POA: Insufficient documentation

## 2018-01-17 DIAGNOSIS — M79606 Pain in leg, unspecified: Secondary | ICD-10-CM | POA: Diagnosis not present

## 2018-01-17 DIAGNOSIS — I5022 Chronic systolic (congestive) heart failure: Secondary | ICD-10-CM | POA: Diagnosis present

## 2018-01-17 DIAGNOSIS — I10 Essential (primary) hypertension: Secondary | ICD-10-CM

## 2018-01-17 NOTE — Telephone Encounter (Signed)
Pt aware °Copy for scan °Copy for pt °

## 2018-01-17 NOTE — Telephone Encounter (Signed)
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 She stated  **On question 6 the estimated treatment schedule needed to be blank.  The way they read it was the patient could only work 4 hours 1-2 days a week.    **The wanted estimated treatment time put on question 7  At the frequency spot.  I changed this please initial and date both places  Paperwork in dr g in box

## 2018-01-17 NOTE — Telephone Encounter (Signed)
Paperwork faxed °

## 2018-01-17 NOTE — Patient Instructions (Signed)
Continue weighing daily and call for an overnight weight gain of > 2 pounds or a weekly weight gain of >5 pounds. 

## 2018-01-17 NOTE — Telephone Encounter (Signed)
Filled and in my outbox

## 2018-01-18 ENCOUNTER — Encounter: Payer: Self-pay | Admitting: Family

## 2018-01-26 ENCOUNTER — Ambulatory Visit: Payer: 59 | Admitting: Internal Medicine

## 2018-01-29 ENCOUNTER — Telehealth: Payer: Self-pay | Admitting: *Deleted

## 2018-01-29 ENCOUNTER — Encounter: Payer: Self-pay | Admitting: Internal Medicine

## 2018-01-29 ENCOUNTER — Other Ambulatory Visit: Payer: Self-pay | Admitting: Physician Assistant

## 2018-01-29 NOTE — Telephone Encounter (Signed)
Spoke with pt relaying Dr. G's message. Pt verbalizes understanding.  

## 2018-01-29 NOTE — Telephone Encounter (Signed)
Patient called stating that he started with cold symptoms Saturday. Patient stated that he has had chills, fever,, productive cough-white/yellow. Patient stated that he was using Mucinex, Dayquil and Nyquil, but stopped using that because of the warnings on the boxes. Patient stated that he has a bad cough and his ribs hurt if he coughs real deep. Offered patient an appointment which he declined stating at this point he would just like to know what Dr. Sharen Hones would recommend OTC.  Patient stated that he has an upcoming appointment soon, but if he gets worse he will call back for an earlier appointment.

## 2018-01-29 NOTE — Telephone Encounter (Signed)
May take OTC robitussin or delsym for cough - but would verify they don't have added sugar in them.  May take corcedin brand cold medicines as well (ok with high blood pressures).  Also recommend lots of water and plenty of rest. Agree with office visit if not improving.

## 2018-01-31 NOTE — Telephone Encounter (Signed)
Please advise if ok to refill medication for 90 days.

## 2018-02-01 NOTE — Telephone Encounter (Signed)
Ok to refill for 90 day supply 

## 2018-02-01 NOTE — Progress Notes (Signed)
BP 120/80 (BP Location: Left Arm, Patient Position: Sitting, Cuff Size: Large)   Pulse 65   Temp 97.8 F (36.6 C) (Oral)   Ht 5\' 9"  (1.753 m)   Wt (!) 356 lb 8 oz (161.7 kg)   SpO2 97%   BMI 52.65 kg/m    CC: 3 mo f/u visit Subjective:    Patient ID: Alvin Phillips, male    DOB: [birth year], 37 y.o.   MRN: [id number]  HPI: Alvin Phillips is a 37 y.o. male presenting on 02/02/2018 for Diabetes (Here for 3 mo f/u.)   Recent URI slowly getting better. Still feels chest congestion with productive cough. Some wheezing. No dyspnea or fevers.  Up for a promotion at work.  Missed EP appt last week - forgot about it. Has not called to reschedule. Followed for WPW.   DM - does regularly check sugars twice a week but didn't bring a log. Compliant with antihyperglycemic regimen which includes: metformin XR 1500mg  daily without diarrhea. Denies low sugars or hypoglycemic symptoms. Denies paresthesias. Last diabetic eye exam due. Pneumovax: declines. Prevnar: not due. Glucometer brand: contour next. DSME: states he got this during hospitalization. Lab Results  Component Value Date   HGBA1C 12.4 (H) 12/16/2017   Diabetic Foot Exam - Simple   No data filed     Lab Results  Component Value Date   MICROALBUR 29.6 (H) 02/24/2016     Relevant past medical, surgical, family and social history reviewed and updated as indicated. Interim medical history since our last visit reviewed. Allergies and medications reviewed and updated. Outpatient Medications Prior to Visit  Medication Sig Dispense Refill  . aspirin EC 81 MG tablet Take 1 tablet (81 mg total) by mouth daily.    Marland Kitchen ENTRESTO 24-26 MG TAKE 1 TABLET BY MOUTH TWICE DAILY 180 tablet 2  . glucose blood (BAYER CONTOUR NEXT TEST) test strip Use as instructed 100 each 5  . ibuprofen (ADVIL,MOTRIN) 200 MG tablet Take 200 mg by mouth as needed for moderate pain.     . Lancets (ONETOUCH ULTRASOFT) lancets Use as instructed 100 each 5  . pantoprazole  (PROTONIX) 20 MG tablet Take 1 tablet (20 mg total) by mouth daily as needed for heartburn or indigestion. 30 tablet 1  . sotalol (BETAPACE) 160 MG tablet Take 1 tablet (160 mg total) by mouth every 12 (twelve) hours. 60 tablet 6  . furosemide (LASIX) 20 MG tablet Take 1 tablet (20 mg total) by mouth daily as needed for fluid or edema. 30 tablet 1  . metFORMIN (GLUCOPHAGE-XR) 750 MG 24 hr tablet Take 2 tablets (1,500 mg total) by mouth daily with breakfast. 180 tablet 3   No facility-administered medications prior to visit.      Per HPI unless specifically indicated in ROS section below Review of Systems     Objective:    BP 120/80 (BP Location: Left Arm, Patient Position: Sitting, Cuff Size: Large)   Pulse 65   Temp 97.8 F (36.6 C) (Oral)   Ht 5\' 9"  (1.753 m)   Wt (!) 356 lb 8 oz (161.7 kg)   SpO2 97%   BMI 52.65 kg/m   Wt Readings from Last 3 Encounters:  02/02/18 (!) 356 lb 8 oz (161.7 kg)  01/17/18 (!) 368 lb 8 oz (167.2 kg)  01/01/18 (!) 358 lb 12 oz (162.7 kg)    Physical Exam  Constitutional: He appears well-developed and well-nourished. No distress.  HENT:  Head: Normocephalic and atraumatic.  Right Ear: External ear normal.  Left Ear: External ear normal.  Nose: Nose normal.  Mouth/Throat: Oropharynx is clear and moist. No oropharyngeal exudate.  Eyes: Pupils are equal, round, and reactive to light. Conjunctivae and EOM are normal. No scleral icterus.  Neck: Normal range of motion. Neck supple.  Cardiovascular: Normal rate, regular rhythm, normal heart sounds and intact distal pulses.  No murmur heard. Pulmonary/Chest: Effort normal. No respiratory distress. He has decreased breath sounds. He has no wheezes. He has rhonchi (faint bibasilar). He has no rales.  Coarse breath sounds  Musculoskeletal: He exhibits no edema.  See HPI for foot exam if done  Lymphadenopathy:    He has no cervical adenopathy.  Skin: Skin is warm and dry. No rash noted.  Psychiatric: He  has a normal mood and affect.  Nursing note and vitals reviewed.  Results for orders placed or performed in visit on 01/01/18  Basic metabolic panel  Result Value Ref Range   Glucose 122 (H) 65 - 99 mg/dL   BUN 16 6 - 20 mg/dL   Creatinine, Ser 1.76 0.76 - 1.27 mg/dL   GFR calc non Af Amer 101 >59 mL/min/1.73   GFR calc Af Amer 117 >59 mL/min/1.73   BUN/Creatinine Ratio 17 9 - 20   Sodium 142 134 - 144 mmol/L   Potassium 4.5 3.5 - 5.2 mmol/L   Chloride 103 96 - 106 mmol/L   CO2 22 20 - 29 mmol/L   Calcium 9.3 8.7 - 10.2 mg/dL      Assessment & Plan:   Problem Phillips Items Addressed This Visit    WPW (Wolff-Parkinson-White syndrome)    S/p ablation, now on sotalol.  Advised he call and schedule f/u with EP.       Relevant Medications   furosemide (LASIX) 20 MG tablet   Diabetes mellitus type 2, uncontrolled, with complications (HCC) - Primary    Chronic, pt forgot to keep sugar log. Compliant with metformin XR 1500mg  daily. Check fructosamine today, titrate meds accordingly. He remains hesitant for any additional therapies.      Relevant Medications   metFORMIN (GLUCOPHAGE-XR) 750 MG 24 hr tablet   Other Relevant Orders   Fructosamine   Acute respiratory infection    Continued slow improvement. Anticipate recovery. Reviewed red flags to update [place] or seek further care (ie fever >101 worsening productive cough or not improving as expected).           Meds ordered this encounter  Medications  . furosemide (LASIX) 20 MG tablet    Sig: Take 1 tablet (20 mg total) by mouth daily.    Dispense:  90 tablet    Refill:  3  . metFORMIN (GLUCOPHAGE-XR) 750 MG 24 hr tablet    Sig: Take 2 tablets (1,500 mg total) by mouth daily with breakfast.    Dispense:  180 tablet    Refill:  3   Orders Placed This Encounter  Procedures  . Fructosamine    Follow up plan: Return in about 6 weeks (around 03/16/2018) for follow up visit.  Eustaquio Boyden, MD

## 2018-02-02 ENCOUNTER — Ambulatory Visit (INDEPENDENT_AMBULATORY_CARE_PROVIDER_SITE_OTHER): Payer: 59 | Admitting: Family Medicine

## 2018-02-02 ENCOUNTER — Encounter: Payer: Self-pay | Admitting: Family Medicine

## 2018-02-02 VITALS — BP 120/80 | HR 65 | Temp 97.8°F | Ht 69.0 in | Wt 356.5 lb

## 2018-02-02 DIAGNOSIS — J22 Unspecified acute lower respiratory infection: Secondary | ICD-10-CM

## 2018-02-02 DIAGNOSIS — I456 Pre-excitation syndrome: Secondary | ICD-10-CM

## 2018-02-02 DIAGNOSIS — E118 Type 2 diabetes mellitus with unspecified complications: Secondary | ICD-10-CM | POA: Diagnosis not present

## 2018-02-02 DIAGNOSIS — E1165 Type 2 diabetes mellitus with hyperglycemia: Secondary | ICD-10-CM

## 2018-02-02 DIAGNOSIS — IMO0002 Reserved for concepts with insufficient information to code with codable children: Secondary | ICD-10-CM

## 2018-02-02 MED ORDER — FUROSEMIDE 20 MG PO TABS: 20.0000 mg | ORAL_TABLET | Freq: Every day | ORAL | 3 refills | Status: DC

## 2018-02-02 MED ORDER — METFORMIN HCL ER 750 MG PO TB24: 1500.0000 mg | ORAL_TABLET | Freq: Every day | ORAL | 3 refills | Status: DC

## 2018-02-02 NOTE — Assessment & Plan Note (Signed)
S/p ablation, now on sotalol.  Advised he call and schedule f/u with EP.

## 2018-02-02 NOTE — Assessment & Plan Note (Signed)
Chronic, pt forgot to keep sugar log. Compliant with metformin XR 1500mg  daily. Check fructosamine today, titrate meds accordingly. He remains hesitant for any additional therapies.

## 2018-02-02 NOTE — Patient Instructions (Addendum)
Call Dr Lubertha Basque office to reschedule appointment.  Labs today (fructosamine).  Schedule eye exam as you're due.  Return in 6 weeks for follow up visit.

## 2018-02-02 NOTE — Assessment & Plan Note (Signed)
Continued slow improvement. Anticipate recovery. Reviewed red flags to update [place] or seek further care (ie fever >101 worsening productive cough or not improving as expected).

## 2018-02-05 ENCOUNTER — Encounter: Payer: Self-pay | Admitting: Internal Medicine

## 2018-02-05 LAB — FRUCTOSAMINE: FRUCTOSAMINE: 275 umol/L (ref 205–285)

## 2018-02-06 ENCOUNTER — Other Ambulatory Visit: Payer: 59

## 2018-02-09 ENCOUNTER — Telehealth: Payer: Self-pay

## 2018-02-09 NOTE — Telephone Encounter (Signed)
Attempted to contact pt to relay lab results.  No answer.  No vm.  Mailed results to the pt. [See Result Note, Labs, 02/02/18.]  Need to relay results, per Dr. Reece Agar, if pt calls back.   Results: Your fructosamine returned with an A1c equivalent of ~6.8% which was a marked improvement! Continue metformin and healthy diet and lifestyle and we will recheck A1c in December.

## 2018-02-21 ENCOUNTER — Ambulatory Visit: Payer: 59 | Admitting: Internal Medicine

## 2018-02-22 ENCOUNTER — Other Ambulatory Visit: Payer: 59

## 2018-02-22 ENCOUNTER — Other Ambulatory Visit: Payer: Self-pay

## 2018-02-22 ENCOUNTER — Ambulatory Visit (INDEPENDENT_AMBULATORY_CARE_PROVIDER_SITE_OTHER): Payer: 59

## 2018-02-22 DIAGNOSIS — I428 Other cardiomyopathies: Secondary | ICD-10-CM | POA: Diagnosis not present

## 2018-02-26 ENCOUNTER — Telehealth: Payer: Self-pay

## 2018-02-26 NOTE — Telephone Encounter (Signed)
Call to pt to discuss results from ECHO, note from provider was discussed with patient,   "Notes recorded by Creig Hines, NP on 02/26/2018 at 8:26 AM EST Heart squeezing fxn remains reduced at this time - 30-35%(previously 20-25%). F/u with Dr. Ladona Ridgel on 12/11 as planned to discuss next steps."  The patient has been notified of the result and verbalized understanding.  All questions (if any) were answered. Efrain Sella, RN 02/26/2018 9:17 AM

## 2018-02-28 ENCOUNTER — Ambulatory Visit (INDEPENDENT_AMBULATORY_CARE_PROVIDER_SITE_OTHER): Payer: 59 | Admitting: Internal Medicine

## 2018-02-28 ENCOUNTER — Encounter: Payer: Self-pay | Admitting: Internal Medicine

## 2018-02-28 VITALS — BP 132/72 | HR 55 | Ht 69.0 in | Wt 363.0 lb

## 2018-02-28 DIAGNOSIS — I456 Pre-excitation syndrome: Secondary | ICD-10-CM

## 2018-02-28 DIAGNOSIS — N529 Male erectile dysfunction, unspecified: Secondary | ICD-10-CM

## 2018-02-28 MED ORDER — SILDENAFIL CITRATE 50 MG PO TABS: 50.0000 mg | ORAL_TABLET | Freq: Every day | ORAL | 0 refills | Status: DC | PRN

## 2018-02-28 NOTE — Progress Notes (Signed)
HPI Mr. Fetterhoff returns today for followup. He is a pleasant 37 yo morbidly obese man with WpW mapped to less than a cm from the AV node. He had successful ablation but then had return of his AP the day after the ablation. He was placed on sotalol. He has done well in the interim except he c/o ED and an inability to lose weight. He denies chest pain or sob. No edema. No Known Allergies   Current Outpatient Medications  Medication Sig Dispense Refill  . aspirin EC 81 MG tablet Take 1 tablet (81 mg total) by mouth daily.    Marland Kitchen ENTRESTO 24-26 MG TAKE 1 TABLET BY MOUTH TWICE DAILY 180 tablet 2  . furosemide (LASIX) 20 MG tablet Take 1 tablet (20 mg total) by mouth daily. 90 tablet 3  . glucose blood (BAYER CONTOUR NEXT TEST) test strip Use as instructed 100 each 5  . ibuprofen (ADVIL,MOTRIN) 200 MG tablet Take 200 mg by mouth as needed for moderate pain.     . Lancets (ONETOUCH ULTRASOFT) lancets Use as instructed 100 each 5  . metFORMIN (GLUCOPHAGE-XR) 750 MG 24 hr tablet Take 2 tablets (1,500 mg total) by mouth daily with breakfast. 180 tablet 3  . pantoprazole (PROTONIX) 20 MG tablet Take 1 tablet (20 mg total) by mouth daily as needed for heartburn or indigestion. 30 tablet 1  . sotalol (BETAPACE) 160 MG tablet Take 1 tablet (160 mg total) by mouth every 12 (twelve) hours. 60 tablet 6   No current facility-administered medications for this visit.      Past Medical History:  Diagnosis Date  . CHF (congestive heart failure) (HCC)   . Diabetes mellitus without complication (HCC) 07/10/13   referred to DSME, did not show (04/2014)  . GERD (gastroesophageal reflux disease)   . HFrEF (heart failure with reduced ejection fraction) (HCC)    a. 11/2017 Echo: EF 20-25%, diff HK.  Marland Kitchen Hypertension   . Marijuana abuse   . Morbidly obese (HCC) 03/24/2014  . NICM (nonischemic cardiomyopathy) (HCC)    a. 11/2017 Echo: EF 20-25%, diff HK, mild MR, mildy dil LA, PASP ; b. 11/2017 Cath: nl cors.  PCWP . CO/CI 4.8/1.79.  Marland Kitchen OSA (obstructive sleep apnea) 10/29/2013  . Tobacco abuse   . Wolff-Parkinson-White (WPW) syndrome    a. 11/2017 Admitted w/ SVT @ 190 bpm; b. 12/2017 EPS w/RFCA: right mid septal accessory pathway <1cm from AV node. Ablation performed but developed recurrent WPW post-procedure and placed on Sotalol.    ROS:   All systems reviewed and negative except as noted in the HPI.   Past Surgical History:  Procedure Laterality Date  . FRACTURE SURGERY Left 2002   fractured arm  . HERNIA REPAIR  12-16-13   umbilical  . PVC ABLATION N/A 12/22/2017   Procedure: WPW  ABLATION;  Surgeon: Marinus Maw, MD;  Location: [facility];  Service: Cardiovascular;  Laterality: N/A;  . RIGHT/LEFT HEART CATH AND CORONARY ANGIOGRAPHY N/A 12/18/2017   Procedure: RIGHT/LEFT HEART CATH AND CORONARY ANGIOGRAPHY;  Surgeon: Iran Ouch, MD;  Location: [facility] INVASIVE CV LAB;  Service: Cardiovascular;  Laterality: N/A;     Family History  Problem Relation Age of Onset  . Diabetes Maternal Grandmother   . Heart attack Maternal Grandmother 80  . Diabetes Paternal Grandfather      Social History   Socioeconomic History  . Marital status: Single    Spouse name: Not on file  .  Number of children: 2  . Years of education: Not on file  . Highest education level: Not on file  Occupational History  . Occupation: Advertising account executive: SELF-EMPLOYED    Comment: [employer]  Social Needs  . Financial resource strain: Not on file  . Food insecurity:    Worry: Not on file    Inability: Not on file  . Transportation needs:    Medical: Not on file    Non-medical: Not on file  Tobacco Use  . Smoking status: Former Smoker    Packs/day: 0.25    Years: 15.00    Pack years: 3.75    Types: Cigarettes    Last attempt to quit: 2019    Years since quitting: 0.9  . Smokeless tobacco: Never Used  Substance and Sexual Activity  . Alcohol use: Yes     Alcohol/week: 0.0 standard drinks    Comment: Occassional beer, shots of liquor  . Drug use: Yes    Frequency: 2.0 times per week    Types: Marijuana    Comment: 1 time per week; none in 4-6 months.  . Sexual activity: Not on file  Lifestyle  . Physical activity:    Days per week: Not on file    Minutes per session: Not on file  . Stress: Not on file  Relationships  . Social connections:    Talks on phone: Not on file    Gets together: Not on file    Attends religious service: Not on file    Active member of club or organization: Not on file    Attends meetings of clubs or organizations: Not on file    Relationship status: Not on file  . Intimate partner violence:    Fear of current or ex partner: Not on file    Emotionally abused: Not on file    Physically abused: Not on file    Forced sexual activity: Not on file  Other Topics Concern  . Not on file  Social History Narrative   Lives with girlfriend Gumecindo Hopkin grew up in [place]. He is currently living in [place]. [name], [place]). He breeds dogs Medical sales representative).     BP 132/72   Pulse (!) 55   Ht 5\' 9"  (1.753 m)   Wt (!) 363 lb (164.7 kg)   BMI 53.61 kg/m   Physical Exam:  obese appearing 37 yo man, NAD HEENT: Unremarkable Neck:  6 cm JVD, no thyromegally Lymphatics:  No adenopathy Back:  No CVA tenderness Lungs:  Clear with no wheezes HEART:  Regular rate rhythm, no murmurs, no rubs, no clicks Abd:  soft, positive bowel sounds, no organomegally, no rebound, no guarding Ext:  2 plus pulses, no edema, no cyanosis, no clubbing Skin:  No rashes no nodules Neuro:  CN II through XII intact, motor grossly intact  EKG NSR with manifest WpW   Assess/Plan: 1. WPW - he is currently asymptomatic on Sotalol. He will continue his current meds. 2. Chronic systolic heart failure - on medical therapy he is class 1. His EF has improved. He will continue his CHF meds. 3. Obesity - I  strongly encouraged him to lose weight. 4. ED - he was prescribe generic viagra.  Leonia Reeves.D.

## 2018-02-28 NOTE — Patient Instructions (Addendum)
Medication Instructions:  Your physician has recommended you make the following change in your medication:   1.  Take sildenafil 50 mg--Take one tablet by mouth as needed daily for ED.  Labwork: None ordered.  Testing/Procedures: None ordered.  Follow-Up: Your physician wants you to follow-up in: 6 months with Dr. Ladona Ridgel.   You will receive a reminder letter in the mail two months in advance. If you don't receive a letter, please call our office to schedule the follow-up appointment.   Any Other Special Instructions Will Be Listed Below (If Applicable).  If you need a refill on your cardiac medications before your next appointment, please call your pharmacy.

## 2018-03-08 ENCOUNTER — Encounter: Payer: Self-pay | Admitting: Nurse Practitioner

## 2018-03-08 ENCOUNTER — Ambulatory Visit (INDEPENDENT_AMBULATORY_CARE_PROVIDER_SITE_OTHER): Payer: 59 | Admitting: Nurse Practitioner

## 2018-03-08 VITALS — BP 128/82 | HR 51 | Ht 70.0 in | Wt 360.5 lb

## 2018-03-08 DIAGNOSIS — I10 Essential (primary) hypertension: Secondary | ICD-10-CM

## 2018-03-08 DIAGNOSIS — E119 Type 2 diabetes mellitus without complications: Secondary | ICD-10-CM

## 2018-03-08 DIAGNOSIS — I428 Other cardiomyopathies: Secondary | ICD-10-CM

## 2018-03-08 DIAGNOSIS — G4733 Obstructive sleep apnea (adult) (pediatric): Secondary | ICD-10-CM

## 2018-03-08 DIAGNOSIS — I5022 Chronic systolic (congestive) heart failure: Secondary | ICD-10-CM

## 2018-03-08 DIAGNOSIS — I456 Pre-excitation syndrome: Secondary | ICD-10-CM

## 2018-03-08 NOTE — Patient Instructions (Signed)
Medication Instructions:  Your physician recommends that you continue on your current medications as directed. Please refer to the Current Medication list given to you today.  If you need a refill on your cardiac medications before your next appointment, please call your pharmacy.   Lab work: none If you have labs (blood work) drawn today and your tests are completely normal, you will receive your results only by: . MyChart Message (if you have MyChart) OR . A paper copy in the mail If you have any lab test that is abnormal or we need to change your treatment, we will call you to review the results.  Testing/Procedures: none  Follow-Up: At [facility], you and your health needs are our priority.  As part of our continuing mission to provide you with exceptional heart care, we have created designated Provider Care Teams.  These Care Teams include your primary Cardiologist (physician) and Advanced Practice Providers (APPs -  Physician Assistants and Nurse Practitioners) who all work together to provide you with the care you need, when you need it. You will need a follow up appointment in 3 months.  Please call our office 2 months in advance to schedule this appointment.  You may see Christopher End, MD or one of the following Advanced Practice Providers on your designated Care Team:   Christopher Berge, NP Ryan Dunn, PA-C . Jacquelyn Visser, PA-C     

## 2018-03-08 NOTE — Progress Notes (Signed)
Office Visit    Patient Name: Cyndi BenderMario S Saar Date of Encounter: 03/08/2018  Primary Care Provider:  Eustaquio BoydenGutierrez, Javier, MD Primary Cardiologist:  Yvonne Kendallhristopher End, MD EP: Rosette RevealG. Taylor, MD   Chief Complaint    37 year old male with a history of Wolff-Parkinson-White syndrome, palpitations, hypertension, uncontrolled diabetes mellitus, morbid obesity, GERD, and sleep apnea, who presents for follow-up related to chronic systolic heart failure and nonischemic cardiomyopathy.  Past Medical History    Past Medical History:  Diagnosis Date  . CHF (congestive heart failure) (HCC)   . Diabetes mellitus without complication (HCC) 07/10/13   referred to DSME, did not show (04/2014)  . GERD (gastroesophageal reflux disease)   . HFrEF (heart failure with reduced ejection fraction) (HCC)    a. 11/2017 Echo: EF 20-25%, diff HK;  b. 02/2018 Echo: EF 30-35%, diff HK  . Hypertension   . Marijuana abuse   . Morbidly obese (HCC) 03/24/2014  . NICM (nonischemic cardiomyopathy) (HCC)    a. 11/2017 Echo: EF 20-25%, diff HK, mild MR, mildy dil LA, PASP 36mmHg; b. 11/2017 Cath: nl cors. PCWP 33mmHg. CO/CI 4.8/1.79; c. 02/2018 Echo: EF 30-35%, diff HK, mildly dil LA, nl RV fxn. PASP 44mmHg.  . OSA (obstructive sleep apnea) 10/29/2013  . Tobacco abuse   . Wolff-Parkinson-White (WPW) syndrome    a. 11/2017 Admitted w/ SVT @ 190 bpm; b. 12/2017 EPS w/RFCA: right mid septal accessory pathway <1cm from AV node. Ablation performed but developed recurrent WPW post-procedure and placed on Sotalol.   Past Surgical History:  Procedure Laterality Date  . FRACTURE SURGERY Left 2002   fractured arm  . HERNIA REPAIR  12-16-13   umbilical  . PVC ABLATION N/A 12/22/2017   Procedure: WPW  ABLATION;  Surgeon: Marinus Mawaylor, Gregg W, MD;  Location: [facility];  Service: Cardiovascular;  Laterality: N/A;  . RIGHT/LEFT HEART CATH AND CORONARY ANGIOGRAPHY N/A 12/18/2017   Procedure: RIGHT/LEFT HEART CATH AND CORONARY ANGIOGRAPHY;   Surgeon: Iran OuchArida, Muhammad A, MD;  Location: [facility] INVASIVE CV LAB;  Service: Cardiovascular;  Laterality: N/A;    Allergies  No Known Allergies  History of Present Illness    37 year old male with above past medical history including Wolff-Parkinson-White syndrome, palpitations, hypertension, uncontrolled diabetes mellitus, morbid obesity, GERD, and sleep apnea.  He had a prior history of noncompliance with antihypertensive therapy as well as cardiology follow-up in the setting of a prior diagnosis of WPW with palpitations.  He was admitted to [facility] in late September 2019, with complaints of persistent tachycardia and dyspnea.  He was found to be in SVT at a rate of 190.  He converted spontaneously and was noted to have delta wave consistent with Wolff-Parkinson-White syndrome.  CT angiogram of the chest was negative for PE but did show interstitial edema.  Echo showed EF of 20 to 25% with diffuse hypokinesis.  Catheterization showed normal coronary arteries.  [facility] and underwent catheter ablation for his WPW which was initially successful but then he was noted to have recurrent delta waves and was subsequently placed on sotalol.  I last saw him in mid October.  Since then, he has undergone repeat echocardiogram which showed slight improvement in LV function-now 30 to 35%.  He followed up with Dr. Ladona Ridgelaylor on December 11, with recommendation to continue sotalol therapy and heart failure medication management.  Since then, he has continued to do well.  His weight is up-now trending at 355 on his home scale, which  is about 5 pounds higher than he was in October.  He says he has been eating more since Thanksgiving.  He also eats out for lunch on a daily basis.  Despite this, he has not been having any significant lower extremity swelling, dyspnea, PND, orthopnea, or early satiety.  He also denies chest pain or palpitations.  Home Medications    Prior to Admission medications    Medication Sig Start Date End Date Taking? Authorizing Provider  aspirin EC 81 MG tablet Take 1 tablet (81 mg total) by mouth daily. 12/12/17  Yes Eustaquio Boyden, MD  ENTRESTO 24-26 MG TAKE 1 TABLET BY MOUTH TWICE DAILY 02/01/18  Yes End, Cristal Deer, MD  furosemide (LASIX) 20 MG tablet Take 1 tablet (20 mg total) by mouth daily. 02/02/18  Yes Eustaquio Boyden, MD  glucose blood (BAYER CONTOUR NEXT TEST) test strip Use as instructed 10/24/16  Yes Eustaquio Boyden, MD  ibuprofen (ADVIL,MOTRIN) 200 MG tablet Take 200 mg by mouth as needed for moderate pain.    Yes [provider]  Lancets Letta Pate ULTRASOFT) lancets Use as instructed 10/24/16  Yes Eustaquio Boyden, MD  metFORMIN (GLUCOPHAGE-XR) 750 MG 24 hr tablet Take 2 tablets (1,500 mg total) by mouth daily with breakfast. 02/02/18  Yes Eustaquio Boyden, MD  pantoprazole (PROTONIX) 20 MG tablet Take 1 tablet (20 mg total) by mouth daily as needed for heartburn or indigestion. 12/28/17  Yes Eustaquio Boyden, MD  sildenafil (VIAGRA) 50 MG tablet Take 1 tablet (50 mg total) by mouth daily as needed for erectile dysfunction. 02/28/18  Yes Marinus Maw, MD  sotalol (BETAPACE) 160 MG tablet Take 1 tablet (160 mg total) by mouth every 12 (twelve) hours. 12/25/17  Yes Sheilah Pigeon, PA-C    Review of Systems    Weight is up but he says he is been eating more and doing less.  He denies chest pain, palpitations, dyspnea, pnd, orthopnea, n, v, dizziness, syncope, edema, weight gain, or early satiety.  All other systems reviewed and are otherwise negative except as noted above.  Physical Exam    VS:  BP 128/82 (BP Location: Left Arm, Patient Position: Sitting, Cuff Size: Large)   Pulse (!) 51   Ht 5\' 10"  (1.778 m)   Wt (!) 360 lb 8 oz (163.5 kg)   BMI 51.73 kg/m  , BMI Body mass index is 51.73 kg/m. GEN: Obese, in no acute distress. HEENT: normal. Neck: Supple, obese, difficult to gauge JVP, no carotid bruits, or  masses. Cardiac: RRR, no murmurs, rubs, or gallops. No clubbing, cyanosis, edema.  Radials/DP/PT 2+ and equal bilaterally.  Respiratory:  Respirations regular and unlabored, clear to auscultation bilaterally. GI: Soft, nontender, nondistended, BS + x 4. MS: no deformity or atrophy. Skin: warm and dry, no rash. Neuro:  Strength and sensation are intact. Psych: Normal affect.  Accessory Clinical Findings    ECG personally reviewed by me today -sinus bradycardia, 51, delta waves/preexcitation- no acute changes.  Assessment & Plan    1.   HFrEF/nonischemic cardiomyopathy: Recent follow-up echo showed EF of 30 to 35% which was up slightly from prior evaluation.  Patient has gained some weight but attributes this to overeating.  Volume appears to be stable on exam.  He has not been having any symptoms of heart failure.  He remains on Entresto and low-dose Lasix therapy.  We discussed potentially adding spironolactone or titrating Entresto today however, he has not yet taken his medications.  He is not sure what his  blood pressure runs though he did have hypotension with spiro in the past.  I did recommend that he follow his blood pressure at home and contact us with the numbers so that we can make an educated decision with regards to titrating Entresto further.  He is not on carvedilol or metoprolol succinate in the setting of sotalol therapy and bradycardia.  He will continue to have EP follow-up related to WPW and LV dysfunction.  2. WPW: Status post catheter ablation which was successful with subsequent return of delta waves.  No recurrent palpitations/SVT on sotalol therapy.  Recently seen by Dr. Ladona Ridgel with recommendation to continue current regimen.  3.  Essential hypertension: Stable.  4.  Type 2 diabetes mellitus: A1c was 12.4 in September.  He is on metformin and reports compliance.  Followed by primary care.  5.  Obstructive sleep apnea: History of CPAP noncompliance.  Compliance  advised.  6.  Morbid obesity: We discussed the importance of weight loss.  He has been eating more since Thanksgiving and is up on both his home scale and our scale by about 5 pounds.  I advised that we will plan to see him back in 3 months and I am hopeful that he will be down about 10 pounds at that time.  He says he likes challenges and plans to "below that 1 out of the water."  7.  Disposition: Patient will follow-up in 3 months or sooner if necessary.   Nicolasa Ducking, NP 03/08/2018, 3:33 PM

## 2018-03-09 ENCOUNTER — Ambulatory Visit: Payer: 59 | Admitting: Family

## 2018-03-15 ENCOUNTER — Encounter: Payer: Self-pay | Admitting: Family Medicine

## 2018-03-15 NOTE — Progress Notes (Deleted)
There were no vitals taken for this visit.   CC: 6 wk DM f/u visit Subjective:    Patient ID: Alvin Phillips, male    DOB: 1980/08/10, 37 y.o.   MRN: [id number]  HPI: Alvin Phillips is a 37 y.o. male presenting on 03/16/2018 for No chief complaint on file.   Recent ablation for WpW, needed sotalol afterwards. Also has known HFrEF on entresto and lasix. Recent cards and EP notes reviewed.  DM - does *** regularly check sugars ***. Compliant with antihyperglycemic regimen which includes: metformin XR 1500mg  daily. Denies low sugars or hypoglycemic symptoms. Denies paresthesias. Last diabetic eye exam DUE. Pneumovax: declines. Prevnar: not due. Glucometer brand: contour next. DSME: had some teaching during hospitalization. Lab Results  Component Value Date   HGBA1C 12.4 (H) 12/16/2017   Diabetic Foot Exam - Simple   No data filed     Lab Results  Component Value Date   MICROALBUR 29.6 (H) 02/24/2016         Relevant past medical, surgical, family and social history reviewed and updated as indicated. Interim medical history since our last visit reviewed. Allergies and medications reviewed and updated. Outpatient Medications Prior to Visit  Medication Sig Dispense Refill  . aspirin EC 81 MG tablet Take 1 tablet (81 mg total) by mouth daily.    Marland Kitchen ENTRESTO 24-26 MG TAKE 1 TABLET BY MOUTH TWICE DAILY 180 tablet 2  . furosemide (LASIX) 20 MG tablet Take 1 tablet (20 mg total) by mouth daily. 90 tablet 3  . glucose blood (BAYER CONTOUR NEXT TEST) test strip Use as instructed 100 each 5  . ibuprofen (ADVIL,MOTRIN) 200 MG tablet Take 200 mg by mouth as needed for moderate pain.     . Lancets (ONETOUCH ULTRASOFT) lancets Use as instructed 100 each 5  . metFORMIN (GLUCOPHAGE-XR) 750 MG 24 hr tablet Take 2 tablets (1,500 mg total) by mouth daily with breakfast. 180 tablet 3  . pantoprazole (PROTONIX) 20 MG tablet Take 1 tablet (20 mg total) by mouth daily as needed for heartburn or  indigestion. 30 tablet 1  . sildenafil (VIAGRA) 50 MG tablet Take 1 tablet (50 mg total) by mouth daily as needed for erectile dysfunction. 20 tablet 0  . sotalol (BETAPACE) 160 MG tablet Take 1 tablet (160 mg total) by mouth every 12 (twelve) hours. 60 tablet 6   No facility-administered medications prior to visit.      Per HPI unless specifically indicated in ROS section below Review of Systems Objective:    There were no vitals taken for this visit.  Wt Readings from Last 3 Encounters:  03/08/18 (!) 360 lb 8 oz (163.5 kg)  02/28/18 (!) 363 lb (164.7 kg)  02/02/18 (!) 356 lb 8 oz (161.7 kg)    Physical Exam Vitals signs and nursing note reviewed.  Constitutional:      General: He is not in acute distress.    Appearance: He is well-developed.  HENT:     Head: Normocephalic and atraumatic.     Right Ear: External ear normal.     Left Ear: External ear normal.     Nose: Nose normal.     Mouth/Throat:     Pharynx: No oropharyngeal exudate.  Eyes:     General: No scleral icterus.    Conjunctiva/sclera: Conjunctivae normal.     Pupils: Pupils are equal, round, and reactive to light.  Neck:     Musculoskeletal: Normal range of motion and neck supple.  Cardiovascular:     Rate and Rhythm: Normal rate and regular rhythm.     Heart sounds: Normal heart sounds. No murmur.  Pulmonary:     Effort: Pulmonary effort is normal. No respiratory distress.     Breath sounds: Normal breath sounds. No wheezing or rales.  Musculoskeletal:     Comments: See HPI for foot exam if done  Lymphadenopathy:     Cervical: No cervical adenopathy.  Skin:    General: Skin is warm and dry.     Findings: No rash.       Results for orders placed or performed in visit on 02/02/18  Fructosamine  Result Value Ref Range   Fructosamine 275 205 - 285 umol/L   Assessment & Plan:   Problem List Items Addressed This Visit    None       No orders of the defined types were placed in this  encounter.  No orders of the defined types were placed in this encounter.   Follow up plan: No follow-ups on file.  Alvin BoydenJavier Kaytlin Burklow, MD

## 2018-03-16 ENCOUNTER — Ambulatory Visit: Payer: 59 | Admitting: Family Medicine

## 2018-03-16 DIAGNOSIS — Z0289 Encounter for other administrative examinations: Secondary | ICD-10-CM

## 2018-06-11 ENCOUNTER — Telehealth: Payer: Self-pay

## 2018-06-11 NOTE — Telephone Encounter (Signed)

## 2018-06-13 ENCOUNTER — Ambulatory Visit: Payer: 59 | Admitting: Internal Medicine

## 2018-06-13 NOTE — Telephone Encounter (Signed)
Called patient to schedule possible evisit appointment No answer no VM

## 2018-06-18 NOTE — Telephone Encounter (Signed)
Called patient to schedule possible evisit appointment No answer, no VM

## 2018-06-28 NOTE — Telephone Encounter (Signed)
No ans no vm  3 x attempts to schedule.  Recall entered.  Removing from [facility].

## 2018-08-14 ENCOUNTER — Telehealth: Payer: Self-pay | Admitting: Internal Medicine

## 2018-08-14 NOTE — Telephone Encounter (Signed)
New message    Called pt about appt on 06.23.20 with Dr. Ladona Ridgel, need to RS appt. Pt phone said pt is not accepting calls at this time. Will call back later. If pt returns call, please reach out via secure chat. I will speak to pt.

## 2018-09-11 ENCOUNTER — Ambulatory Visit: Payer: 59 | Admitting: Internal Medicine

## 2018-09-24 ENCOUNTER — Telehealth: Payer: Self-pay | Admitting: *Deleted

## 2018-09-24 MED ORDER — METFORMIN HCL 850 MG PO TABS: 850.0000 mg | ORAL_TABLET | Freq: Two times a day (BID) | ORAL | 1 refills | Status: DC

## 2018-09-24 NOTE — Telephone Encounter (Signed)
Spoke with pt relaying Dr. Synthia Innocent message.  Pt verbalizes understanding.  However, he was in a virtual meeting and will have to call back to scheduled DM f/u and lab visit.

## 2018-09-24 NOTE — Telephone Encounter (Signed)
Extended release metformin has been recalled. Will need to switch to immediate release metformin 850mg  BID. I have sent to pharmacy. He is overdue for f/u. Please schedule with labs.

## 2018-09-24 NOTE — Telephone Encounter (Signed)
Patient left a voicemail stating that his metformin has been recalled. Patient stated that the pharmacy told him all they needed to do was sent over a note to this effect. Patient stated that he has been out of Metformin for over a week. Patient requested that a new script be sent to the pharmacy for a replacement. Pharmacy [facility]

## 2018-09-26 ENCOUNTER — Encounter: Payer: Self-pay | Admitting: Family Medicine

## 2018-09-26 ENCOUNTER — Other Ambulatory Visit: Payer: 59

## 2018-09-26 ENCOUNTER — Ambulatory Visit (INDEPENDENT_AMBULATORY_CARE_PROVIDER_SITE_OTHER): Payer: 59 | Admitting: Family Medicine

## 2018-09-26 VITALS — BP 132/84 | HR 78 | Temp 97.6°F | Ht 69.0 in | Wt 380.0 lb

## 2018-09-26 DIAGNOSIS — M25561 Pain in right knee: Secondary | ICD-10-CM

## 2018-09-26 DIAGNOSIS — E1165 Type 2 diabetes mellitus with hyperglycemia: Secondary | ICD-10-CM

## 2018-09-26 DIAGNOSIS — E118 Type 2 diabetes mellitus with unspecified complications: Secondary | ICD-10-CM | POA: Diagnosis not present

## 2018-09-26 DIAGNOSIS — Z6841 Body Mass Index (BMI) 40.0 and over, adult: Secondary | ICD-10-CM

## 2018-09-26 DIAGNOSIS — IMO0002 Reserved for concepts with insufficient information to code with codable children: Secondary | ICD-10-CM

## 2018-09-26 LAB — POCT GLYCOSYLATED HEMOGLOBIN (HGB A1C): Hemoglobin A1C: 9.3 % — AB (ref 4.0–5.6)

## 2018-09-26 MED ORDER — NAPROXEN 500 MG PO TABS: ORAL_TABLET | ORAL | 0 refills | Status: DC

## 2018-09-26 NOTE — Progress Notes (Signed)
Virtual visit completed through [employer]. Due to national recommendations of social distancing due to COVID-19, a virtual visit is felt to be most appropriate for this patient at this time. Reviewed limitations of a virtual visit.   Patient location: home Provider location:  at [facility], office If any vitals were documented, they were collected by patient at home unless specified below.    BP 132/84 (BP Location: Left Arm, Patient Position: Sitting, Cuff Size: Large)   Pulse 78   Temp 97.6 F (36.4 C) (Tympanic)   Ht 5\' 9"  (1.753 m)   Wt (!) 380 lb (172.4 kg)   BMI 56.12 kg/m    CC: DM f/u, R knee pain Subjective:    Patient ID: Alvin Phillips, male    DOB: [birth year], 38 y.o.   MRN: [id number]  HPI: Alvin Phillips is a 38 y.o. male presenting on 09/26/2018 for Diabetes (F/u.) and Knee Pain (C/o right knee pain and swelling. )   Working from home last few months. Poor diet over the past month due to this.   DM - does not regularly check sugars. Compliant with antihyperglycemic regimen which includes: metformin 850mg  BID (recent switch from XR metformin due to national recall). Was off metformin for the past week. Denies hypoglycemic symptoms. Occasional finger paresthesias. Last diabetic eye exam DUE. Pneumovax: DUE. Prevnar: not due. Glucometer brand: no functioning meter at home. DSME: completed in the hospital. Lab Results  Component Value Date   HGBA1C 9.3 (A) 09/26/2018   Diabetic Foot Exam - Simple   No data filed     Lab Results  Component Value Date   MICROALBUR 29.6 (H) 02/24/2016     R knee swelling present for the past few days. Denies inciting trauma/injury or falls. No redness or warmth of knee. No ho gout. Significant stiffness of knee. More painful getting in and out of car or when putting on pants. May have started after walking trails.   Had recently run out of furosemide as well. Otherwise reports compliance with cardiac medications.   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      Relevant past medical, surgical, family and social history reviewed and updated as indicated. Interim medical history since our last visit reviewed. Allergies and medications reviewed and updated. Outpatient Medications Prior to Visit  Medication Sig Dispense Refill  . aspirin EC 81 MG tablet Take 1 tablet (81 mg total) by mouth daily.    Marland Kitchen ENTRESTO 24-26 MG TAKE 1 TABLET BY MOUTH TWICE DAILY 180 tablet 2  . furosemide (LASIX) 20 MG tablet Take 1 tablet (20 mg total) by mouth daily. 90 tablet 3  . glucose blood (BAYER CONTOUR NEXT TEST) test strip Use as instructed 100 each 5  . ibuprofen (ADVIL,MOTRIN) 200 MG tablet Take 200 mg by mouth as needed for moderate pain.     . Lancets (ONETOUCH ULTRASOFT) lancets Use as instructed 100 each 5  . metFORMIN (GLUCOPHAGE) 850 MG tablet Take 1 tablet (850 mg total) by mouth 2 (two) times daily with a meal. 180 tablet 1  . pantoprazole (PROTONIX) 20 MG tablet Take 1 tablet (20 mg total) by mouth daily as needed for heartburn or indigestion. 30 tablet 1  . sildenafil (VIAGRA) 50 MG tablet Take 1 tablet (50 mg total) by mouth daily as needed for erectile dysfunction. 20 tablet 0  . sotalol (BETAPACE) 160 MG tablet Take 1 tablet (160 mg total) by mouth every 12 (twelve) hours.  60 tablet 6  . metFORMIN (GLUCOPHAGE-XR) 750 MG 24 hr tablet Take 2 tablets (1,500 mg total) by mouth daily with breakfast. (Patient not taking: Reported on 09/26/2018) 180 tablet 3   No facility-administered medications prior to visit.      Per HPI unless specifically indicated in ROS section below Review of Systems Objective:    BP 132/84 (BP Location: Left Arm, Patient Position: Sitting, Cuff Size: Large)   Pulse 78   Temp 97.6 F (36.4 C) (Tympanic)   Ht 5\' 9"  (1.753 m)   Wt (!) 380 lb (172.4 kg)   BMI 56.12 kg/m   Wt Readings from Last 3 Encounters:  09/26/18 (!) 380 lb (172.4 kg)  03/08/18 (!) 360 lb 8 oz (163.5  kg)  02/28/18 (!) 363 lb (164.7 kg)     Physical exam: Gen: alert, NAD, not ill appearing Pulm: speaks in complete sentences without increased work of breathing Psych: normal mood, normal thought content      Results for orders placed or performed in visit on 09/26/18  POCT glycosylated hemoglobin (Hb A1C)  Result Value Ref Range   Hemoglobin A1C 9.3 (A) 4.0 - 5.6 %   HbA1c POC (<> result, manual entry)     HbA1c, POC (prediabetic range)     HbA1c, POC (controlled diabetic range)     Lab Results  Component Value Date   CREATININE 0.95 01/01/2018   BUN 16 01/01/2018   NA 142 01/01/2018   K 4.5 01/01/2018   CL 103 01/01/2018   CO2 22 01/01/2018    Lab Results  Component Value Date   WBC 7.1 12/21/2017   HGB 14.7 12/21/2017   HCT 46.9 12/21/2017   MCV 92.0 12/21/2017   PLT 364 12/21/2017    Assessment & Plan:   Problem List Items Addressed This Visit    Morbid obesity with BMI of 50.0-59.9, adult ([place])    20 lb weight gain noted - attributes to poor food choices with recent pandemic stay at home order.       Diabetes mellitus type 2, uncontrolled, with complications (HCC) - Primary    Chronic, somewhat improved but still above goal (of at least <8%, ideally <7%). Encouraged renewed efforts at diabetic diet, limiting sweetened beverages. Reviewed change to metformin 850mg  bid due to XR recall and to monitor for worsening GI distress on new immediate release formulation.       Relevant Orders   POCT glycosylated hemoglobin (Hb A1C) (Completed)   Acute pain of right knee    Possible arthritis flare vs other. Will trial naprosyn sparingly. If not improving, recommended in-office evaluation.           Meds ordered this encounter  Medications  . naproxen (NAPROSYN) 500 MG tablet    Sig: Take one po bid x 1 week then prn pain, take with food    Dispense:  40 tablet    Refill:  0   Orders Placed This Encounter  Procedures  . POCT glycosylated hemoglobin (Hb A1C)     I discussed the assessment and treatment plan with the patient. The patient was provided an opportunity to ask questions and all were answered. The patient agreed with the plan and demonstrated an understanding of the instructions. The patient was advised to call back or seek an in-person evaluation if the symptoms worsen or if the condition fails to improve as anticipated.  Follow up plan: Return in about 3 months (around 12/27/2018) for annual exam, prior fasting for  blood work.  Ria Bush, MD

## 2018-09-26 NOTE — Assessment & Plan Note (Signed)
Possible arthritis flare vs other. Will trial naprosyn sparingly. If not improving, recommended in-office evaluation.

## 2018-09-26 NOTE — Assessment & Plan Note (Signed)
20 lb weight gain noted - attributes to poor food choices with recent pandemic stay at home order.

## 2018-09-26 NOTE — Assessment & Plan Note (Signed)
Chronic, somewhat improved but still above goal (of at least <8%, ideally <7%). Encouraged renewed efforts at diabetic diet, limiting sweetened beverages. Reviewed change to metformin 850mg  bid due to XR recall and to monitor for worsening GI distress on new immediate release formulation.

## 2018-10-05 ENCOUNTER — Telehealth: Payer: Self-pay | Admitting: Family Medicine

## 2018-10-05 NOTE — Telephone Encounter (Signed)
plz schedule in-office visit for evaluation. 

## 2018-10-05 NOTE — Telephone Encounter (Signed)
Patient called about his knee pain not improving.  Patient stated that at his last visit it was discussed that if the pain did not improve in 1 week to call and let us know so an xray could be ordered.    Patient C/B # [phone]

## 2018-10-08 ENCOUNTER — Other Ambulatory Visit: Payer: Self-pay

## 2018-10-08 ENCOUNTER — Ambulatory Visit (INDEPENDENT_AMBULATORY_CARE_PROVIDER_SITE_OTHER): Payer: 59 | Admitting: Family Medicine

## 2018-10-08 ENCOUNTER — Ambulatory Visit (INDEPENDENT_AMBULATORY_CARE_PROVIDER_SITE_OTHER)
Admission: RE | Admit: 2018-10-08 | Discharge: 2018-10-08 | Disposition: A | Discharge status: Home / Self Care | Payer: 59 | Source: Ambulatory Visit | Attending: Family Medicine | Admitting: Family Medicine

## 2018-10-08 ENCOUNTER — Encounter: Payer: Self-pay | Admitting: Family Medicine

## 2018-10-08 VITALS — BP 142/90 | HR 81 | Temp 97.8°F | Ht 69.0 in | Wt 383.4 lb

## 2018-10-08 DIAGNOSIS — M25561 Pain in right knee: Secondary | ICD-10-CM

## 2018-10-08 DIAGNOSIS — Z6841 Body Mass Index (BMI) 40.0 and over, adult: Secondary | ICD-10-CM

## 2018-10-08 NOTE — Patient Instructions (Signed)
I am suspicious for meniscal tear.  Knee xray today.  We may refer you to orthopedist pending xray results.

## 2018-10-08 NOTE — Telephone Encounter (Signed)
Pt scheduled for 10/08/18 @ 2pm

## 2018-10-08 NOTE — Progress Notes (Signed)
This visit was conducted in person.  BP (!) 142/90 (BP Location: Left Arm, Patient Position: Sitting, Cuff Size: Large)   Pulse 81   Temp 97.8 F (36.6 C) (Temporal)   Ht 5\' 9"  (1.753 m)   Wt (!) 383 lb 6.4 oz (173.9 kg)   SpO2 96%   BMI 56.62 kg/m    CC: R knee pain Subjective:    Patient ID: Alvin Phillips, male    DOB: 06-05-1980, 38 y.o.   MRN: [id number]  HPI: Alvin Phillips is a 38 y.o. male presenting on 10/08/2018 for Knee Pain (Knee pain x 2 weeks. Pt states that he can walk on his but at times there will be a shooting pain through knee, "feels flemsy" and pops with extension. Some swelling)   R knee pain and swelling x 2-3 wks - denies inciting trauma/injury or falls. No redness or warmth of knee. Knee feels stiff, worse pain with getting in and out of car, getting out of shower, putting on pants or repositioning at bedtime. May have started after stepping on roots while walking on trails. Knee brace helps but he has not been able to keep it on. Some knee instability with walking. No knee locking.   Treated 2 wks ago with naprosyn course with some benefit.      Relevant past medical, surgical, family and social history reviewed and updated as indicated. Interim medical history since our last visit reviewed. Allergies and medications reviewed and updated. Outpatient Medications Prior to Visit  Medication Sig Dispense Refill  . aspirin EC 81 MG tablet Take 1 tablet (81 mg total) by mouth daily.    Marland Kitchen ENTRESTO 24-26 MG TAKE 1 TABLET BY MOUTH TWICE DAILY 180 tablet 2  . furosemide (LASIX) 20 MG tablet Take 1 tablet (20 mg total) by mouth daily. 90 tablet 3  . glucose blood (BAYER CONTOUR NEXT TEST) test strip Use as instructed 100 each 5  . ibuprofen (ADVIL,MOTRIN) 200 MG tablet Take 200 mg by mouth as needed for moderate pain.     . Lancets (ONETOUCH ULTRASOFT) lancets Use as instructed 100 each 5  . metFORMIN (GLUCOPHAGE) 850 MG tablet Take 1 tablet (850 mg total) by mouth 2  (two) times daily with a meal. 180 tablet 1  . naproxen (NAPROSYN) 500 MG tablet Take one po bid x 1 week then prn pain, take with food 40 tablet 0  . pantoprazole (PROTONIX) 20 MG tablet Take 1 tablet (20 mg total) by mouth daily as needed for heartburn or indigestion. 30 tablet 1  . sildenafil (VIAGRA) 50 MG tablet Take 1 tablet (50 mg total) by mouth daily as needed for erectile dysfunction. 20 tablet 0  . sotalol (BETAPACE) 160 MG tablet Take 1 tablet (160 mg total) by mouth every 12 (twelve) hours. 60 tablet 6   No facility-administered medications prior to visit.      Per HPI unless specifically indicated in ROS section below Review of Systems Objective:    BP (!) 142/90 (BP Location: Left Arm, Patient Position: Sitting, Cuff Size: Large)   Pulse 81   Temp 97.8 F (36.6 C) (Temporal)   Ht 5\' 9"  (1.753 m)   Wt (!) 383 lb 6.4 oz (173.9 kg)   SpO2 96%   BMI 56.62 kg/m   Wt Readings from Last 3 Encounters:  10/08/18 (!) 383 lb 6.4 oz (173.9 kg)  09/26/18 (!) 380 lb (172.4 kg)  03/08/18 (!) 360 lb 8 oz (163.5 kg)  Physical Exam Vitals signs and nursing note reviewed.  Constitutional:      Appearance: Normal appearance. He is not ill-appearing.  Musculoskeletal: Normal range of motion.     Comments: L knee WNL R knee exam: No deformity on inspection.  Tender to palpation anterior knee  No effusion/swelling noted.  FROM in flex/extension without crepitus.  No popliteal fullness.  Neg drawer test. + tender and some crepitus with mcmurray test. No pain with valgus/varus stress. No PFgrind. No abnormal patellar mobility.   Skin:    General: Skin is warm and dry.     Findings: No erythema or rash.  Neurological:     Mental Status: He is alert.       Results for orders placed or performed in visit on 09/26/18  POCT glycosylated hemoglobin (Hb A1C)  Result Value Ref Range   Hemoglobin A1C 9.3 (A) 4.0 - 5.6 %   HbA1c POC (<> result, manual entry)     HbA1c, POC  (prediabetic range)     HbA1c, POC (controlled diabetic range)     Assessment & Plan:   Problem List Items Addressed This Visit    Morbid obesity with BMI of 50.0-59.9, adult ([place])    Reviewed importance of weight loss to help control progression of osteoarthritis       Acute pain of right knee - Primary    Ongoing despite naprosyn course. Exam and story somewhat suspicious for meniscal tear. Xray today, then consider ortho referral.       Relevant Orders   DG Knee 4 Views W/Patella Right       No orders of the defined types were placed in this encounter.  Orders Placed This Encounter  Procedures  . DG Knee 4 Views W/Patella Right    Standing Status:   Future    Number of Occurrences:   1    Standing Expiration Date:   12/09/2019    Order Specific Question:   Reason for Exam (SYMPTOM  OR DIAGNOSIS REQUIRED)    Answer:   R knee pain x3 wks - weight bearing    Order Specific Question:   Preferred imaging location?    Answer:   [facility]    Order Specific Question:   Radiology Contrast Protocol - do NOT remove file path    Answer:   [email or web address]    Patient Instructions  I am suspicious for meniscal tear.  Knee xray today.  We may refer you to orthopedist pending xray results.    Follow up plan: Return if symptoms worsen or fail to improve.  Ria Bush, MD

## 2018-10-08 NOTE — Assessment & Plan Note (Signed)
Ongoing despite naprosyn course. Exam and story somewhat suspicious for meniscal tear. Xray today, then consider ortho referral.

## 2018-10-08 NOTE — Assessment & Plan Note (Signed)
Reviewed importance of weight loss to help control progression of osteoarthritis

## 2018-10-09 ENCOUNTER — Other Ambulatory Visit: Payer: Self-pay | Admitting: Family Medicine

## 2018-10-09 DIAGNOSIS — M25561 Pain in right knee: Secondary | ICD-10-CM

## 2018-11-27 ENCOUNTER — Encounter: Payer: Self-pay | Admitting: Internal Medicine

## 2018-11-27 ENCOUNTER — Ambulatory Visit (INDEPENDENT_AMBULATORY_CARE_PROVIDER_SITE_OTHER): Payer: 59 | Admitting: Internal Medicine

## 2018-11-27 ENCOUNTER — Other Ambulatory Visit: Payer: Self-pay

## 2018-11-27 DIAGNOSIS — Z20822 Contact with and (suspected) exposure to covid-19: Secondary | ICD-10-CM

## 2018-11-27 DIAGNOSIS — Z20828 Contact with and (suspected) exposure to other viral communicable diseases: Secondary | ICD-10-CM

## 2018-11-27 DIAGNOSIS — R05 Cough: Secondary | ICD-10-CM

## 2018-11-27 DIAGNOSIS — R058 Other specified cough: Secondary | ICD-10-CM | POA: Insufficient documentation

## 2018-11-27 NOTE — Progress Notes (Signed)
Subjective:    Patient ID: Alvin Phillips, male    DOB: [birth year], 39 y.o.   MRN: [id number]  HPI Virtual visit due to cough Identification done Reviewed billing and he gave consent He is at home and I am in my office  He has been out doing yard work, and with the dog Doesn't feel bad but is congested Cough with greenish phlegm--mostly in the morning Started ~5 days Will break up and come out after his AM cough Spells in AM can be "rough"---like even with a sweat Some loose stools ---tried kaopectate with brief help Also tried mucus relief and tussin cough med (some help)  No clear fever--no thermometer No SOB--but does have some trouble when struggling to bring up phlegm No facial pressure No clear PND  Current Outpatient Medications on File Prior to Visit  Medication Sig Dispense Refill  . aspirin EC 81 MG tablet Take 1 tablet (81 mg total) by mouth daily.    Marland Kitchen ENTRESTO 24-26 MG TAKE 1 TABLET BY MOUTH TWICE DAILY 180 tablet 2  . furosemide (LASIX) 20 MG tablet Take 1 tablet (20 mg total) by mouth daily. 90 tablet 3  . glucose blood (BAYER CONTOUR NEXT TEST) test strip Use as instructed 100 each 5  . ibuprofen (ADVIL,MOTRIN) 200 MG tablet Take 200 mg by mouth as needed for moderate pain.     . Lancets (ONETOUCH ULTRASOFT) lancets Use as instructed 100 each 5  . metFORMIN (GLUCOPHAGE) 850 MG tablet Take 1 tablet (850 mg total) by mouth 2 (two) times daily with a meal. 180 tablet 1  . naproxen (NAPROSYN) 500 MG tablet Take one po bid x 1 week then prn pain, take with food 40 tablet 0  . pantoprazole (PROTONIX) 20 MG tablet Take 1 tablet (20 mg total) by mouth daily as needed for heartburn or indigestion. 30 tablet 1  . sildenafil (VIAGRA) 50 MG tablet Take 1 tablet (50 mg total) by mouth daily as needed for erectile dysfunction. 20 tablet 0  . sotalol (BETAPACE) 160 MG tablet Take 1 tablet (160 mg total) by mouth every 12 (twelve) hours. 60 tablet 6   No current  facility-administered medications on file prior to visit.     No Known Allergies  Past Medical History:  Diagnosis Date  . CHF (congestive heart failure) (HCC)   . Diabetes mellitus without complication (HCC) 07/10/13   referred to DSME, did not show (04/2014)  . GERD (gastroesophageal reflux disease)   . HFrEF (heart failure with reduced ejection fraction) (HCC)    a. 11/2017 Echo: EF 20-25%, diff HK;  b. 02/2018 Echo: EF 30-35%, diff HK  . Hypertension   . Marijuana abuse   . Morbidly obese (HCC) 03/24/2014  . NICM (nonischemic cardiomyopathy) (HCC)    a. 11/2017 Echo: EF 20-25%, diff HK, mild MR, mildy dil LA, PASP ; b. 11/2017 Cath: nl cors. PCWP . CO/CI 4.8/1.79; c. 02/2018 Echo: EF 30-35%, diff HK, mildly dil LA, nl RV fxn. PASP .  . OSA (obstructive sleep apnea) 10/29/2013  . Tobacco abuse   . Wolff-Parkinson-White (WPW) syndrome    a. 11/2017 Admitted w/ SVT @ 190 bpm; b. 12/2017 EPS w/RFCA: right mid septal accessory pathway <1cm from AV node. Ablation performed but developed recurrent WPW post-procedure and placed on Sotalol.    Past Surgical History:  Procedure Laterality Date  . FRACTURE SURGERY Left 2002   fractured arm  . HERNIA REPAIR  12-16-13   umbilical  .  PVC ABLATION N/A 12/22/2017   Procedure: WPW  ABLATION;  Surgeon: Evans Lance, MD;  Location: [place] [facility];  Service: Cardiovascular;  Laterality: N/A;  . RIGHT/LEFT HEART CATH AND CORONARY ANGIOGRAPHY N/A 12/18/2017   Procedure: RIGHT/LEFT HEART CATH AND CORONARY ANGIOGRAPHY;  Surgeon: Wellington Hampshire, MD;  Location: [place] [facility];  Service: Cardiovascular;  Laterality: N/A;    Family History  Problem Relation Age of Onset  . Diabetes Maternal Grandmother   . Heart attack Maternal Grandmother 80  . Diabetes Paternal Grandfather     Social History   Socioeconomic History  . Marital status: Single    Spouse name: Not on file  . Number of children: 2  . Years of education:  Not on file  . Highest education level: Not on file  Occupational History  . Occupation: Theatre manager: SELF-EMPLOYED    Comment: [employer]  Social Needs  . Financial resource strain: Not on file  . Food insecurity    Worry: Not on file    Inability: Not on file  . Transportation needs    Medical: Not on file    Non-medical: Not on file  Tobacco Use  . Smoking status: Former Smoker    Packs/day: 0.25    Years: 15.00    Pack years: 3.75    Types: Cigarettes    Quit date: 2019    Years since quitting: 1.6  . Smokeless tobacco: Never Used  Substance and Sexual Activity  . Alcohol use: Yes    Alcohol/week: 0.0 standard drinks    Comment: Occassional beer, shots of liquor  . Drug use: Yes    Frequency: 2.0 times per week    Types: Marijuana    Comment: 1 time per week; none in 4-6 months.  . Sexual activity: Not on file  Lifestyle  . Physical activity    Days per week: Not on file    Minutes per session: Not on file  . Stress: Not on file  Relationships  . Social Herbalist on phone: Not on file    Gets together: Not on file    Attends religious service: Not on file    Active member of club or organization: Not on file    Attends meetings of clubs or organizations: Not on file    Relationship status: Not on file  . Intimate partner violence    Fear of current or ex partner: Not on file    Emotionally abused: Not on file    Physically abused: Not on file    Forced sexual activity: Not on file  Other Topics Concern  . Not on file  Social History Narrative   Lives with girlfriend Alvin Phillips grew up in [place]. He is currently living in [place]. He has a daughter and son Alvin Phillips, Alvin Phillips). He breeds dogs Barrister's clerk).   Occ: Clinical cytogeneticist rep   Review of Systems Appetite is fine No abdominal pain May have had slight change in taste-- strawberry didn't taste as sweet No change in smell Works at home  Doesn't smoke cigarettes---only occ marijuana    Objective:   Physical Exam  Constitutional: He appears well-developed. No distress.  Respiratory: Effort normal. No respiratory distress.           Assessment & Plan:

## 2018-11-27 NOTE — Assessment & Plan Note (Signed)
Goes back about 5 days No fever or SOB Mostly AM sputum Discussed that this sounds viral---and is consistent with COVID, so asked him to go to [facility] for drive through testing If negative, and symptoms worsen, would consider empiric antibiotic therapy for possible bacterial secondary infection

## 2018-11-28 LAB — NOVEL CORONAVIRUS, NAA: SARS-CoV-2, NAA: NOT DETECTED

## 2018-12-05 ENCOUNTER — Encounter: Payer: Self-pay | Admitting: Internal Medicine

## 2018-12-05 ENCOUNTER — Other Ambulatory Visit
Admission: RE | Admit: 2018-12-05 | Discharge: 2018-12-05 | Disposition: A | Discharge status: Home / Self Care | Payer: 59 | Source: Ambulatory Visit | Attending: Internal Medicine | Admitting: Internal Medicine

## 2018-12-05 ENCOUNTER — Ambulatory Visit (INDEPENDENT_AMBULATORY_CARE_PROVIDER_SITE_OTHER): Payer: 59 | Admitting: Internal Medicine

## 2018-12-05 ENCOUNTER — Other Ambulatory Visit: Payer: Self-pay

## 2018-12-05 VITALS — BP 148/110 | HR 77 | Ht 70.0 in | Wt 382.0 lb

## 2018-12-05 DIAGNOSIS — R05 Cough: Secondary | ICD-10-CM | POA: Diagnosis not present

## 2018-12-05 DIAGNOSIS — I10 Essential (primary) hypertension: Secondary | ICD-10-CM

## 2018-12-05 DIAGNOSIS — I428 Other cardiomyopathies: Secondary | ICD-10-CM

## 2018-12-05 DIAGNOSIS — E1165 Type 2 diabetes mellitus with hyperglycemia: Secondary | ICD-10-CM

## 2018-12-05 DIAGNOSIS — I5022 Chronic systolic (congestive) heart failure: Secondary | ICD-10-CM

## 2018-12-05 DIAGNOSIS — R059 Cough, unspecified: Secondary | ICD-10-CM

## 2018-12-05 DIAGNOSIS — I456 Pre-excitation syndrome: Secondary | ICD-10-CM | POA: Diagnosis not present

## 2018-12-05 DIAGNOSIS — IMO0002 Reserved for concepts with insufficient information to code with codable children: Secondary | ICD-10-CM

## 2018-12-05 DIAGNOSIS — E118 Type 2 diabetes mellitus with unspecified complications: Secondary | ICD-10-CM

## 2018-12-05 LAB — CBC WITH DIFFERENTIAL/PLATELET
Abs Immature Granulocytes: 0.02 10*3/uL (ref 0.00–0.07)
Basophils Absolute: 0 10*3/uL (ref 0.0–0.1)
Basophils Relative: 1 %
Eosinophils Absolute: 0.1 10*3/uL (ref 0.0–0.5)
Eosinophils Relative: 2 %
HCT: 39.1 % (ref 39.0–52.0)
Hemoglobin: 12.7 g/dL — ABNORMAL LOW (ref 13.0–17.0)
Immature Granulocytes: 0 %
Lymphocytes Relative: 39 %
Lymphs Abs: 2.5 10*3/uL (ref 0.7–4.0)
MCH: 29.3 pg (ref 26.0–34.0)
MCHC: 32.5 g/dL (ref 30.0–36.0)
MCV: 90.1 fL (ref 80.0–100.0)
Monocytes Absolute: 0.6 10*3/uL (ref 0.1–1.0)
Monocytes Relative: 9 %
Neutro Abs: 3.1 10*3/uL (ref 1.7–7.7)
Neutrophils Relative %: 49 %
Platelets: 315 10*3/uL (ref 150–400)
RBC: 4.34 MIL/uL (ref 4.22–5.81)
RDW: 12.7 % (ref 11.5–15.5)
WBC: 6.4 10*3/uL (ref 4.0–10.5)
nRBC: 0 % (ref 0.0–0.2)

## 2018-12-05 LAB — BASIC METABOLIC PANEL
Anion gap: 11 (ref 5–15)
BUN: 15 mg/dL (ref 6–20)
CO2: 28 mmol/L (ref 22–32)
Calcium: 8.7 mg/dL — ABNORMAL LOW (ref 8.9–10.3)
Chloride: 97 mmol/L — ABNORMAL LOW (ref 98–111)
Creatinine, Ser: 0.71 mg/dL (ref 0.61–1.24)
GFR calc Af Amer: >60 mL/min (ref >60)
GFR calc non Af Amer: >60 mL/min (ref >60)
Glucose, Bld: 279 mg/dL — ABNORMAL HIGH (ref 70–99)
Potassium: 3.8 mmol/L (ref 3.5–5.1)
Sodium: 136 mmol/L (ref 135–145)

## 2018-12-05 MED ORDER — FUROSEMIDE 40 MG PO TABS: 40.0000 mg | ORAL_TABLET | Freq: Every day | ORAL | 5 refills | Status: DC

## 2018-12-05 MED ORDER — ENTRESTO 49-51 MG PO TABS: 1.0000 | ORAL_TABLET | Freq: Two times a day (BID) | ORAL | 1 refills | Status: DC

## 2018-12-05 NOTE — Patient Instructions (Signed)
Medication Instructions:  Your physician has recommended you make the following change in your medication:  1) STOP Aspirin  2) INCREASE Lasix to 40mg  daily  3) INCREASE Entresto to 49/51 twice daily.  If you need a refill on your cardiac medications before your next appointment, please call your pharmacy.   Lab work: Art gallery manager, Engineer, materials today If you have labs (blood work) drawn today and your tests are completely normal, you will receive your results only by: Marland Kitchen MyChart Message (if you have MyChart) OR . A paper copy in the mail If you have any lab test that is abnormal or we need to change your treatment, we will call you to review the results.  Testing/Procedures: None ordered  Follow-Up: At [facility], you and your health needs are our priority.  As part of our continuing mission to provide you with exceptional heart care, we have created designated Provider Care Teams.  These Care Teams include your primary Cardiologist (physician) and Advanced Practice Providers (APPs -  Physician Assistants and Nurse Practitioners) who all work together to provide you with the care you need, when you need it. You will need a follow up appointment in 2 months.  You may see Nelva Bush, MD or one of the following Advanced Practice Providers on your designated Care Team:   Murray Hodgkins, NP Christell Faith, PA-C . Marrianne Mood, PA-C  Any Other Special Instructions Will Be Listed Below (If Applicable). N/A

## 2018-12-05 NOTE — Progress Notes (Signed)
Follow-up Outpatient Visit Date: 12/05/2018  Primary Care Provider: Ria Phillips, [place] [address]  Chief Complaint: Cough and back pain  HPI:  Alvin Phillips is a 38 y.o. year-old male with history of Wolff-Parkinson-White syndrome, chronic systolic heart failure due to NICM, palpitations, hypertension, uncontrolled diabetes mellitus, morbid obesity, GERD, and sleep apnea, who presents for follow-up of heart failure and WPW (also follows with Alvin Phillips - EP).  He was last seen in our office in 03/08/2018 by Alvin Bayley, NP, at which time he was doing well.  He was maintained on sotalol, Entresto, and furosemide.  Today, Alvin Phillips complains primarily of a cough over the last few weeks as well as left upper back/flank pain.  He feels as though something is stuck in his throat, forcing him to cough.  He also has occasional chest congestion.  He had a COVID test about a week ago and states it was negative.  He denies shortness of breath and chest pain other than intermittent pain in the left upper back/flank area.  It worsens with certain movements and seems to improve when he lies down and stretches.  He has been taking NSAIDs, including naproxen up to 1000 mg, at a time with some relief.  Mr. Mcbrien has been compliant with his heart medications, though he notes missing furosemide for about 4 days.  He had some leg swelling during that time, though it subsequently resolved after briefly doubling up on his furosemide.  He admits to some dietary indiscretion.  He does not exercise regularly.  He reports 2 episodes during which his heart seemed to speed up, lasting about an hour each, most recently a few months ago.  --------------------------------------------------------------------------------------------------  Cardiovascular History & Procedures: Cardiovascular Problems:  Chest pain  Shortness of breath  WPW  Risk Factors:  Hypertension, diabetes  mellitus, male gender, and morbid obesity  Cath/PCI:  R/LHC (12/18/2017): Normal coronary arteries.  Severely elevated left and right heart filling pressures.  Moderately reduced Fick cardiac output/index.  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  Non-Invasive Evaluation(s):  TTE (02/22/2018): Moderately dilated LV with mild LVH.  LVEF 30-35% with global hypokinesis.  Mild LAE.  Normal RV size and function.  Mild to moderate pulmonary hypertension.  TTE (12/15/2017): Severely dilated LV with mild LVH.  LVEF 20-25% with global hypokinesis.  Mild MR.  Mild LAE.  Mildly reduced RV function.  Mild pulmonary hypertension.   Recent CV Pertinent Labs: Lab Results  Component Value Date   CHOL 163 12/17/2017   HDL 27 (L) 12/17/2017   LDLCALC 93 12/17/2017   LDLDIRECT 110.0 12/12/2017   TRIG 215 (H) 12/17/2017   CHOLHDL 6.0 12/17/2017   INR 0.97 12/17/2017   BNP 234.0 (H) 12/14/2017   K 4.5 01/01/2018   K 3.6 12/10/2013   MG 1.8 12/25/2017   BUN 16 01/01/2018   BUN 11 12/10/2013   CREATININE 0.95 01/01/2018   CREATININE 0.97 12/10/2013    Past medical and surgical history were reviewed and updated in EPIC.  Current Meds  Medication Sig  . aspirin EC 81 MG tablet Take 1 tablet (81 mg total) by mouth daily.  Marland Kitchen ENTRESTO 24-26 MG TAKE 1 TABLET BY MOUTH TWICE DAILY  . furosemide (LASIX) 20 MG tablet Take 1 tablet (20 mg total) by mouth daily.  Marland Kitchen  glucose blood (BAYER CONTOUR NEXT TEST) test strip Use as instructed  . ibuprofen (ADVIL,MOTRIN) 200 MG tablet Take 200 mg by mouth as needed for moderate pain.   . Lancets (ONETOUCH ULTRASOFT) lancets Use as instructed  . metFORMIN (GLUCOPHAGE) 850 MG tablet Take 1 tablet (850 mg total) by mouth 2 (two) times daily with a meal.  . naproxen (NAPROSYN) 500 MG tablet Take one po  bid x 1 week then prn pain, take with food  . pantoprazole (PROTONIX) 20 MG tablet Take 1 tablet (20 mg total) by mouth daily as needed for heartburn or indigestion.  . sildenafil (VIAGRA) 50 MG tablet Take 1 tablet (50 mg total) by mouth daily as needed for erectile dysfunction.  . sotalol (BETAPACE) 160 MG tablet Take 1 tablet (160 mg total) by mouth every 12 (twelve) hours.    Allergies: Patient has no known allergies.  Social History   Tobacco Use  . Smoking status: Former Smoker    Packs/day: 0.25    Years: 15.00    Pack years: 3.75    Types: Cigarettes    Quit date: 2019    Years since quitting: 1.7  . Smokeless tobacco: Never Used  Substance Use Topics  . Alcohol use: Yes    Alcohol/week: 0.0 standard drinks    Comment: Occassional beer, shots of liquor  . Drug use: Yes    Frequency: 2.0 times per week    Types: Marijuana    Comment: 1 time per week; none in 4-6 months.    Family History  Problem Relation Age of Onset  . Diabetes Maternal Grandmother   . Heart attack Maternal Grandmother 80  . Diabetes Paternal Grandfather     Review of Systems: A 12-system review of systems was performed and was negative except as noted in the HPI.  --------------------------------------------------------------------------------------------------  Physical Exam: BP (!) 148/110 (BP Location: Left Arm, Patient Position: Sitting, Cuff Size: Large)   Pulse 77   Ht 5\' 10"  (1.778 m)   Wt (!) 382 lb (173.3 kg)   BMI 54.81 kg/m   General: NAD. HEENT: No conjunctival pallor or scleral icterus. Neck: Supple without lymphadenopathy, thyromegaly, JVD, or HJR, though evaluation is limited by body habitus. Lungs: Normal work of breathing. Clear to auscultation bilaterally without wheezes or crackles. Heart: Regular rate and rhythm without murmurs, rubs, or gallops.  Unable to assess PMI due to body habitus Abd: Bowel sounds present. Soft, NT/ND.  Unable to assess HSM due to body  habitus. Ext: Trace pretibial edema bilaterally. Skin: Warm and dry without rash.  EKG: Normal sinus rhythm with subtle delta wave, LVH, nonspecific T wave abnormality.  QT 400 ms with borderline QT prolongation.  Lab Results  Component Value Date   WBC 7.1 12/21/2017   HGB 14.7 12/21/2017   HCT 46.9 12/21/2017   MCV 92.0 12/21/2017   PLT 364 12/21/2017    Lab Results  Component Value Date   NA 142 01/01/2018   K 4.5 01/01/2018   CL 103 01/01/2018   CO2 22 01/01/2018   BUN 16 01/01/2018   CREATININE 0.95 01/01/2018   GLUCOSE 122 (H) 01/01/2018   ALT 57 (H) 12/14/2017    Lab Results  Component Value Date   CHOL 163 12/17/2017   HDL 27 (L) 12/17/2017   LDLCALC 93 12/17/2017   LDLDIRECT 110.0 12/12/2017   TRIG 215 (H) 12/17/2017   CHOLHDL 6.0 12/17/2017    --------------------------------------------------------------------------------------------------  ASSESSMENT AND PLAN: Chronic systolic heart failure secondary to nonischemic  cardiomyopathy: Mr. Wint reports some exertional symptoms, albeit mild given that he is relatively sedentary.  I suspect he has NYHA class II heart failure.  His weight is up a few pounds over the last couple of months with intermittent leg edema.  I have advised him to increase furosemide to 40 mg daily.  We will also increase Entresto to 49-51 mg twice daily.  Continue sotalol per the direction of Dr. Ladona Ridgel; I will defer adding an evidence-based beta-blocker at that time.  I encouraged Mr. Navas to minimize NSAID use.  I will check a basic metabolic panel today and again in 2 weeks.  In the future, we could consider addition of spironolactone to optimize goal-directed medical therapy.  Wolff-Parkinson-White syndrome: Rare palpitations noted.  EKG today with possible subtle delta wave, consistent with incomplete ablation of accessory pathway.  We will continue sotalol.  I will check a CMP and magnesium level today.  Patient to follow-up with Dr.  Ladona Ridgel at his earliest convenience.  Cough: This could be due to a number of factors.  Given some evidence of volume overload, we will increase furosemide.  I think it is less likely to be associated with Entresto, though rarely ARB's can also cause a cough.  At this time, I think the benefits of continued ARB use outweigh the risks.  Morbid obesity: BMI remains greater than 50 with multiple comorbidities.  Encouraged weight loss through diet and exercise.  Hypertension: Blood pressure mildly elevated today (goal less than 130/80).  We will increase Entresto and furosemide as above.  Type 2 diabetes mellitus: Continue metformin for now.  Addition of empagliflozin should be considered in the setting of heart failure.  I will defer this to Dr. Sharen Hones.  Follow-up: Return to clinic in 2 months.  Yvonne Kendall, MD 12/05/2018 2:18 PM

## 2018-12-06 ENCOUNTER — Telehealth: Payer: Self-pay | Admitting: Family Medicine

## 2018-12-06 MED ORDER — AMOXICILLIN 500 MG PO TABS: 1000.0000 mg | ORAL_TABLET | Freq: Two times a day (BID) | ORAL | 0 refills | Status: AC

## 2018-12-06 NOTE — Telephone Encounter (Signed)
Pt seen virtually 11/27/2018 with Dr Silvio Pate for cough. Pt went and got Covid testing as directed. Cough still not improving.     Assessment & Plan Note by Venia Carbon, MD at 11/27/2018 11:50 AM Goes back about 5 days No fever or SOB Mostly AM sputum Discussed that this sounds viral---and is consistent with COVID, so asked him to go to [facility] for drive through testing If negative, and symptoms worsen, would consider empiric antibiotic therapy for possible bacterial secondary infection                  Pt still having cough, denies chest tightness, CP or SOB Pt having some chest wall pain / muscle pain  States that he used some muscle ache rub on his side and the pain went away but is now back.  Cough is productive, yellow/Darthula Desa in color.    Covid test was negative, done x 1 week ago.  Pt feels he needs an antibiotic.   Will send to Dr Silvio Pate for continuity of care and also since Dr Darnell Level is not in office this afternoon.   Please advise.

## 2018-12-06 NOTE — Telephone Encounter (Signed)
Spoke to pt

## 2018-12-06 NOTE — Telephone Encounter (Signed)
Please let him know I sent him an antibiotic ---to [facility]

## 2018-12-07 ENCOUNTER — Telehealth: Payer: Self-pay | Admitting: Internal Medicine

## 2018-12-07 NOTE — Telephone Encounter (Signed)
I spoke with the patient regarding his lab results and Dr. Darnelle Bos recommendations to: 1) Continue with increased doses of lasix/ entresto 2) repeat a BMP in 2 weeks.  Per the patient, he went to get the new dose of entresto yesterday and was told this would be ~$1700. I inquired if his insurance was paying for the lower dose entresto and he states this was running him $10-$30. I advised him they may not have applied his Co-pay card to his RX. He is aware I will call the pharmacy to follow up on this.   I spoke with Crystal at [facility] on [address]. She states that she thinks the new RX was run too soon after her picked up his lower dose entresto- it has only been 8 days. She re-ran the RX today. It will not go through for 180 tablets, but would go through for # 60 tablets at the $10 copay now.  I have called and notified the patient. He voices understanding and will pick this up.

## 2018-12-07 NOTE — Telephone Encounter (Signed)
Notes recorded by End, Harrell Gave, MD on 12/05/2018 at 10:49 PM EDT  Please let Alvin Phillips know that his kidney function and potassium are normal. His blood sure is quite elevated, suggesting subopimal diabetes control. He has borderline anemia, which should be followed by his PCP. I recommend that he continue with the increased doses of furosemide and Entresto, as discussed at today's office visit. He should have a repeat BMP in ~2 weeks.

## 2018-12-12 ENCOUNTER — Telehealth: Payer: Self-pay | Admitting: *Deleted

## 2018-12-12 MED ORDER — DOXYCYCLINE HYCLATE 100 MG PO TABS: 100.0000 mg | ORAL_TABLET | Freq: Two times a day (BID) | ORAL | 0 refills | Status: DC

## 2018-12-12 NOTE — Telephone Encounter (Signed)
Tested Covid19 negative. Did increasing lasix (furosemide) dose help at all? Has he started higher entresto dose?  Will add doxycycline. If not improving with this, will need in-person [facility] evaluation.

## 2018-12-12 NOTE — Telephone Encounter (Addendum)
Spoke with pt relaying Dr. Synthia Innocent message.  Pt verbalizes understanding.  States the Lasix seems to be helping but he's not sure about the higher Entresto dose.

## 2018-12-12 NOTE — Telephone Encounter (Signed)
Patient called stating that he is still having a cough and side pain. Patient stated that he has been on an antibiotic for 4-5 days and taking robitussin. Patient stated that he has a productive cough/yellow/green, no SOB or difficulty breathing and no fever. Patient stated that he is not any better even after being on the antibiotic for 4-5 days.  Pharmacy [facility]

## 2018-12-17 ENCOUNTER — Telehealth: Payer: Self-pay | Admitting: *Deleted

## 2018-12-17 NOTE — Telephone Encounter (Signed)
Patient notified as instructed by telephone and verbalized understanding. Patient stated that he does not have a decrease in urine because he is drinking a lot. Patient did state that his mouth and throat are real dry. Patient stated that he will go ahead and go to an urgent care for evaluation.

## 2018-12-17 NOTE — Telephone Encounter (Signed)
Patient left a voicemail stating that he doubled up on his Lasix and the last 2 days he has been extremely dehydrated. Patient stated that he has been drinking lots of fluids, but that does not seem to be helping. Patient stated that he has been on an antibiotic and the cough seems to be better. Patient wants to know what to do and wondering if he should go to an Urgent Care?

## 2018-12-17 NOTE — Telephone Encounter (Signed)
If feeling really dehydrated (decreased urine output, dry mouth, dry skin), [facility]-person eval.

## 2018-12-20 ENCOUNTER — Encounter: Payer: Self-pay | Admitting: Internal Medicine

## 2018-12-21 NOTE — Telephone Encounter (Signed)
Plan was to go to [facility] this week.  plz call Monday for update - did he go to [facility] and is he feeling better?

## 2018-12-24 ENCOUNTER — Other Ambulatory Visit: Payer: Self-pay | Admitting: Family Medicine

## 2018-12-24 ENCOUNTER — Other Ambulatory Visit: Payer: 59

## 2018-12-24 ENCOUNTER — Other Ambulatory Visit: Payer: Self-pay

## 2018-12-24 DIAGNOSIS — IMO0002 Reserved for concepts with insufficient information to code with codable children: Secondary | ICD-10-CM

## 2018-12-24 DIAGNOSIS — R809 Proteinuria, unspecified: Secondary | ICD-10-CM

## 2018-12-24 DIAGNOSIS — E1165 Type 2 diabetes mellitus with hyperglycemia: Secondary | ICD-10-CM

## 2018-12-24 NOTE — Telephone Encounter (Signed)
Attempt to contact pt.  No answer.  No vm.  Need to find out if pt went to UC and how he is doing.  

## 2018-12-25 NOTE — Telephone Encounter (Signed)
Attempt to contact pt.  No answer.  No vm.  Need to find out if pt went to UC and how he is doing.  

## 2018-12-26 NOTE — Telephone Encounter (Signed)
Attempt to contact pt.  No answer.  No vm.  Need to find out if pt went to UC and how he is doing.

## 2018-12-27 NOTE — Telephone Encounter (Signed)
Spoke with pt asking if he was seen at [facility].  States he did not go due to cost.  Pt says he still has cough with phlegm.  Denies any fever, SOB, body aches or other COVID sxs.  Pt says he will start 2nd round of abx tomorrow (sent by Dr. Darnell Level).  Pt was not able to fill previously due to limited finances.  Pt will call with an update after completing abx.  Fyi to Dr. Darnell Level.

## 2018-12-31 ENCOUNTER — Ambulatory Visit: Payer: 59 | Admitting: Family Medicine

## 2018-12-31 DIAGNOSIS — Z0289 Encounter for other administrative examinations: Secondary | ICD-10-CM

## 2019-01-29 ENCOUNTER — Other Ambulatory Visit: Payer: Self-pay

## 2019-01-29 ENCOUNTER — Ambulatory Visit (INDEPENDENT_AMBULATORY_CARE_PROVIDER_SITE_OTHER): Payer: 59 | Admitting: Family Medicine

## 2019-01-29 ENCOUNTER — Encounter: Payer: Self-pay | Admitting: Family Medicine

## 2019-01-29 VITALS — BP 130/80 | HR 75 | Temp 97.8°F | Ht 70.0 in | Wt 373.1 lb

## 2019-01-29 DIAGNOSIS — N489 Disorder of penis, unspecified: Secondary | ICD-10-CM | POA: Diagnosis not present

## 2019-01-29 DIAGNOSIS — E118 Type 2 diabetes mellitus with unspecified complications: Secondary | ICD-10-CM

## 2019-01-29 DIAGNOSIS — E1165 Type 2 diabetes mellitus with hyperglycemia: Secondary | ICD-10-CM

## 2019-01-29 DIAGNOSIS — N529 Male erectile dysfunction, unspecified: Secondary | ICD-10-CM | POA: Diagnosis not present

## 2019-01-29 DIAGNOSIS — IMO0002 Reserved for concepts with insufficient information to code with codable children: Secondary | ICD-10-CM

## 2019-01-29 DIAGNOSIS — Z6841 Body Mass Index (BMI) 40.0 and over, adult: Secondary | ICD-10-CM

## 2019-01-29 LAB — POCT GLYCOSYLATED HEMOGLOBIN (HGB A1C): Hemoglobin A1C: 14.2 % — AB (ref 4.0–5.6)

## 2019-01-29 MED ORDER — SILDENAFIL CITRATE 50 MG PO TABS: 50.0000 mg | ORAL_TABLET | Freq: Every day | ORAL | 0 refills | Status: DC | PRN

## 2019-01-29 MED ORDER — CLOTRIMAZOLE 1 % EX CREA: 1.0000 "application " | TOPICAL_CREAM | Freq: Two times a day (BID) | CUTANEOUS | 1 refills | Status: DC

## 2019-01-29 NOTE — Assessment & Plan Note (Addendum)
Chronic, deteriorated. Only taking metformin once a day, forgets PM dose, we previously transitioned off metformin XR due to recall. Endorses dietary indiscretion in setting of increased work and home stress in pandemic. Update A1c.

## 2019-01-29 NOTE — Progress Notes (Signed)
This visit was conducted in person.  BP 130/80 (BP Location: Left Arm, Patient Position: Sitting, Cuff Size: Large)   Pulse 75   Temp 97.8 F (36.6 C) (Temporal)   Ht 5\' 10"  (1.778 m)   Wt (!) 373 lb 1 oz (169.2 kg)   SpO2 95%   BMI 53.53 kg/m    CC: genital rash  Subjective:    Patient ID: Alvin Phillips, male    DOB: [birth year], 38 y.o.   MRN: [id number]  HPI: Alvin Phillips is a 38 y.o. male presenting on 01/29/2019 for Rash (C/o genital bumps and itching.  Started about 1 mo ago.  No intercourse in 2-3 mos.  Denies any urinary issues. Tried OTC antifungal cream, not helpful. )   1 mo h/o itchy bumps on bottom of shaft of penis as well as irritation around head of penis. No urethral discharge. No dysuria.  Tried alcohol but this caused pain. Tried OTC antifungal ?lamisil cream once daily for 2 wks without benefit.  Has GF. No sexual relation in months. No new sexual partners.   Known diabetic - not checking sugars. Unsure when he last checked. Not good with taking night time metformin dose. Has started vegetable diet. Thinks sugars are high due to polyuria.  Lab Results  Component Value Date   HGBA1C 14.2 (A) 01/29/2019   Struggles with stress eating. Interested in further evaluation of bariatric surgery.      Relevant past medical, surgical, family and social history reviewed and updated as indicated. Interim medical history since our last visit reviewed. Allergies and medications reviewed and updated. Outpatient Medications Prior to Visit  Medication Sig Dispense Refill  . furosemide (LASIX) 40 MG tablet Take 1 tablet (40 mg total) by mouth daily. 30 tablet 5  . glucose blood (BAYER CONTOUR NEXT TEST) test strip Use as instructed 100 each 5  . ibuprofen (ADVIL,MOTRIN) 200 MG tablet Take 200 mg by mouth as needed for moderate pain.     . Lancets (ONETOUCH ULTRASOFT) lancets Use as instructed 100 each 5  . metFORMIN (GLUCOPHAGE) 850 MG tablet Take 1 tablet (850 mg total) by  mouth 2 (two) times daily with a meal. 180 tablet 1  . naproxen (NAPROSYN) 500 MG tablet Take one po bid x 1 week then prn pain, take with food 40 tablet 0  . sacubitril-valsartan (ENTRESTO) 49-51 MG Take 1 tablet by mouth 2 (two) times daily. 180 tablet 1  . sotalol (BETAPACE) 160 MG tablet Take 1 tablet (160 mg total) by mouth every 12 (twelve) hours. 60 tablet 6  . sildenafil (VIAGRA) 50 MG tablet Take 1 tablet (50 mg total) by mouth daily as needed for erectile dysfunction. 20 tablet 0  . pantoprazole (PROTONIX) 20 MG tablet Take 1 tablet (20 mg total) by mouth daily as needed for heartburn or indigestion. (Patient not taking: Reported on 01/29/2019) 30 tablet 1  . doxycycline (VIBRA-TABS) 100 MG tablet Take 1 tablet (100 mg total) by mouth 2 (two) times daily. 14 tablet 0   No facility-administered medications prior to visit.      Per HPI unless specifically indicated in ROS section below Review of Systems Objective:    BP 130/80 (BP Location: Left Arm, Patient Position: Sitting, Cuff Size: Large)   Pulse 75   Temp 97.8 F (36.6 C) (Temporal)   Ht 5\' 10"  (1.778 m)   Wt (!) 373 lb 1 oz (169.2 kg)   SpO2 95%   BMI 53.53 kg/m  Wt Readings from Last 3 Encounters:  01/29/19 (!) 373 lb 1 oz (169.2 kg)  12/05/18 (!) 382 lb (173.3 kg)  11/27/18 (!) 375 lb (170.1 kg)    Physical Exam Vitals signs and nursing note reviewed.  Constitutional:      General: He is not in acute distress.    Appearance: Normal appearance. He is obese. He is not ill-appearing.  Genitourinary:    Pubic Area: Rash present.     Penis: Circumcised. Erythema, tenderness and lesions present.      Scrotum/Testes: Normal.        Right: Mass, tenderness or swelling not present.        Left: Mass, tenderness or swelling not present.     Comments:  Cluster of exophytic verrucous growths ventral penis at base of glans Abrasions ventral glans penis as well as maceration along ventral edge of glans Neurological:      Mental Status: He is alert.  Psychiatric:        Mood and Affect: Mood normal.        Behavior: Behavior normal.       Results for orders placed or performed in visit on 01/29/19  POCT A1c  Result Value Ref Range   Hemoglobin A1C 14.2 (A) 4.0 - 5.6 %   HbA1c POC (<> result, manual entry)     HbA1c, POC (prediabetic range)     HbA1c, POC (controlled diabetic range)      Assessment & Plan:   Problem List Items Addressed This Visit    Penile lesion - Primary    Anticipate issue with balanitis anticipate fungal. Treat with clotrimazole OTC cream BID x 3-4 wks Also anticipate has genital warts - reviewed etiology and treatment options but would want balanitis fully healed prior to considering medication like aldara.        Morbid obesity with BMI of 50.0-59.9, adult (HCC)    Chronic, obesity contributes to poor glycemic control. Interested in bariatric surgery - website provided to sign up for virtual bariatric seminar to learn more about these surgeries.        Erectile dysfunction    Generic viagra refilled - previously effective.       Relevant Medications   sildenafil (VIAGRA) 50 MG tablet   Diabetes mellitus type 2, uncontrolled, with complications (HCC)    Chronic, deteriorated. Only taking metformin once a day, forgets PM dose, we previously transitioned off metformin XR due to recall. Endorses dietary indiscretion in setting of increased work and home stress in pandemic. Update A1c.       Relevant Orders   POCT A1c (Completed)       Meds ordered this encounter  Medications  . sildenafil (VIAGRA) 50 MG tablet    Sig: Take 1 tablet (50 mg total) by mouth daily as needed for erectile dysfunction.    Dispense:  20 tablet    Refill:  0  . clotrimazole (LOTRIMIN) 1 % cream    Sig: Apply 1 application topically 2 (two) times daily.    Dispense:  80 g    Refill:  1   Orders Placed This Encounter  Procedures  . POCT A1c    Patient instructions: I think you have  genital warts as well as balanitis or inflammation of the penis likely from fungal infection. Treat with clotrimazole cream twice daily to affected area for next 2 weeks then update me with results.  Work on better sugar control.  After skin heals, we can discuss aldara  cream for warts.  A1c today.  Look into weight loss surgery seminar:  [email or web address]  Follow up plan: Return if symptoms worsen or fail to improve.  Ria Bush, MD

## 2019-01-29 NOTE — Patient Instructions (Addendum)
I think you have genital warts as well as balanitis or inflammation of the penis likely from fungal infection. Treat with clotrimazole cream twice daily to affected area for next 2 weeks then update me with results.  Work on better sugar control.  After skin heals, we can discuss aldara cream for warts.  A1c today.  Look into weight loss surgery seminar:  [email or web address]  Genital Warts Genital warts are a common STD (sexually transmitted disease). They may appear as small bumps on the skin of the genital and anal areas. They sometimes become irritated and cause pain. Genital warts are easily passed to other people through sexual contact. Many people do not know that they are infected. They may be infected for years without symptoms. However, even if they do not have symptoms, they can pass the infection to their sexual partners. Getting treatment is important because genital warts can lead to other problems. In females, the virus that causes genital warts may increase the risk for cervical cancer. What are the causes? This condition is caused by a virus that is called human papillomavirus (HPV). HPV is spread by having unprotected sex with an infected person. It can be spread through vaginal, anal, and oral sex. What increases the risk? You are more likely to develop this condition if:  You have unprotected sex.  You have multiple sexual partners.  You are sexually active before age 85.  You are a man who isnot circumcised.  You have a male sexual partner who is not circumcised.  You have a weakened body defense system (immune system) due to disease or medicine.  You smoke. What are the signs or symptoms? Symptoms of this condition include:  Small growths in the genital area or anal area. These warts often grow in clusters.  Itching and irritation in the genital area or anal area.  Bleeding from the warts.  Pain during sex. How is this  diagnosed? This condition is diagnosed based on:  Your symptoms.  A physical exam. You may also have other tests, including:  Biopsy. A tissue sample is removed so it can be checked under a microscope.  Colposcopy. In females, a magnifying tool is used to examine the vagina and cervix. Certain solutions may be used to make the HPV cells change color so they can be seen more easily.  A Pap test in females.  Tests for other STDs. How is this treated? This condition may be treated with:  Medicines, such as: ? Solutions or creams applied to your skin (topical).  Procedures, such as: ? Freezing the warts with liquid nitrogen (cryotherapy). ? Burning the warts with a laser or electric probe (electrocautery). ? Surgery to remove the warts. Follow these instructions at home: Medicines   Apply over-the-counter and prescription medicines only as told by your health care provider.  Do not treat genital warts with medicines that are used for treating hand warts.  Talk with your health care provider about using over-the-counter anti-itch creams. Instructions for women  Get screened regularly for cervical cancer. Women who have genital warts are at an increased risk for this cancer. This type of cancer is slow growing and can be cured if it is found early.  If you become pregnant, tell your health care provider that you have had an HPV infection. Your health care provider will monitor you closely during pregnancy to be sure that your baby is safe. General instructions  Do not touch or scratch the warts.  Do not have sex  until your treatment has been completed.  Tell your current and past sexual partners about your condition because they may also need treatment.  After treatment, use condoms during sex to prevent future infections.  Keep all follow-up visits as told by your health care provider. This is important. How is this prevented? Talk with your health care provider about  getting the HPV vaccine. The vaccine:  Can prevent some HPV infections and cancers.  Is recommended for males and females who are 19-80 years old.  Is not recommended for pregnant women.  Will not work if you already have HPV. Contact a health care provider if you:  Have redness, swelling, or pain in the area of the treated skin.  Have a fever.  Feel generally ill.  Feel lumps in and around your genital or anal area.  Have bleeding in your genital or anal area.  Have pain during sex. Summary  Genital warts are a common STD (sexually transmitted disease). It may appear as small bumps on the genital and anal areas.  This condition is caused by a virus that is called human papillomavirus (HPV). HPV is spread by having unprotected sex with an infected person. It can be spread through vaginal, anal, and oral sex.  Treatment is important because genital warts can lead to other problems. In females, the virus that causes genital warts may increase the risk for cervical cancer.  This condition may be treated with medicine that is applied to the skin, or procedures to remove the warts.  The HPV vaccine can prevent some HPV infections and cancers. It is recommended that the vaccine be given to males and females who are 66-84 years old. This information is not intended to replace advice given to you by your health care provider. Make sure you discuss any questions you have with your health care provider. Document Released: 03/04/2000 Document Revised: 04/11/2017 Document Reviewed: 04/11/2017 Elsevier Patient Education  2020 [employer].

## 2019-01-30 ENCOUNTER — Telehealth: Payer: Self-pay

## 2019-01-30 ENCOUNTER — Other Ambulatory Visit: Payer: Self-pay | Admitting: Family Medicine

## 2019-01-30 DIAGNOSIS — N489 Disorder of penis, unspecified: Secondary | ICD-10-CM | POA: Insufficient documentation

## 2019-01-30 DIAGNOSIS — A630 Anogenital (venereal) warts: Secondary | ICD-10-CM | POA: Insufficient documentation

## 2019-01-30 DIAGNOSIS — N529 Male erectile dysfunction, unspecified: Secondary | ICD-10-CM | POA: Insufficient documentation

## 2019-01-30 MED ORDER — OZEMPIC (0.25 OR 0.5 MG/DOSE) 2 MG/1.5ML ~~LOC~~ SOPN: PEN_INJECTOR | SUBCUTANEOUS | 3 refills | Status: AC

## 2019-01-30 NOTE — Telephone Encounter (Signed)
Submitted PA request for Ozempic, key:  [id number].  Decision pending.

## 2019-01-30 NOTE — Assessment & Plan Note (Signed)
Anticipate issue with balanitis anticipate fungal. Treat with clotrimazole OTC cream BID x 3-4 wks Also anticipate has genital warts - reviewed etiology and treatment options but would want balanitis fully healed prior to considering medication like aldara.

## 2019-01-30 NOTE — Assessment & Plan Note (Signed)
Generic viagra refilled - previously effective.

## 2019-01-30 NOTE — Assessment & Plan Note (Signed)
Chronic, obesity contributes to poor glycemic control. Interested in bariatric surgery - website provided to sign up for virtual bariatric seminar to learn more about these surgeries.

## 2019-02-08 ENCOUNTER — Ambulatory Visit (INDEPENDENT_AMBULATORY_CARE_PROVIDER_SITE_OTHER): Payer: 59 | Admitting: Internal Medicine

## 2019-02-08 ENCOUNTER — Encounter: Payer: Self-pay | Admitting: Internal Medicine

## 2019-02-08 ENCOUNTER — Other Ambulatory Visit: Payer: Self-pay

## 2019-02-08 VITALS — BP 134/88 | HR 94 | Ht 71.0 in | Wt 367.0 lb

## 2019-02-08 DIAGNOSIS — I456 Pre-excitation syndrome: Secondary | ICD-10-CM

## 2019-02-08 DIAGNOSIS — I10 Essential (primary) hypertension: Secondary | ICD-10-CM

## 2019-02-08 DIAGNOSIS — E1165 Type 2 diabetes mellitus with hyperglycemia: Secondary | ICD-10-CM

## 2019-02-08 DIAGNOSIS — I5022 Chronic systolic (congestive) heart failure: Secondary | ICD-10-CM

## 2019-02-08 DIAGNOSIS — E118 Type 2 diabetes mellitus with unspecified complications: Secondary | ICD-10-CM

## 2019-02-08 DIAGNOSIS — IMO0002 Reserved for concepts with insufficient information to code with codable children: Secondary | ICD-10-CM

## 2019-02-08 MED ORDER — EMPAGLIFLOZIN 10 MG PO TABS: 10.0000 mg | ORAL_TABLET | Freq: Every day | ORAL | 11 refills | Status: DC

## 2019-02-08 MED ORDER — SACUBITRIL-VALSARTAN 97-103 MG PO TABS: 1.0000 | ORAL_TABLET | Freq: Two times a day (BID) | ORAL | 11 refills | Status: DC

## 2019-02-08 NOTE — Patient Instructions (Addendum)
Medication Instructions:  Your physician has recommended you make the following change in your medication:  1. INCREASE Entresto 97-103 mg twice day  2. START Jardiance 10 mg once daily in the morning   *If you need a refill on your cardiac medications before your next appointment, please call your pharmacy*  Lab Work: BMP and Mag in 2 weeks   If you have labs (blood work) drawn today and your tests are completely normal, you will receive your results only by: Marland Kitchen MyChart Message (if you have MyChart) OR . A paper copy in the mail If you have any lab test that is abnormal or we need to change your treatment, we will call you to review the results.  Testing/Procedures: None  Follow-Up: At [facility], you and your health needs are our priority.  As part of our continuing mission to provide you with exceptional heart care, we have created designated Provider Care Teams.  These Care Teams include your primary Cardiologist (physician) and Advanced Practice Providers (APPs -  Physician Assistants and Nurse Practitioners) who all work together to provide you with the care you need, when you need it.  Your next appointment:   3 month(s)  The format for your next appointment:   In Person  Provider:   Nelva Bush, MD

## 2019-02-08 NOTE — Progress Notes (Signed)
Follow-up Outpatient Visit Date: 02/08/2019  Primary Care Provider: Eustaquio Boyden, MD [address] [place] [address]  Chief Complaint: Follow-up WPW and NICM  HPI:  [name] is a 38 y.o. male with history of Wolff-Parkinson-White syndrome, chronic systolic heart failure due to NICM, palpitations, hypertension, uncontrolled diabetes mellitus, morbid obesity, GERD, and sleep apnea, who presents for follow-up of heart failure and WPW (also followed by Dr. Pilar Jarvis).  I last saw him in September, at which time [name] complained of a cough accompanied by left upper back/flank pain.  Preceding COVID-19 test was negative.  His discomfort seem to be worse with specific movements and improved when he would lie down and stretch.  He also reported some benefit from NSAID use.  Due to some leg edema, we agreed to increase furosemide to 40 mg daily.  Sherryll Burger was also uptitrated to 49-51 mg twice daily.  Today, [name] reports that he is feeling relatively well.  He admits that he had gained quite a bit of weight due to dietary indiscretion over the summer and early fall.  However, for the last 2 weeks, he has been diligent about watching what he eats and has lost about 10 pounds.  He hopes to get his weight below 300 pounds over the next 6 to 12 months.  [name] reports 2 episodes of palpitations lasting a few minutes at a time.  There were no associated symptoms.  He has not had any chest pain, shortness of breath, or lightheadedness.  His ankles are mildly swollen from time to time, though this seems to be related to when he is on his feet a lot.  He has not experienced a couple episodes of heartburn in the evenings, though this typically resolves with over-the-counter antacids.  He has tolerated his current medications well, including recent escalation of Entresto.  He makes note of the fact that his diabetes has been poorly controlled in the setting of dietary indiscretion, with a  hemoglobin A1c of 14.2 earlier this month.  --------------------------------------------------------------------------------------------------  Cardiovascular History & Procedures: Cardiovascular Problems:  Chest pain  Shortness of breath  WPW  Risk Factors:  Hypertension, diabetes mellitus, male gender, and morbid obesity  Cath/PCI:  R/LHC (12/18/2017): Normal coronary arteries.  Severely elevated left and right heart filling pressures.  Moderately reduced Fick cardiac output/index.  CV Surgery:  None  EP Procedures and Devices:  EPS/ablation (12/22/2017, Dr. 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  Non-Invasive Evaluation(s):  TTE (02/22/2018): Moderately dilated LV with mild LVH.  LVEF 30-35% with global hypokinesis.  Mild LAE.  Normal RV size and function.  Mild to moderate pulmonary hypertension.  TTE (12/15/2017): Severely dilated LV with mild LVH.  LVEF 20-25% with global hypokinesis.  Mild MR.  Mild LAE.  Mildly reduced RV function.  Mild pulmonary hypertension.  Recent CV Pertinent Labs: Lab Results  Component Value Date   CHOL 163 12/17/2017   HDL 27 (L) 12/17/2017   LDLCALC 93 12/17/2017   LDLDIRECT 110.0 12/12/2017   TRIG 215 (H) 12/17/2017   CHOLHDL 6.0 12/17/2017   INR 0.97 12/17/2017   BNP 234.0 (H) 12/14/2017   K 3.8 12/05/2018   K 3.6 12/10/2013   MG 1.8 12/25/2017   BUN 15 12/05/2018   BUN 16 01/01/2018   BUN 11 12/10/2013   CREATININE 0.71 12/05/2018   CREATININE 0.97 12/10/2013    Past medical and surgical history  were reviewed and updated in EPIC.  No outpatient medications have been marked as taking for the 02/08/19 encounter (Appointment) with Aziz Slape, Cristal Deer, MD.    Allergies: Patient has no known allergies.  Social History   Tobacco Use  . Smoking status: Former Smoker    Packs/day: 0.25    Years: 15.00    Pack years: 3.75    Types:  Cigarettes    Quit date: 2019    Years since quitting: 1.8  . Smokeless tobacco: Never Used  Substance Use Topics  . Alcohol use: Yes    Alcohol/week: 0.0 standard drinks    Comment: Occassional beer, shots of liquor  . Drug use: Yes    Frequency: 2.0 times per week    Types: Marijuana    Comment: 1 time per week; none in 4-6 months.    Family History  Problem Relation Age of Onset  . Diabetes Maternal Grandmother   . Heart attack Maternal Grandmother 80  . Diabetes Paternal Grandfather     Review of Systems: A 12-system review of systems was performed and was negative except as noted in the HPI.  --------------------------------------------------------------------------------------------------  Physical Exam: There were no vitals taken for this visit.  General: Morbidly obese man, seated comfortably in the exam room. HEENT: No conjunctival pallor or scleral icterus. Facemask in place. Neck: Supple without lymphadenopathy, thyromegaly, JVD, or HJR, though evaluation is limited by body habitus. Lungs: Normal work of breathing. Clear to auscultation bilaterally without wheezes or crackles. Heart: Regular rate and rhythm without murmurs, rubs, or gallops.  Unable to assess PMI due to body habitus. Abd: Bowel sounds present. Soft, NT/ND. Ext: Trace pretibial edema bilaterally. Skin: Warm and dry without rash.  EKG: Normal sinus rhythm with borderline LVH.  QTc 442 ms.  No significant change from prior tracing on 12/05/2018.  Lab Results  Component Value Date   WBC 6.4 12/05/2018   HGB 12.7 (L) 12/05/2018   HCT 39.1 12/05/2018   MCV 90.1 12/05/2018   PLT 315 12/05/2018    Lab Results  Component Value Date   NA 136 12/05/2018   K 3.8 12/05/2018   CL 97 (L) 12/05/2018   CO2 28 12/05/2018   BUN 15 12/05/2018   CREATININE 0.71 12/05/2018   GLUCOSE 279 (H) 12/05/2018   ALT 57 (H) 12/14/2017    Lab Results  Component Value Date   CHOL 163 12/17/2017   HDL 27 (L)  12/17/2017   LDLCALC 93 12/17/2017   LDLDIRECT 110.0 12/12/2017   TRIG 215 (H) 12/17/2017   CHOLHDL 6.0 12/17/2017    --------------------------------------------------------------------------------------------------  ASSESSMENT AND PLAN: Chronic systolic heart failure due to nonischemic cardiomyopathy: Mr. Knauer appears grossly euvolemic, though body habitus makes exam challenging.  He reports NYHA class for minimal 2 heart failure symptoms.  We will increase Entresto to 97-103 mg twice daily and check a BMP and magnesium level in about 2 weeks.  I will defer adding carvedilol or metoprolol succinate at this time, given that he remains on sotalol.  However, if heart rate tolerates, we could consider adding an evidence-based beta-blocker in the future as well, if Dr. Ladona Ridgel is in agreement with this.  Given poorly controlled diabetes, we will also initiate empagliflozin 10 mg every morning for improved glycemic control and cardiac benefits.  I anticipate repeating an echocardiogram in the next 3 to 6 months once his evidence-based heart failure therapy has been optimized.  WPW: Rare palpitations reported by Mr. Laurich.  I do not see evidence  of preexcitation on EKG today.  We will continue sotalol 160 mg twice daily.  We will make arrangements for Mr. Carton to follow-up with Dr. Lovena Le at his convenience.  Type 2 diabetes mellitus: Glucose poorly controlled with hemoglobin A1c of 14.2 earlier this month.  Mr. Ungaro is aware of the importance of dietary modification.  We will continue metformin at current dose prescribed by Dr. Danise Mina.  As noted above, we will also add empagliflozin 10 mg daily, which should be titrated up as tolerated in the future.  Morbid obesity: I encouraged Mr. Rone keep working on weight loss through diet and exercise.  He raises the possibility of bariatric surgery, which could be a good option for him down the road.  Hypertension: We will increase Entresto, as  above.  Importance of sodium restriction reinforced today.  Follow-up: Return to clinic in 3 months.  Nelva Bush, MD 02/08/2019 11:26 AM

## 2019-02-09 ENCOUNTER — Encounter: Payer: Self-pay | Admitting: Internal Medicine

## 2019-02-09 DIAGNOSIS — I5022 Chronic systolic (congestive) heart failure: Secondary | ICD-10-CM | POA: Insufficient documentation

## 2019-02-10 ENCOUNTER — Other Ambulatory Visit: Payer: Self-pay | Admitting: Physician Assistant

## 2019-02-20 ENCOUNTER — Telehealth: Payer: Self-pay | Admitting: Family Medicine

## 2019-02-20 DIAGNOSIS — M25561 Pain in right knee: Secondary | ICD-10-CM

## 2019-02-20 MED ORDER — TRAMADOL HCL 50 MG PO TABS: 25.0000 mg | ORAL_TABLET | Freq: Two times a day (BID) | ORAL | 0 refills | Status: AC | PRN

## 2019-02-20 NOTE — Telephone Encounter (Signed)
Spoke with pt asking about referral.  Says he did not know anything about it but is open to going to ortho.  Pt denies any redness or warmth of the knee.  Notified pt of tramadol rx and Dr. Synthia Innocent message.  Pt verbalizes understanding.  Fyi to Dr. Darnell Level.

## 2019-02-20 NOTE — Addendum Note (Signed)
Addended by: Ria Bush on: 02/20/2019 02:58 PM   Modules accepted: Orders

## 2019-02-20 NOTE — Telephone Encounter (Signed)
Pt called stating his left knee is very painful yesterday and today.  He stated he cannot walk he is  rolling around the house in a chair. He stated he took his last naproxen today.  He stated he is taking 2 ibuprofen with naproxen with temp relief.  He is wanting to know if you could call him in something stronger.  He stated the pain is really bad today. offered appointment pt declined   [facility] [address]   Please respond through my chart

## 2019-02-20 NOTE — Telephone Encounter (Addendum)
Seen back 09/2018 for knee pain with concern for meniscal injury - referred to ortho. Did he go and what did ortho say?  Any redness or warmth of knee? If so needs evaluation.  I have sent in tramadol to try - use sparingly, caution with sedation. Will need eval here or with ortho as this isn't treating underlying cause but just trying to control pain.

## 2019-02-21 NOTE — Telephone Encounter (Signed)
New referral placed. Apparently pt didn't go to the 10/2018 appt we scheduled for him.

## 2019-02-21 NOTE — Addendum Note (Signed)
Addended by: Ria Bush on: 02/21/2019 07:53 AM   Modules accepted: Orders

## 2019-02-22 ENCOUNTER — Other Ambulatory Visit: Payer: 59

## 2019-02-22 NOTE — Telephone Encounter (Signed)
New Ortho Appt set up with Dr Mack Guise on 03/04/19 at 10:15am, patient notified

## 2019-02-25 ENCOUNTER — Telehealth: Payer: Self-pay | Admitting: *Deleted

## 2019-02-25 NOTE — Telephone Encounter (Signed)
Pt requiring PA for Jardiance 10 mg tablet qd. PA has been faxed through [facility] [phone]. Awaiting approval decision.

## 2019-02-27 NOTE — Telephone Encounter (Signed)
Per fax from [facility] [employer], Jardiance 10mg  tablets has been approved effective 01/27/2019 through 02/26/2020. Patient has been informed.

## 2019-03-01 ENCOUNTER — Encounter: Payer: 59 | Admitting: Family Medicine

## 2019-03-13 NOTE — Telephone Encounter (Signed)
PA approved.

## 2019-03-27 ENCOUNTER — Ambulatory Visit (INDEPENDENT_AMBULATORY_CARE_PROVIDER_SITE_OTHER): Payer: 59 | Admitting: Family Medicine

## 2019-03-27 ENCOUNTER — Encounter: Payer: Self-pay | Admitting: Family Medicine

## 2019-03-27 ENCOUNTER — Other Ambulatory Visit: Payer: Self-pay

## 2019-03-27 VITALS — BP 124/70 | HR 70 | Temp 97.9°F | Wt 372.2 lb

## 2019-03-27 DIAGNOSIS — N492 Inflammatory disorders of scrotum: Secondary | ICD-10-CM | POA: Insufficient documentation

## 2019-03-27 DIAGNOSIS — IMO0002 Reserved for concepts with insufficient information to code with codable children: Secondary | ICD-10-CM

## 2019-03-27 DIAGNOSIS — N481 Balanitis: Secondary | ICD-10-CM

## 2019-03-27 DIAGNOSIS — Z6841 Body Mass Index (BMI) 40.0 and over, adult: Secondary | ICD-10-CM

## 2019-03-27 DIAGNOSIS — E1165 Type 2 diabetes mellitus with hyperglycemia: Secondary | ICD-10-CM

## 2019-03-27 DIAGNOSIS — E118 Type 2 diabetes mellitus with unspecified complications: Secondary | ICD-10-CM | POA: Diagnosis not present

## 2019-03-27 DIAGNOSIS — L732 Hidradenitis suppurativa: Secondary | ICD-10-CM

## 2019-03-27 HISTORY — DX: Balanitis: N48.1

## 2019-03-27 HISTORY — DX: Inflammatory disorders of scrotum: N49.2

## 2019-03-27 MED ORDER — DOXYCYCLINE HYCLATE 100 MG PO TABS: 100.0000 mg | ORAL_TABLET | Freq: Two times a day (BID) | ORAL | 0 refills | Status: DC

## 2019-03-27 MED ORDER — METFORMIN HCL ER 750 MG PO TB24: 1500.0000 mg | ORAL_TABLET | Freq: Every day | ORAL | 1 refills | Status: DC

## 2019-03-27 MED ORDER — PANTOPRAZOLE SODIUM 20 MG PO TBEC: 20.0000 mg | DELAYED_RELEASE_TABLET | Freq: Every day | ORAL | 3 refills | Status: DC | PRN

## 2019-03-27 MED ORDER — OZEMPIC (0.25 OR 0.5 MG/DOSE) 2 MG/1.5ML ~~LOC~~ SOPN: PEN_INJECTOR | SUBCUTANEOUS | 0 refills | Status: AC

## 2019-03-27 NOTE — Assessment & Plan Note (Signed)
Discussed relation of weight to hydradenitis.

## 2019-03-27 NOTE — Assessment & Plan Note (Addendum)
Chronically poorly controlled. Forgets PM metformin dose. Did not start jardiance - advised not to fill this given recurrent boils. Will change to metformin XR 750mg  to take 2 in am (once daily dosing). He declines starting insulin. Will price out ozempic. Reviewed mechanism of action of weekly GLP1 RA and cardiovascular and weight loss benefits.

## 2019-03-27 NOTE — Assessment & Plan Note (Addendum)
This has largely resolved with topical clotrimazole treatment.

## 2019-03-27 NOTE — Patient Instructions (Addendum)
For diabetes - don't take jardiance. Price out ozempic once weekly shot - if affordable, start 0.25mg  weekly for 2 weeks, then increase to 0.5mg  weekly. Transition to metformin XR 750mg  2 tablets in the morning instead of your immediate release metformin 850mg .   Start doxycycline twice daily for 7 days, update me with effect.  You likely have hydradenitis suppurativa - skin condition that increases chance of recurrent boils. Warm compresses to scrotum, elevate scrotum as able.

## 2019-03-27 NOTE — Progress Notes (Signed)
This visit was conducted in person.  BP 124/70 (BP Location: Left Arm, Patient Position: Sitting, Cuff Size: Normal)   Pulse 70   Temp 97.9 F (36.6 C) (Temporal)   Wt (!) 372 lb 3.2 oz (168.8 kg)   SpO2 98%   BMI 51.91 kg/m    CC: f/u groin lesion Subjective:    Patient ID: Alvin Phillips, male    DOB: [birth year], 39 y.o.   MRN: [id number]  HPI: Alvin Phillips is a 39 y.o. male presenting on 03/27/2019 for genital pain   Seen 2 months ago with balanitis and genital warts, treated with clotrimazole cream with benefit.   Last night noticed uncomfortable knot at bottom of scrotum.  H/o boils to thighs in the past.   DM - we discussed bariatric surgery - he has not attended seminar. He has not been checking sugars - working on getting new machine. Continues metformin BID (sometimes misses PM dose) - has not filled jardiance yet.      Relevant past medical, surgical, family and social history reviewed and updated as indicated. Interim medical history since our last visit reviewed. Allergies and medications reviewed and updated. Outpatient Medications Prior to Visit  Medication Sig Dispense Refill  . clotrimazole (LOTRIMIN) 1 % cream Apply 1 application topically 2 (two) times daily. 80 g 1  . furosemide (LASIX) 40 MG tablet Take 1 tablet (40 mg total) by mouth daily. 30 tablet 5  . glucose blood (BAYER CONTOUR NEXT TEST) test strip Use as instructed 100 each 5  . ibuprofen (ADVIL,MOTRIN) 200 MG tablet Take 200 mg by mouth as needed for moderate pain.     . Lancets (ONETOUCH ULTRASOFT) lancets Use as instructed 100 each 5  . sacubitril-valsartan (ENTRESTO) 97-103 MG Take 1 tablet by mouth 2 (two) times daily. 60 tablet 11  . sildenafil (VIAGRA) 50 MG tablet Take 1 tablet (50 mg total) by mouth daily as needed for erectile dysfunction. 20 tablet 0  . sotalol (BETAPACE) 160 MG tablet TAKE 1 TABLET BY MOUTH EVERY 12 HOURS 60 tablet 1  . empagliflozin (JARDIANCE) 10 MG TABS tablet Take 10  mg by mouth daily. 30 tablet 11  . metFORMIN (GLUCOPHAGE) 850 MG tablet Take 1 tablet (850 mg total) by mouth 2 (two) times daily with a meal. 180 tablet 1  . naproxen (NAPROSYN) 500 MG tablet Take one po bid x 1 week then prn pain, take with food 40 tablet 0  . pantoprazole (PROTONIX) 20 MG tablet Take 1 tablet (20 mg total) by mouth daily as needed for heartburn or indigestion. 30 tablet 1  . meloxicam (MOBIC) 15 MG tablet Take 1 tablet (15 mg total) by mouth daily.     No facility-administered medications prior to visit.     Per HPI unless specifically indicated in ROS section below Review of Systems Objective:    BP 124/70 (BP Location: Left Arm, Patient Position: Sitting, Cuff Size: Normal)   Pulse 70   Temp 97.9 F (36.6 C) (Temporal)   Wt (!) 372 lb 3.2 oz (168.8 kg)   SpO2 98%   BMI 51.91 kg/m   Wt Readings from Last 3 Encounters:  03/27/19 (!) 372 lb 3.2 oz (168.8 kg)  02/08/19 (!) 367 lb (166.5 kg)  01/29/19 (!) 373 lb 1 oz (169.2 kg)    Physical Exam Vitals and nursing note reviewed.  Constitutional:      Appearance: Normal appearance. He is obese. He is not ill-appearing.  Abdominal:  Hernia: There is no hernia in the left inguinal area or right inguinal area.  Genitourinary:    Penis: Normal.      Testes: Normal.        Right: Mass or tenderness not present.        Left: Mass or tenderness not present.     Comments:  Verrucous growths ventral penis at base of glans L indurated scrotal mass near thigh with evidence of several small boils on skin Lymphadenopathy:     Lower Body: No right inguinal adenopathy. No left inguinal adenopathy.  Neurological:     Mental Status: He is alert.       Results for orders placed or performed in visit on 01/29/19  POCT A1c  Result Value Ref Range   Hemoglobin A1C 14.2 (A) 4.0 - 5.6 %   HbA1c POC (<> result, manual entry)     HbA1c, POC (prediabetic range)     HbA1c, POC (controlled diabetic range)     Assessment &  Plan:  This visit occurred during the SARS-CoV-2 public health emergency.  Safety protocols were in place, including screening questions prior to the visit, additional usage of staff PPE, and extensive cleaning of exam room while observing appropriate contact time as indicated for disinfecting solutions.   Problem List Items Addressed This Visit    Morbid obesity with BMI of 50.0-59.9, adult ([place])    Discussed relation of weight to hydradenitis.       Relevant Medications   Semaglutide,0.25 or 0.5MG /DOS, (OZEMPIC, 0.25 OR 0.5 MG/DOSE,) 2 MG/1.5ML SOPN   metFORMIN (GLUCOPHAGE XR) 750 MG 24 hr tablet   Hidradenitis suppurativa    Reviewed increased risk due to diabetes and morbid obesity.       Diabetes mellitus type 2, uncontrolled, with complications ([place])    Chronically poorly controlled. Forgets PM metformin dose. Did not start jardiance - advised not to fill this given recurrent boils. Will change to metformin XR 750mg  to take 2 in am (once daily dosing). He declines starting insulin. Will price out ozempic. Reviewed mechanism of action of weekly GLP1 RA and cardiovascular and weight loss benefits.       Relevant Medications   Semaglutide,0.25 or 0.5MG /DOS, (OZEMPIC, 0.25 OR 0.5 MG/DOSE,) 2 MG/1.5ML SOPN   metFORMIN (GLUCOPHAGE XR) 750 MG 24 hr tablet   Carbuncle of scrotum - Primary    Unable to express enough material to culture. Treat with oral doxycycline course, regular warm compresses, scrotal elevation, discussed possible need for I&D - if needed would refer to gen surgery.  Reviewed uncontrolled diabetes contributing to recurrent boils.       Balanitis    This has largely resolved with topical clotrimazole treatment.           Meds ordered this encounter  Medications  . Semaglutide,0.25 or 0.5MG /DOS, (OZEMPIC, 0.25 OR 0.5 MG/DOSE,) 2 MG/1.5ML SOPN    Sig: Inject 0.25 mg into the skin once a week for 14 days, THEN 0.5 mg once a week.    Dispense:  1 pen    Refill:  0   . pantoprazole (PROTONIX) 20 MG tablet    Sig: Take 1 tablet (20 mg total) by mouth daily as needed for heartburn or indigestion.    Dispense:  30 tablet    Refill:  3  . doxycycline (VIBRA-TABS) 100 MG tablet    Sig: Take 1 tablet (100 mg total) by mouth 2 (two) times daily.    Dispense:  14 tablet  Refill:  0  . metFORMIN (GLUCOPHAGE XR) 750 MG 24 hr tablet    Sig: Take 2 tablets (1,500 mg total) by mouth daily with breakfast.    Dispense:  180 tablet    Refill:  1   No orders of the defined types were placed in this encounter.   Patient Instructions  For diabetes - don't take jardiance. Price out ozempic once weekly shot - if affordable, start 0.25mg  weekly for 2 weeks, then increase to 0.5mg  weekly. Transition to metformin XR 750mg  2 tablets in the morning instead of your immediate release metformin 850mg .   Start doxycycline twice daily for 7 days, update me with effect.  You likely have hydradenitis suppurativa - skin condition that increases chance of recurrent boils. Warm compresses to scrotum, elevate scrotum as able.    Follow up plan: Return if symptoms worsen or fail to improve.  , MD

## 2019-03-27 NOTE — Assessment & Plan Note (Signed)
Reviewed increased risk due to diabetes and morbid obesity.

## 2019-03-27 NOTE — Assessment & Plan Note (Signed)
Unable to express enough material to culture. Treat with oral doxycycline course, regular warm compresses, scrotal elevation, discussed possible need for I&D - if needed would refer to gen surgery.  Reviewed uncontrolled diabetes contributing to recurrent boils.

## 2019-04-15 ENCOUNTER — Telehealth: Payer: Self-pay | Admitting: Family Medicine

## 2019-04-15 DIAGNOSIS — N492 Inflammatory disorders of scrotum: Secondary | ICD-10-CM

## 2019-04-15 NOTE — Telephone Encounter (Addendum)
Referral placed to urology office.  Did he start ozempic? Better sugar control would help prevent recurrent boils.  What dates was he out of work and when did he return to work?

## 2019-04-15 NOTE — Telephone Encounter (Signed)
Pt called stating he has another boil. He said the medicine didn't work. He is wondering if he can be referred somewhere for this. He states he will also be faxing over FMLA ppw.

## 2019-04-16 NOTE — Telephone Encounter (Signed)
Urology Appointment scheduled for 04/23/19 with [facility] and patient notified.

## 2019-04-16 NOTE — Telephone Encounter (Signed)
FMLA paperwork on Alvin Phillips's desk Waiting for pt to call back with dates

## 2019-04-16 NOTE — Telephone Encounter (Addendum)
Spoke with pt relaying Dr. Timoteo Expose message.  Pt verbalizes understanding about referral.  States he has not started Ozempic yet but he will.  Also, pt will call back with the dates involving work.  FYI to Dr. Reece Agar.

## 2019-04-18 NOTE — Telephone Encounter (Signed)
Left message asking pt to call office regarding fmla  See robin 

## 2019-04-22 NOTE — Telephone Encounter (Signed)
Spoke with pt he wanted start date 03/22/19 It was for boil he has urology appointment 2/2  fmla paperwork in dr g in box for review and signature

## 2019-04-26 NOTE — Telephone Encounter (Signed)
Ok - will fill out new forms.

## 2019-04-26 NOTE — Telephone Encounter (Addendum)
FMLA filled out and in my out box.  We did not discuss prolonged period out of work when I saw him 03/27/2019. He needed to have checked with me about this prior to just staying out of work.  I will write him out through today. He needs to schedule appt on Monday for re evaluation.

## 2019-04-26 NOTE — Telephone Encounter (Signed)
Tried called pt no voice mail sent my chart message asking pt to call office

## 2019-04-26 NOTE — Telephone Encounter (Signed)
Spoke with pt he is out of town but made appointment for 2/9 Tuesday  I let him know you wrote him out till today.  He stated he has not be out of work  He wanted this for dr appointment.  He stated it was like last time.  The FMLA paperwork I could find was from 2019 for arrhythmia. He stated it was for other things also. HE didn't want to go into over the phone and would talk to you about it at the appointment on Tuesday  I have paperwork at my desk

## 2019-04-29 NOTE — Telephone Encounter (Signed)
Dr g has paperwork

## 2019-04-30 ENCOUNTER — Ambulatory Visit: Payer: 59 | Admitting: Family Medicine

## 2019-04-30 DIAGNOSIS — Z0279 Encounter for issue of other medical certificate: Secondary | ICD-10-CM

## 2019-04-30 DIAGNOSIS — Z0289 Encounter for other administrative examinations: Secondary | ICD-10-CM

## 2019-05-10 NOTE — Progress Notes (Deleted)
Follow-up Outpatient Visit Date: 05/13/2019  Primary Care Provider: Eustaquio Boyden, MD [address] [place] [address]  Chief Complaint: ***  HPI:  Alvin Phillips is a 39 y.o. male with history of Wolff-Parkinson-White syndrome,chronic systolic heart failure due to NICM,palpitations, hypertension, uncontrolled diabetes mellitus, morbid obesity, GERD, and sleep apnea, who presents for follow-up of heart failure.  I last saw Alvin Phillips in 01/2019, at which time he was doing relatively well, though he had gained quite a bit of weight during the COVID-19 pandemic.  He noted 2 episodes of palpitations lasting a few minutes each.  We added empagliflozin for improved diabetes control and for heart failure therapy.  He never started this medication and was ultimately advised to not take it by Alvin Phillips due to recurrent boils.  He was started on semaglutide instead.  --------------------------------------------------------------------------------------------------  Cardiovascular History & Procedures: Cardiovascular Problems:  Chest pain  Shortness of breath  WPW  Risk Factors:  Hypertension, diabetes mellitus, male gender, and morbid obesity  Cath/PCI:  R/LHC (12/18/2017): Normal coronary arteries. Severely elevated left and right heart filling pressures. Moderately reduced Fick cardiac output/index.  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  Non-Invasive Evaluation(s):  TTE (02/22/2018): Moderately dilated LV with mild LVH. LVEF 30-35% with global hypokinesis. Mild LAE. Normal RV size and function. Mild to moderate pulmonary hypertension.  TTE (12/15/2017): Severely dilated LV with mild LVH. LVEF 20-25% with global hypokinesis. Mild MR. Mild LAE. Mildly reduced RV function. Mild pulmonary  hypertension.   Recent CV Pertinent Labs: Lab Results  Component Value Date   CHOL 163 12/17/2017   HDL 27 (L) 12/17/2017   LDLCALC 93 12/17/2017   LDLDIRECT 110.0 12/12/2017   TRIG 215 (H) 12/17/2017   CHOLHDL 6.0 12/17/2017   INR 0.97 12/17/2017   BNP 234.0 (H) 12/14/2017   K 3.8 12/05/2018   K 3.6 12/10/2013   MG 1.8 12/25/2017   BUN 15 12/05/2018   BUN 16 01/01/2018   BUN 11 12/10/2013   CREATININE 0.71 12/05/2018   CREATININE 0.97 12/10/2013    Past medical and surgical history were reviewed and updated in EPIC.  No outpatient medications have been marked as taking for the 05/13/19 encounter (Appointment) with [name], Cristal Deer, MD.    Allergies: Patient has no known allergies.  Social History   Tobacco Use  . Smoking status: Former Smoker    Packs/day: 0.25    Years: 15.00    Pack years: 3.75    Types: Cigarettes    Quit date: 2019    Years since quitting: 2.1  . Smokeless tobacco: Never Used  Substance Use Topics  . Alcohol use: Yes    Alcohol/week: 0.0 standard drinks    Comment: Occassional beer, shots of liquor  . Drug use: Yes    Frequency: 2.0 times per week    Types: Marijuana    Comment: 1 time per week; none in 4-6 months.    Family History  Problem Relation Age of Onset  . Diabetes Maternal Grandmother   . Heart attack Maternal Grandmother 80  . Diabetes Paternal Grandfather     Review of Systems: A 12-system review of systems was performed and was negative except as noted in the HPI.  --------------------------------------------------------------------------------------------------  Physical Exam: There were no vitals taken for this visit.  General:  *** HEENT: No conjunctival pallor  or scleral icterus. Facemask in place. Neck: Supple without lymphadenopathy, thyromegaly, JVD, or HJR. Lungs: Normal work of breathing. Clear to auscultation bilaterally without wheezes or crackles. Heart: Regular rate and rhythm without murmurs,  rubs, or gallops. Non-displaced PMI. Abd: Bowel sounds present. Soft, NT/ND without hepatosplenomegaly Ext: No lower extremity edema. Radial, PT, and DP pulses are 2+ bilaterally. Skin: Warm and dry without rash.  EKG:  ***  Lab Results  Component Value Date   WBC 6.4 12/05/2018   HGB 12.7 (L) 12/05/2018   HCT 39.1 12/05/2018   MCV 90.1 12/05/2018   PLT 315 12/05/2018    Lab Results  Component Value Date   NA 136 12/05/2018   K 3.8 12/05/2018   CL 97 (L) 12/05/2018   CO2 28 12/05/2018   BUN 15 12/05/2018   CREATININE 0.71 12/05/2018   GLUCOSE 279 (H) 12/05/2018   ALT 57 (H) 12/14/2017    Lab Results  Component Value Date   CHOL 163 12/17/2017   HDL 27 (L) 12/17/2017   LDLCALC 93 12/17/2017   LDLDIRECT 110.0 12/12/2017   TRIG 215 (H) 12/17/2017   CHOLHDL 6.0 12/17/2017    --------------------------------------------------------------------------------------------------  ASSESSMENT AND PLAN: Alvin Gave Montrice Gracey, MD 05/10/2019 10:15 AM

## 2019-05-13 ENCOUNTER — Ambulatory Visit: Payer: 59 | Admitting: Internal Medicine

## 2019-05-15 ENCOUNTER — Encounter: Payer: Self-pay | Admitting: Internal Medicine

## 2019-05-15 NOTE — Telephone Encounter (Addendum)
Patient missed appt earlier this month for DM f/u and to review FMLA forms - I have filled out forms but want him to schedule OV as overdue for f/u.  I will have FMLA forms in my out box tomorrow.

## 2019-05-15 NOTE — Telephone Encounter (Signed)
Tried calling pt no voicemail °

## 2019-05-16 NOTE — Telephone Encounter (Signed)
Paperwork faxed °

## 2019-05-16 NOTE — Telephone Encounter (Addendum)
Spoke with pt relaying Dr. Timoteo Expose message.  Verbalizes understanding.  Scheduled OV on 05/22/19 at 1:00.

## 2019-05-20 NOTE — Telephone Encounter (Signed)
Paperwork faxed 2/25 Copy for pt  copyf or scan Copy for billing

## 2019-05-22 ENCOUNTER — Ambulatory Visit (INDEPENDENT_AMBULATORY_CARE_PROVIDER_SITE_OTHER): Payer: 59 | Admitting: Family Medicine

## 2019-05-22 ENCOUNTER — Encounter: Payer: Self-pay | Admitting: Family Medicine

## 2019-05-22 ENCOUNTER — Other Ambulatory Visit: Payer: Self-pay

## 2019-05-22 VITALS — BP 124/80 | HR 83 | Temp 97.8°F | Ht 70.0 in | Wt 368.0 lb

## 2019-05-22 DIAGNOSIS — I428 Other cardiomyopathies: Secondary | ICD-10-CM

## 2019-05-22 DIAGNOSIS — E118 Type 2 diabetes mellitus with unspecified complications: Secondary | ICD-10-CM

## 2019-05-22 DIAGNOSIS — I10 Essential (primary) hypertension: Secondary | ICD-10-CM | POA: Diagnosis not present

## 2019-05-22 DIAGNOSIS — I5020 Unspecified systolic (congestive) heart failure: Secondary | ICD-10-CM

## 2019-05-22 DIAGNOSIS — E1165 Type 2 diabetes mellitus with hyperglycemia: Secondary | ICD-10-CM

## 2019-05-22 DIAGNOSIS — Z6841 Body Mass Index (BMI) 40.0 and over, adult: Secondary | ICD-10-CM

## 2019-05-22 DIAGNOSIS — I456 Pre-excitation syndrome: Secondary | ICD-10-CM

## 2019-05-22 DIAGNOSIS — IMO0002 Reserved for concepts with insufficient information to code with codable children: Secondary | ICD-10-CM

## 2019-05-22 LAB — POCT GLYCOSYLATED HEMOGLOBIN (HGB A1C): Hemoglobin A1C: 12.1 % — AB (ref 4.0–5.6)

## 2019-05-22 MED ORDER — METFORMIN HCL ER 750 MG PO TB24: 750.0000 mg | ORAL_TABLET | Freq: Every day | ORAL | 1 refills | Status: DC

## 2019-05-22 MED ORDER — OZEMPIC (0.25 OR 0.5 MG/DOSE) 2 MG/1.5ML ~~LOC~~ SOPN: PEN_INJECTOR | SUBCUTANEOUS | 3 refills | Status: AC

## 2019-05-22 NOTE — Assessment & Plan Note (Addendum)
He stopped metformin several months ago. Had trouble tolerating 1500mg  daily, did not let me know.  Again reviewed importance of adherence to treatment plan, reviewed risks of uncontrolled diabetes including recurrent skin infection, heart attack and stroke. He agrees to restart metformin XR at 750mg  once daily. Will also start ozempic weekly shot. He states he's motivated to follow treatment plan for 3 months and reassess RTC 3 mo f/u visit.  Advised him to call and schedule eye exam.

## 2019-05-22 NOTE — Assessment & Plan Note (Signed)
rec restart sotalol and f/u with cards.

## 2019-05-22 NOTE — Progress Notes (Signed)
This visit was conducted in person.  BP 124/80 (BP Location: Right Arm, Patient Position: Sitting, Cuff Size: Large)   Pulse 83   Temp 97.8 F (36.6 C) (Temporal)   Ht 5\' 10"  (1.778 m)   Wt (!) 368 lb (166.9 kg)   SpO2 97%   BMI 52.80 kg/m    CC: DM f/u visit Subjective:    Patient ID: Alvin Phillips, male    DOB: Mar 16, 1981, 39 y.o.   MRN: [id number]  HPI: Alvin Phillips is a 39 y.o. male presenting on 05/22/2019 for Diabetes (Here for f/u. ) and Form Completion (Needs FMLA forms completed. )   Rough week - death in the family. He has not taken metformin for the past 2 weeks. Has actually not taken any medicines in the past 2 weeks. Trying to go vegan - didn't do so well this past week.   He is trying to walk regularly and knees are tolerating this.   DM - does not  regularly check sugars. Compliant with antihyperglycemic regimen which includes: metformin XR 1500mg  daily - tried it for a few weeks then stopped. Never tried ozempic. Denies low sugars or hypoglycemic symptoms. Denies paresthesias. Last diabetic eye exam DUE. Pneumovax: DUE. Prevnar: not due. Glucometer brand: thinks it's one-touch - battery doesn't work - he states he will go buy battery today. DSME: has not completed - states unaffordable.  Lab Results  Component Value Date   HGBA1C 12.1 (A) 05/22/2019   Diabetic Foot Exam - Simple   No data filed     Lab Results  Component Value Date   MICROALBUR 29.6 (H) 02/24/2016     FMLA forms previously filled out - he requested for doctor appointments.      Relevant past medical, surgical, family and social history reviewed and updated as indicated. Interim medical history since our last visit reviewed. Allergies and medications reviewed and updated. Outpatient Medications Prior to Visit  Medication Sig Dispense Refill  . clotrimazole (LOTRIMIN) 1 % cream Apply 1 application topically 2 (two) times daily. (Patient taking differently: Apply 1 application topically 2  (two) times daily. As needed) 80 g 1  . traMADol (ULTRAM) 50 MG tablet Take 50 mg by mouth every 6 (six) hours.    . furosemide (LASIX) 40 MG tablet Take 1 tablet (40 mg total) by mouth daily. (Patient not taking: Reported on 05/22/2019) 30 tablet 5  . glucose blood (BAYER CONTOUR NEXT TEST) test strip Use as instructed (Patient not taking: Reported on 05/22/2019) 100 each 5  . ibuprofen (ADVIL,MOTRIN) 200 MG tablet Take 200 mg by mouth as needed for moderate pain.     . Lancets (ONETOUCH ULTRASOFT) lancets Use as instructed (Patient not taking: Reported on 05/22/2019) 100 each 5  . pantoprazole (PROTONIX) 20 MG tablet Take 1 tablet (20 mg total) by mouth daily as needed for heartburn or indigestion. (Patient not taking: Reported on 05/22/2019) 30 tablet 3  . sacubitril-valsartan (ENTRESTO) 97-103 MG Take 1 tablet by mouth 2 (two) times daily. (Patient not taking: Reported on 05/22/2019) 60 tablet 11  . sildenafil (VIAGRA) 50 MG tablet Take 1 tablet (50 mg total) by mouth daily as needed for erectile dysfunction. (Patient not taking: Reported on 05/22/2019) 20 tablet 0  . sotalol (BETAPACE) 160 MG tablet TAKE 1 TABLET BY MOUTH EVERY 12 HOURS (Patient not taking: Reported on 05/22/2019) 60 tablet 1  . doxycycline (VIBRA-TABS) 100 MG tablet Take 1 tablet (100 mg total) by mouth 2 (two)  times daily. 14 tablet 0  . meloxicam (MOBIC) 15 MG tablet Take 1 tablet (15 mg total) by mouth daily.    . meloxicam (MOBIC) 7.5 MG tablet Take 7.5 mg by mouth 2 (two) times daily.    . metFORMIN (GLUCOPHAGE XR) 750 MG 24 hr tablet Take 2 tablets (1,500 mg total) by mouth daily with breakfast. (Patient not taking: Reported on 05/22/2019) 180 tablet 1   No facility-administered medications prior to visit.     Per HPI unless specifically indicated in ROS section below Review of Systems Objective:    BP 124/80 (BP Location: Right Arm, Patient Position: Sitting, Cuff Size: Large)   Pulse 83   Temp 97.8 F (36.6 C) (Temporal)   Ht  5\' 10"  (1.778 m)   Wt (!) 368 lb (166.9 kg)   SpO2 97%   BMI 52.80 kg/m   Wt Readings from Last 3 Encounters:  05/22/19 (!) 368 lb (166.9 kg)  03/27/19 (!) 372 lb 3.2 oz (168.8 kg)  02/08/19 (!) 367 lb (166.5 kg)    Physical Exam Vitals and nursing note reviewed.  Constitutional:      Appearance: Normal appearance. He is obese. He is not ill-appearing.  Cardiovascular:     Rate and Rhythm: Normal rate and regular rhythm.     Pulses: Normal pulses.     Heart sounds: Normal heart sounds. No murmur.  Pulmonary:     Effort: Pulmonary effort is normal. No respiratory distress.     Breath sounds: Normal breath sounds. No wheezing, rhonchi or rales.  Musculoskeletal:     Right lower leg: No edema.     Left lower leg: No edema.  Neurological:     Mental Status: He is alert.       Results for orders placed or performed in visit on 05/22/19  POCT glycosylated hemoglobin (Hb A1C)  Result Value Ref Range   Hemoglobin A1C 12.1 (A) 4.0 - 5.6 %   HbA1c POC (<> result, manual entry)     HbA1c, POC (prediabetic range)     HbA1c, POC (controlled diabetic range)     Assessment & Plan:  This visit occurred during the SARS-CoV-2 public health emergency.  Safety protocols were in place, including screening questions prior to the visit, additional usage of staff PPE, and extensive cleaning of exam room while observing appropriate contact time as indicated for disinfecting solutions.   Problem List Items Addressed This Visit    WPW (Wolff-Parkinson-White syndrome)    rec restart sotalol and f/u with cards.       Nonischemic cardiomyopathy (HCC)   Morbid obesity with BMI of 50.0-59.9, adult (HCC)    Aware of relation of weight to recurrent groin infections and diabetes. He is motivated to lose weight, wants to start vegan diet. He states he is walking regularly.       Relevant Medications   Semaglutide,0.25 or 0.5MG /DOS, (OZEMPIC, 0.25 OR 0.5 MG/DOSE,) 2 MG/1.5ML SOPN   metFORMIN  (GLUCOPHAGE XR) 750 MG 24 hr tablet   HFrEF (heart failure with reduced ejection fraction) (HCC)    Off meds for about a month. rec restart entresto and f/u with cards.       Essential hypertension    Actually stable off medication. Suggested he use lasix PRN leg swelling or signs of fluid overload      Diabetes mellitus type 2, uncontrolled, with complications (HCC) - Primary    He stopped metformin several months ago. Had trouble tolerating 1500mg  daily, did not  let me know.  Again reviewed importance of adherence to treatment plan, reviewed risks of uncontrolled diabetes including recurrent skin infection, heart attack and stroke. He agrees to restart metformin XR at 750mg  once daily. Will also start ozempic weekly shot. He states he's motivated to follow treatment plan for 3 months and reassess RTC 3 mo f/u visit.  Advised him to call and schedule eye exam.       Relevant Medications   Semaglutide,0.25 or 0.5MG /DOS, (OZEMPIC, 0.25 OR 0.5 MG/DOSE,) 2 MG/1.5ML SOPN   metFORMIN (GLUCOPHAGE XR) 750 MG 24 hr tablet   Other Relevant Orders   POCT glycosylated hemoglobin (Hb A1C) (Completed)       Meds ordered this encounter  Medications  . Semaglutide,0.25 or 0.5MG /DOS, (OZEMPIC, 0.25 OR 0.5 MG/DOSE,) 2 MG/1.5ML SOPN    Sig: Inject 0.25 mg into the skin once a week for 14 days, THEN 0.5 mg once a week.    Dispense:  1 pen    Refill:  3  . metFORMIN (GLUCOPHAGE XR) 750 MG 24 hr tablet    Sig: Take 1 tablet (750 mg total) by mouth daily with breakfast.    Dispense:  90 tablet    Refill:  1   Orders Placed This Encounter  Procedures  . POCT glycosylated hemoglobin (Hb A1C)    Patient Instructions  Restart metformin XR 750mg  once daily Price out and start ozempic 0.25mg  weekly shot, after 2 weeks increase to 0.5mg  weekly. [employer] ozempic coupon for possible cost savings.  Restart entresto, sotalol. Try furosemide as needed for leg swelling. Call to schedule appointment with Dr  End.  Return in 3 months for diabetes follow up visit.    Follow up plan: Return in about 3 months (around 08/22/2019) for follow up visit.  , MD

## 2019-05-22 NOTE — Patient Instructions (Addendum)
Restart metformin XR 750mg  once daily Price out and start ozempic 0.25mg  weekly shot, after 2 weeks increase to 0.5mg  weekly. [employer] ozempic coupon for possible cost savings.  Restart entresto, sotalol. Try furosemide as needed for leg swelling. Call to schedule appointment with Dr End.  Return in 3 months for diabetes follow up visit.

## 2019-05-22 NOTE — Assessment & Plan Note (Signed)
Aware of relation of weight to recurrent groin infections and diabetes. He is motivated to lose weight, wants to start vegan diet. He states he is walking regularly.

## 2019-05-22 NOTE — Assessment & Plan Note (Signed)
Off meds for about a month. rec restart entresto and f/u with cards.

## 2019-05-22 NOTE — Assessment & Plan Note (Addendum)
Actually stable off medication. Suggested he use lasix PRN leg swelling or signs of fluid overload

## 2019-08-23 ENCOUNTER — Ambulatory Visit: Payer: 59 | Admitting: Family Medicine

## 2019-08-26 ENCOUNTER — Ambulatory Visit: Payer: 59 | Admitting: Family Medicine

## 2019-08-26 DIAGNOSIS — Z0289 Encounter for other administrative examinations: Secondary | ICD-10-CM

## 2019-10-08 ENCOUNTER — Encounter: Payer: Self-pay | Admitting: Primary Care

## 2019-10-08 ENCOUNTER — Ambulatory Visit (INDEPENDENT_AMBULATORY_CARE_PROVIDER_SITE_OTHER): Payer: Self-pay | Admitting: Primary Care

## 2019-10-08 ENCOUNTER — Other Ambulatory Visit: Payer: Self-pay

## 2019-10-08 VITALS — BP 134/84 | HR 82 | Temp 96.0°F | Ht 70.0 in | Wt 359.5 lb

## 2019-10-08 DIAGNOSIS — L02429 Furuncle of limb, unspecified: Secondary | ICD-10-CM | POA: Insufficient documentation

## 2019-10-08 MED ORDER — SULFAMETHOXAZOLE-TRIMETHOPRIM 800-160 MG PO TABS: 1.0000 | ORAL_TABLET | Freq: Two times a day (BID) | ORAL | 0 refills | Status: DC

## 2019-10-08 NOTE — Patient Instructions (Addendum)
Start Bactrim DS (sulfamethoxazole/trimethoprim) tablets for urinary tract infection. Take 1 tablet by mouth twice daily for 10 days.  Schedule a follow up visit with Dr. Sharen Hones for this Friday.  It was a pleasure meeting you!

## 2019-10-08 NOTE — Assessment & Plan Note (Addendum)
Acute for the last five days, fluctuant today.  Patient consented to I&D.  Site cleansed with betadine solution. Lidocaine 1% with epi used for analgesia.  Site lanced with 11 blade. Moderate amounts of whitish, bloody fluid draining from site with significant decrease in size of boil. Site cleansed again with betadine solution. Dressing applied. Patient tolerated well.  Culture obtained. Rx for Bactrim DS course sent to pharmacy. Discussed to follow up with PCP in 3 days.

## 2019-10-08 NOTE — Addendum Note (Signed)
Addended by: Tawnya Crook on: 10/08/2019 11:20 AM   Modules accepted: Orders

## 2019-10-08 NOTE — Progress Notes (Signed)
Subjective:    Patient ID: Alvin Phillips, male    DOB: [birth year], 39 y.o.   MRN: [id number]  HPI  This visit occurred during the SARS-CoV-2 public health emergency.  Safety protocols were in place, including screening questions prior to the visit, additional usage of staff PPE, and extensive cleaning of exam room while observing appropriate contact time as indicated for disinfecting solutions.   Alvin Phillips is a 39 year old male patient of Dr. Sharen Hones with a history of WPW, hypertension, CHF, OSA, type 2 diabetes, hidradenitis suppurativa, skin ulcers who presents today with a chief complaint of boil.  His boil is located to the left upper medial thigh which he first noticed about five days ago. The boil is painful, especially with ambulation. He endorses a history of recurrent boils over the years which will typically rupture on their own, but this one has not come to a head or ruptured.   He's tried exfoliating twice to the area of the boil, but he's not been able to rupture the site. He's not tried applying warm compresses. He denies fevers.    BP Readings from Last 3 Encounters:  10/08/19 134/84  05/22/19 124/80  03/27/19 124/70     Review of Systems  Constitutional: Negative for fever.  Skin: Positive for color change and wound.       Past Medical History:  Diagnosis Date  . CHF (congestive heart failure) (HCC)   . Diabetes mellitus without complication (HCC) 07/10/13   referred to DSME, did not show (04/2014)  . GERD (gastroesophageal reflux disease)   . HFrEF (heart failure with reduced ejection fraction) (HCC)    a. 11/2017 Echo: EF 20-25%, diff HK;  b. 02/2018 Echo: EF 30-35%, diff HK  . Hypertension   . Marijuana abuse   . Morbidly obese (HCC) 03/24/2014  . NICM (nonischemic cardiomyopathy) (HCC)    a. 11/2017 Echo: EF 20-25%, diff HK, mild MR, mildy dil LA, PASP ; b. 11/2017 Cath: nl cors. PCWP . CO/CI 4.8/1.79; c. 02/2018 Echo: EF 30-35%, diff HK, mildly dil  LA, nl RV fxn. PASP .  . OSA (obstructive sleep apnea) 10/29/2013  . Tobacco abuse   . Wolff-Parkinson-White (WPW) syndrome    a. 11/2017 Admitted w/ SVT @ 190 bpm; b. 12/2017 EPS w/RFCA: right mid septal accessory pathway <1cm from AV node. Ablation performed but developed recurrent WPW post-procedure and placed on Sotalol.     Social History   Socioeconomic History  . Marital status: Single    Spouse name: Not on file  . Number of children: 2  . Years of education: Not on file  . Highest education level: Not on file  Occupational History  . Occupation: Advertising account executive: SELF-EMPLOYED    Comment: [employer]  Tobacco Use  . Smoking status: Former Smoker    Packs/day: 0.25    Years: 15.00    Pack years: 3.75    Types: Cigarettes    Quit date: 2019    Years since quitting: 2.5  . Smokeless tobacco: Never Used  Vaping Use  . Vaping Use: Never used  Substance and Sexual Activity  . Alcohol use: Yes    Alcohol/week: 0.0 standard drinks    Comment: Occassional beer, shots of liquor  . Drug use: Yes    Frequency: 2.0 times per week    Types: Marijuana    Comment: 1 time per week; none in 4-6 months.  . Sexual activity: Not on  file  Other Topics Concern  . Not on file  Social History Narrative   Lives with girlfriend Alvin Phillips grew up in [place]. He is currently living in [place]. He has a daughter and son Alvin Phillips, Alvin Phillips). He breeds dogs Medical sales representative).   Occ: Teaching laboratory technician rep   Social Determinants of Health   Financial Resource Strain:   . Difficulty of Paying Living Expenses:   Food Insecurity:   . Worried About Programme researcher, broadcasting/film/video in the Last Year:   . Barista in the Last Year:   Transportation Needs:   . Freight forwarder (Medical):   Marland Kitchen Lack of Transportation (Non-Medical):   Physical Activity:   . Days of Exercise per Week:   . Minutes of Exercise per Session:   Stress:   . Feeling of  Stress :   Social Connections:   . Frequency of Communication with Friends and Family:   . Frequency of Social Gatherings with Friends and Family:   . Attends Religious Services:   . Active Member of Clubs or Organizations:   . Attends Banker Meetings:   Marland Kitchen Marital Status:   Intimate Partner Violence:   . Fear of Current or Ex-Partner:   . Emotionally Abused:   Marland Kitchen Physically Abused:   . Sexually Abused:     Past Surgical History:  Procedure Laterality Date  . FRACTURE SURGERY Left 2002   fractured arm  . HERNIA REPAIR  12-16-13   umbilical  . PVC ABLATION N/A 12/22/2017   Procedure: WPW  ABLATION;  Surgeon: Marinus Maw, MD;  Location: [facility];  Service: Cardiovascular;  Laterality: N/A;  . RIGHT/LEFT HEART CATH AND CORONARY ANGIOGRAPHY N/A 12/18/2017   Procedure: RIGHT/LEFT HEART CATH AND CORONARY ANGIOGRAPHY;  Surgeon: Iran Ouch, MD;  Location: [facility] INVASIVE CV LAB;  Service: Cardiovascular;  Laterality: N/A;    Family History  Problem Relation Age of Onset  . Diabetes Maternal Grandmother   . Heart attack Maternal Grandmother 80  . Diabetes Paternal Grandfather     No Known Allergies  Current Outpatient Medications on File Prior to Visit  Medication Sig Dispense Refill  . clotrimazole (LOTRIMIN) 1 % cream Apply 1 application topically 2 (two) times daily. (Patient not taking: Reported on 10/08/2019) 80 g 1  . furosemide (LASIX) 40 MG tablet Take 1 tablet (40 mg total) by mouth daily. (Patient not taking: Reported on 05/22/2019) 30 tablet 5  . glucose blood (BAYER CONTOUR NEXT TEST) test strip Use as instructed (Patient not taking: Reported on 05/22/2019) 100 each 5  . ibuprofen (ADVIL,MOTRIN) 200 MG tablet Take 200 mg by mouth as needed for moderate pain.  (Patient not taking: Reported on 10/08/2019)    . Lancets (ONETOUCH ULTRASOFT) lancets Use as instructed (Patient not taking: Reported on 05/22/2019) 100 each 5  . metFORMIN (GLUCOPHAGE XR) 750  MG 24 hr tablet Take 1 tablet (750 mg total) by mouth daily with breakfast. (Patient not taking: Reported on 10/08/2019) 90 tablet 1  . pantoprazole (PROTONIX) 20 MG tablet Take 1 tablet (20 mg total) by mouth daily as needed for heartburn or indigestion. (Patient not taking: Reported on 05/22/2019) 30 tablet 3  . sacubitril-valsartan (ENTRESTO) 97-103 MG Take 1 tablet by mouth 2 (two) times daily. (Patient not taking: Reported on 05/22/2019) 60 tablet 11  . sildenafil (VIAGRA) 50 MG tablet Take 1 tablet (50 mg total) by mouth daily as needed for erectile dysfunction. (Patient  not taking: Reported on 05/22/2019) 20 tablet 0  . sotalol (BETAPACE) 160 MG tablet TAKE 1 TABLET BY MOUTH EVERY 12 HOURS (Patient not taking: Reported on 05/22/2019) 60 tablet 1  . traMADol (ULTRAM) 50 MG tablet Take 50 mg by mouth every 6 (six) hours. (Patient not taking: Reported on 10/08/2019)     No current facility-administered medications on file prior to visit.    BP 134/84   Pulse 82   Temp (!) 96 F (35.6 C) (Temporal)   Ht 5\' 10"  (1.778 m)   Wt (!) 359 lb 8 oz (163.1 kg)   SpO2 97%   BMI 51.58 kg/m    Objective:   Physical Exam Cardiovascular:     Rate and Rhythm: Normal rate.  Skin:    General: Skin is warm and dry.     Findings: Erythema present.     Comments: 4X1 cm fluctuant, raised boil to left upper medial thigh. Tender. Mild erythema. Deeper firm area underneath boil measuring 5X1 cm.  Neurological:     Mental Status: He is alert.            Assessment & Plan:

## 2019-10-11 LAB — WOUND CULTURE
MICRO NUMBER:: 10728421
SPECIMEN QUALITY:: ADEQUATE

## 2019-10-23 ENCOUNTER — Other Ambulatory Visit: Payer: Self-pay | Admitting: Internal Medicine

## 2019-10-23 ENCOUNTER — Telehealth: Payer: Self-pay | Admitting: Family Medicine

## 2019-10-23 NOTE — Telephone Encounter (Signed)
Pt overdue for 3 month f/u. Please contact pt for future appointment. Pt requesting refills.

## 2019-10-23 NOTE — Telephone Encounter (Signed)
Patient called in stating that insurance will be asking for a PA before refilling sildenafil (VIAGRA) 50 MG tablet. Please advise.

## 2019-10-23 NOTE — Telephone Encounter (Signed)
Attempted to schedule no ans no vm  

## 2019-10-23 NOTE — Telephone Encounter (Signed)
Noted  

## 2019-10-24 NOTE — Telephone Encounter (Signed)
Received faxed PA form.  Placed form in Dr. Timoteo Expose box.

## 2019-10-25 ENCOUNTER — Other Ambulatory Visit: Payer: Self-pay

## 2019-10-25 NOTE — Telephone Encounter (Signed)
Faxed form.

## 2019-10-25 NOTE — Telephone Encounter (Signed)
PA form filled out and in LIsa's box.  Patient overdue for DM f/u. Reviewing chart, he has missed 4 OVs in the past year, did not return for f/u as asked. Will ask Mandy to schedule f/u and have no show policy discussion with patient. Thank you.

## 2019-10-29 NOTE — Telephone Encounter (Signed)
Received faxed PA denial.  Reason:  The specific information that we need from your doctor is:  Pt has dx of ED confirmed by one of the following:  1) Organic (an identifiable, non-correctable direct  Physical cause to the condition)- ex:  Diabetes, peripheral vascular dz, spinal cord injuries, surgery, prostate CA, etc),  OR 2) Medication-inducuced (antiandrogens, anticonvulsants, antidepressants, antipsychotics, etc), OR 3) Psychogenic with chart note documentation, OR 4) Idiopathic (cause unknown) with documentation of work-up for organic and psychogenic causes, AND   The pt is not currently on nitrates, including intermittent nitrate therapies

## 2019-10-29 NOTE — Telephone Encounter (Signed)
Filled and in Lisa's box 

## 2019-10-29 NOTE — Telephone Encounter (Signed)
Received an additional faxed PA form.  Placed in Dr. Timoteo Expose box.

## 2019-10-30 NOTE — Telephone Encounter (Signed)
Attempted to reach patient by calling his phone/mobile number.  No answer and voicemail not set up to receive messages.  Will have to try again later.

## 2019-10-30 NOTE — Telephone Encounter (Signed)
Faxed form.

## 2019-10-31 NOTE — Telephone Encounter (Signed)
Received faxed PA approval, valid 10/30/2019- 10/29/2020.   Notified [facility] of approval.  Says they will fill for pt.   Attempted to notify pt by phn.  No answer.  Vm box is not set up yet.

## 2019-12-23 NOTE — Progress Notes (Signed)
Letter sent out to be mailed on 12/23/2019.

## 2019-12-23 NOTE — Telephone Encounter (Signed)
Reached out again to try and schedule patient for an office appointment as he is extremely past due for DM follow up and to discuss our no show/missed appt policy as he has had 4 missed appts in the past year.   Patient's VM is full and I am unable to reach him by cell again.  Both the CMA and I have attempted to reach patient multiple times.   I will mail him a letter requesting appointment and stating the policy.  At this point, as patient is not compliant with care, we cannot continue to fill his medication or move forward with plan of care until he makes and keeps appointments.   FYI to Dr. Sharen Hones that I am sending patient a warning letter with request to make an urgent appointment for care f/u needs and that, if not compliant with this request, we will unfortunately have to dismiss him from all [facility] practices as this becomes a patient health safety concern.

## 2019-12-25 ENCOUNTER — Ambulatory Visit: Payer: Self-pay | Admitting: Internal Medicine

## 2020-01-14 ENCOUNTER — Other Ambulatory Visit: Payer: Self-pay

## 2020-01-14 ENCOUNTER — Encounter: Payer: Self-pay | Admitting: Internal Medicine

## 2020-01-14 ENCOUNTER — Ambulatory Visit (INDEPENDENT_AMBULATORY_CARE_PROVIDER_SITE_OTHER): Payer: 59 | Admitting: Internal Medicine

## 2020-01-14 VITALS — BP 140/94 | HR 87 | Temp 97.9°F | Wt 350.0 lb

## 2020-01-14 DIAGNOSIS — S90129A Contusion of unspecified lesser toe(s) without damage to nail, initial encounter: Secondary | ICD-10-CM

## 2020-01-14 NOTE — Patient Instructions (Signed)
  I recommend you elevate your legs for as much as possible over the next few days. Recommend ice for 10 minutes 3 x day. Bruising will improve over time.    RICE Therapy for Routine Care of Injuries Many injuries can be cared for with rest, ice, compression, and elevation (RICE therapy). This includes:  Resting the injured part.  Putting ice on the injury.  Putting pressure (compression) on the injury.  Raising the injured part (elevation). Using RICE therapy can help to lessen pain and swelling. Supplies needed:  Ice.  Plastic bag.  Towel.  Elastic bandage.  Pillow or pillows to raise (elevate) your injured body part. How to care for your injury with RICE therapy Rest Limit your normal activities, and try not to use the injured part of your body. You can go back to your normal activities when your doctor says it is okay to do them and you feel okay. Ask your doctor if you should do exercises to help your injury get better. Ice Put ice on the injured area. Do not put ice on your bare skin.  Put ice in a plastic bag.  Place a towel between your skin and the bag.  Leave the ice on for 20 minutes, 2-3 times a day. Use ice on as many days as told by your doctor.  Compression Compression means putting pressure on the injured area. This can be done with an elastic bandage. If an elastic bandage has been put on your injury:  Do not wrap the bandage too tight. Wrap the bandage more loosely if part of your body away from the bandage is blue, swollen, cold, painful, or loses feeling (gets numb).  Take off the bandage and put it on again. Do this every 3-4 hours or as told by your doctor.  See your doctor if the bandage seems to make your problems worse.  Elevation Elevation means keeping the injured area raised. If you can, raise the injured area above your heart or the center of your chest. Contact a doctor if:  You keep having pain and swelling.  Your symptoms get  worse. Get help right away if:  You have sudden bad pain at your injury or lower than your injury.  You have redness or more swelling around your injury.  You have tingling or numbness at your injury or lower than your injury, and it does not go away when you take off the bandage. Summary  Many injuries can be cared for using rest, ice, compression, and elevation (RICE therapy).  You can go back to your normal activities when you feel okay and your doctor says it is okay.  Put ice on the injured area as told by your doctor.  Get help if your symptoms get worse or if you keep having pain and swelling. This information is not intended to replace advice given to you by your health care provider. Make sure you discuss any questions you have with your health care provider. Document Revised: 11/25/2016 Document Reviewed: 11/25/2016 Elsevier Patient Education  2020 [employer].

## 2020-01-14 NOTE — Progress Notes (Signed)
Subjective:    Patient ID: Alvin Phillips, male    DOB: 31-Oct-1980, 39 y.o.   MRN: [id number]  HPI  Pt presents to the clinic today with c/o a bruise on the end of his great toe, right foot. He noticed this 2 days ago after kicking a piece of wood. He reports the area is sore, but not red, warm and he has not noticed any drainage. He does have a history of DM 2, last A1C 12.1%, 05/2019.  Review of Systems      Past Medical History:  Diagnosis Date  . CHF (congestive heart failure) (HCC)   . Diabetes mellitus without complication (HCC) 07/10/13   referred to DSME, did not show (04/2014)  . GERD (gastroesophageal reflux disease)   . HFrEF (heart failure with reduced ejection fraction) (HCC)    a. 11/2017 Echo: EF 20-25%, diff HK;  b. 02/2018 Echo: EF 30-35%, diff HK  . Hypertension   . Marijuana abuse   . Morbidly obese (HCC) 03/24/2014  . NICM (nonischemic cardiomyopathy) (HCC)    a. 11/2017 Echo: EF 20-25%, diff HK, mild MR, mildy dil LA, PASP ; b. 11/2017 Cath: nl cors. PCWP . CO/CI 4.8/1.79; c. 02/2018 Echo: EF 30-35%, diff HK, mildly dil LA, nl RV fxn. PASP .  . OSA (obstructive sleep apnea) 10/29/2013  . Tobacco abuse   . Wolff-Parkinson-White (WPW) syndrome    a. 11/2017 Admitted w/ SVT @ 190 bpm; b. 12/2017 EPS w/RFCA: right mid septal accessory pathway <1cm from AV node. Ablation performed but developed recurrent WPW post-procedure and placed on Sotalol.    Current Outpatient Medications  Medication Sig Dispense Refill  . clotrimazole (LOTRIMIN) 1 % cream Apply 1 application topically 2 (two) times daily. (Patient not taking: Reported on 10/08/2019) 80 g 1  . furosemide (LASIX) 40 MG tablet Take 1 tablet (40 mg total) by mouth daily. (Patient not taking: Reported on 05/22/2019) 30 tablet 5  . glucose blood (BAYER CONTOUR NEXT TEST) test strip Use as instructed (Patient not taking: Reported on 05/22/2019) 100 each 5  . ibuprofen (ADVIL,MOTRIN) 200 MG tablet Take 200 mg by  mouth as needed for moderate pain.  (Patient not taking: Reported on 10/08/2019)    . Lancets (ONETOUCH ULTRASOFT) lancets Use as instructed (Patient not taking: Reported on 05/22/2019) 100 each 5  . metFORMIN (GLUCOPHAGE XR) 750 MG 24 hr tablet Take 1 tablet (750 mg total) by mouth daily with breakfast. (Patient not taking: Reported on 10/08/2019) 90 tablet 1  . pantoprazole (PROTONIX) 20 MG tablet Take 1 tablet (20 mg total) by mouth daily as needed for heartburn or indigestion. (Patient not taking: Reported on 05/22/2019) 30 tablet 3  . sacubitril-valsartan (ENTRESTO) 97-103 MG Take 1 tablet by mouth 2 (two) times daily. (Patient not taking: Reported on 05/22/2019) 60 tablet 11  . sildenafil (VIAGRA) 50 MG tablet Take 1 tablet (50 mg total) by mouth daily as needed for erectile dysfunction. (Patient not taking: Reported on 05/22/2019) 20 tablet 0  . sotalol (BETAPACE) 160 MG tablet Take 1 tablet (160 mg total) by mouth every 12 (twelve) hours. NO FURTHER REFILLS UNTIL SEEN IN CLINIC. PLEASE CALL OFFICE TO SCHEDULE 30 tablet 0  . sulfamethoxazole-trimethoprim (BACTRIM DS) 800-160 MG tablet Take 1 tablet by mouth 2 (two) times daily. 20 tablet 0  . traMADol (ULTRAM) 50 MG tablet Take 50 mg by mouth every 6 (six) hours. (Patient not taking: Reported on 10/08/2019)     No current facility-administered medications  for this visit.    No Known Allergies  Family History  Problem Relation Age of Onset  . Diabetes Maternal Grandmother   . Heart attack Maternal Grandmother 80  . Diabetes Paternal Grandfather     Social History   Socioeconomic History  . Marital status: Single    Spouse name: Not on file  . Number of children: 2  . Years of education: Not on file  . Highest education level: Not on file  Occupational History  . Occupation: Advertising account executive: SELF-EMPLOYED    Comment: [employer]  Tobacco Use  . Smoking status: Former Smoker    Packs/day: 0.25    Years: 15.00     Pack years: 3.75    Types: Cigarettes    Quit date: 2019    Years since quitting: 2.8  . Smokeless tobacco: Never Used  Vaping Use  . Vaping Use: Never used  Substance and Sexual Activity  . Alcohol use: Yes    Alcohol/week: 0.0 standard drinks    Comment: Occassional beer, shots of liquor  . Drug use: Yes    Frequency: 2.0 times per week    Types: Marijuana    Comment: 1 time per week; none in 4-6 months.  . Sexual activity: Not on file  Other Topics Concern  . Not on file  Social History Narrative   Lives with girlfriend Alvin Phillips grew up in [place]. He is currently living in [place]. He has a daughter and son Alvin Phillips, Alvin Phillips). He breeds dogs Medical sales representative).   Occ: Teaching laboratory technician rep   Social Determinants of Health   Financial Resource Strain:   . Difficulty of Paying Living Expenses: Not on file  Food Insecurity:   . Worried About Programme researcher, broadcasting/film/video in the Last Year: Not on file  . Ran Out of Food in the Last Year: Not on file  Transportation Needs:   . Lack of Transportation (Medical): Not on file  . Lack of Transportation (Non-Medical): Not on file  Physical Activity:   . Days of Exercise per Week: Not on file  . Minutes of Exercise per Session: Not on file  Stress:   . Feeling of Stress : Not on file  Social Connections:   . Frequency of Communication with Friends and Family: Not on file  . Frequency of Social Gatherings with Friends and Family: Not on file  . Attends Religious Services: Not on file  . Active Member of Clubs or Organizations: Not on file  . Attends Banker Meetings: Not on file  . Marital Status: Not on file  Intimate Partner Violence:   . Fear of Current or Ex-Partner: Not on file  . Emotionally Abused: Not on file  . Physically Abused: Not on file  . Sexually Abused: Not on file     Constitutional: Denies fever, malaise, fatigue, headache or abrupt weight changes.  Musculoskeletal: Denies decrease  in range of motion, difficulty with gait, muscle pain or joint pain and swelling.  Skin: Pt reports bruise of right great toe. Denies redness, rashes, lesions or ulcercations.   No other specific complaints in a complete review of systems (except as listed in HPI above).  Objective:   Physical Exam  BP (!) 140/94   Pulse 87   Temp 97.9 F (36.6 C) (Temporal)   Wt (!) 350 lb (158.8 kg)   SpO2 97%   BMI 50.22 kg/m   Wt Readings from Last 3  Encounters:  10/08/19 (!) 359 lb 8 oz (163.1 kg)  05/22/19 (!) 368 lb (166.9 kg)  03/27/19 (!) 372 lb 3.2 oz (168.8 kg)    General: Appears his stated age, obese in NAD. Skin: Fugal toenail of right great toe. Bruise noted to tip of right great toe. Cardiovascular: Pedal pulse 2+ bilaterally. Pulmonary/Chest: Normal effort and positive vesicular breath sounds. No respiratory distress. No wheezes, rales or ronchi noted.  Musculoskeletal: Normal flexion and extension of the right great toe. No signs of joint swelling. No difficulty with gait.  Neurological: Alert and oriented. Sensation intact to RLE.     BMET    Component Value Date/Time   NA 136 12/05/2018 1517   NA 142 01/01/2018 1441   NA 140 12/10/2013 1504   K 3.8 12/05/2018 1517   K 3.6 12/10/2013 1504   CL 97 (L) 12/05/2018 1517   CL 105 12/10/2013 1504   CO2 28 12/05/2018 1517   CO2 31 12/10/2013 1504   GLUCOSE 279 (H) 12/05/2018 1517   GLUCOSE 60 (L) 12/10/2013 1504   BUN 15 12/05/2018 1517   BUN 16 01/01/2018 1441   BUN 11 12/10/2013 1504   CREATININE 0.71 12/05/2018 1517   CREATININE 0.97 12/10/2013 1504   CALCIUM 8.7 (L) 12/05/2018 1517   CALCIUM 8.6 12/10/2013 1504   GFRNONAA >60 12/05/2018 1517   GFRNONAA >60 12/10/2013 1504   GFRAA >60 12/05/2018 1517   GFRAA >60 12/10/2013 1504    Lipid Panel     Component Value Date/Time   CHOL 163 12/17/2017 0535   TRIG 215 (H) 12/17/2017 0535   HDL 27 (L) 12/17/2017 0535   CHOLHDL 6.0 12/17/2017 0535   VLDL 43 (H)  12/17/2017 0535   LDLCALC 93 12/17/2017 0535    CBC    Component Value Date/Time   WBC 6.4 12/05/2018 1517   RBC 4.34 12/05/2018 1517   HGB 12.7 (L) 12/05/2018 1517   HGB 12.9 (L) 07/09/2013 1912   HCT 39.1 12/05/2018 1517   HCT 39.7 (L) 07/09/2013 1912   PLT 315 12/05/2018 1517   PLT 295 07/09/2013 1912   MCV 90.1 12/05/2018 1517   MCV 89 07/09/2013 1912   MCH 29.3 12/05/2018 1517   MCHC 32.5 12/05/2018 1517   RDW 12.7 12/05/2018 1517   RDW 13.8 07/09/2013 1912   LYMPHSABS 2.5 12/05/2018 1517   MONOABS 0.6 12/05/2018 1517   EOSABS 0.1 12/05/2018 1517   BASOSABS 0.0 12/05/2018 1517    Hgb A1C Lab Results  Component Value Date   HGBA1C 12.1 (A) 05/22/2019           Assessment & Plan:   Toe Bruise:  Advised him this would resolve with time Recommend rest, ice and elevation Offered xray, but he declines at this time  Return precautions discussed Nicki Reaper, NP This visit occurred during the SARS-CoV-2 public health emergency.  Safety protocols were in place, including screening questions prior to the visit, additional usage of staff PPE, and extensive cleaning of exam room while observing appropriate contact time as indicated for disinfecting solutions.

## 2020-01-24 ENCOUNTER — Telehealth: Payer: Self-pay | Admitting: Family Medicine

## 2020-01-24 ENCOUNTER — Ambulatory Visit: Payer: Self-pay | Admitting: Family Medicine

## 2020-01-24 NOTE — Telephone Encounter (Signed)
Patient again did not keep today's appointment. This patient has a pattern of missing office visits. Will forward to our office manager.

## 2020-02-05 ENCOUNTER — Encounter: Payer: Self-pay | Admitting: Family Medicine

## 2020-02-05 ENCOUNTER — Other Ambulatory Visit: Payer: Self-pay

## 2020-02-05 ENCOUNTER — Ambulatory Visit (INDEPENDENT_AMBULATORY_CARE_PROVIDER_SITE_OTHER): Payer: 59 | Admitting: Family Medicine

## 2020-02-05 DIAGNOSIS — Z23 Encounter for immunization: Secondary | ICD-10-CM | POA: Diagnosis not present

## 2020-02-05 DIAGNOSIS — T148XXA Other injury of unspecified body region, initial encounter: Secondary | ICD-10-CM | POA: Insufficient documentation

## 2020-02-05 DIAGNOSIS — S91331A Puncture wound without foreign body, right foot, initial encounter: Secondary | ICD-10-CM

## 2020-02-05 MED ORDER — CIPROFLOXACIN HCL 500 MG PO TABS: 500.0000 mg | ORAL_TABLET | Freq: Two times a day (BID) | ORAL | 0 refills | Status: DC

## 2020-02-05 NOTE — Progress Notes (Signed)
Subjective:    Patient ID: Alvin Phillips, male    DOB: 15-Feb-1981, 39 y.o.   MRN: [id number]  This visit occurred during the SARS-CoV-2 public health emergency.  Safety protocols were in place, including screening questions prior to the visit, additional usage of staff PPE, and extensive cleaning of exam room while observing appropriate contact time as indicated for disinfecting solutions.    HPI 39 yo pt of Dr Reece Agar presents with puncture wound of R foot  Wt Readings from Last 3 Encounters:  02/05/20 (!) 349 lb 1 oz (158.3 kg)  01/14/20 (!) 350 lb (158.8 kg)  10/08/19 (!) 359 lb 8 oz (163.1 kg)   50.09 kg/m  Stepped on a nail on his deck  Was chasing a dog when it happened  2 d ago  In the evening   Does not think nail was rusty or crumbling  Did not bleed a lot  Soaked with warm epsom salt  Also soap and water  Used peroxide once  Is uncomfortable   Last night a little swollen-not as bad today  Tends towards ingrown nails also  ? If neuropathy  Has h/o plantar fascitis   He was wearing crocks   ? Last tetanus shot   Patient Active Problem List   Diagnosis Date Noted  . Puncture wound 02/05/2020  . Boil of lower extremity 10/08/2019  . Carbuncle of scrotum 03/27/2019  . Balanitis 03/27/2019  . Hidradenitis suppurativa 03/27/2019  . Chronic systolic heart failure (HCC) 02/09/2019  . Erectile dysfunction 01/30/2019  . Genital warts 01/30/2019  . Nonischemic cardiomyopathy (HCC) 12/28/2017  . HFrEF (heart failure with reduced ejection fraction) (HCC) 12/21/2017  . Allergic dermatitis 10/26/2017  . WPW (Wolff-Parkinson-White syndrome) 05/13/2016  . Morbid obesity with BMI of 50.0-59.9, adult (HCC) 03/24/2014  . GERD (gastroesophageal reflux disease) 03/24/2014  . Essential hypertension 03/24/2014  . OSA (obstructive sleep apnea) 10/29/2013  . Plantar fasciitis, bilateral 10/29/2013  . Diabetes mellitus type 2, uncontrolled, with complications (HCC) 08/27/2013  .  Microalbuminuria 08/27/2013  . Microscopic hematuria 07/22/2013   Past Medical History:  Diagnosis Date  . CHF (congestive heart failure) (HCC)   . Diabetes mellitus without complication (HCC) 07/10/13   referred to DSME, did not show (04/2014)  . GERD (gastroesophageal reflux disease)   . HFrEF (heart failure with reduced ejection fraction) (HCC)    a. 11/2017 Echo: EF 20-25%, diff HK;  b. 02/2018 Echo: EF 30-35%, diff HK  . Hypertension   . Marijuana abuse   . Morbidly obese (HCC) 03/24/2014  . NICM (nonischemic cardiomyopathy) (HCC)    a. 11/2017 Echo: EF 20-25%, diff HK, mild MR, mildy dil LA, PASP ; b. 11/2017 Cath: nl cors. PCWP . CO/CI 4.8/1.79; c. 02/2018 Echo: EF 30-35%, diff HK, mildly dil LA, nl RV fxn. PASP .  . OSA (obstructive sleep apnea) 10/29/2013  . Tobacco abuse   . Wolff-Parkinson-White (WPW) syndrome    a. 11/2017 Admitted w/ SVT @ 190 bpm; b. 12/2017 EPS w/RFCA: right mid septal accessory pathway <1cm from AV node. Ablation performed but developed recurrent WPW post-procedure and placed on Sotalol.   Past Surgical History:  Procedure Laterality Date  . FRACTURE SURGERY Left 2002   fractured arm  . HERNIA REPAIR  12-16-13   umbilical  . PVC ABLATION N/A 12/22/2017   Procedure: WPW  ABLATION;  Surgeon: Marinus Maw, MD;  Location: [facility];  Service: Cardiovascular;  Laterality: N/A;  . RIGHT/LEFT HEART CATH  AND CORONARY ANGIOGRAPHY N/A 12/18/2017   Procedure: RIGHT/LEFT HEART CATH AND CORONARY ANGIOGRAPHY;  Surgeon: Iran Ouch, MD;  Location: [facility] INVASIVE [facility];  Service: Cardiovascular;  Laterality: N/A;   Social History   Tobacco Use  . Smoking status: Former Smoker    Packs/day: 0.25    Years: 15.00    Pack years: 3.75    Types: Cigarettes    Quit date: 2019    Years since quitting: 2.8  . Smokeless tobacco: Never Used  Vaping Use  . Vaping Use: Never used  Substance Use Topics  . Alcohol use: Yes    Alcohol/week: 0.0  standard drinks    Comment: Occassional beer, shots of liquor  . Drug use: Yes    Frequency: 2.0 times per week    Types: Marijuana    Comment: 1 time per week; none in 4-6 months.   Family History  Problem Relation Age of Onset  . Diabetes Maternal Grandmother   . Heart attack Maternal Grandmother 80  . Diabetes Paternal Grandfather    Allergies  Allergen Reactions  . Sulfa Antibiotics     Itching, rash   Current Outpatient Medications on File Prior to Visit  Medication Sig Dispense Refill  . clotrimazole (LOTRIMIN) 1 % cream Apply 1 application topically 2 (two) times daily. 80 g 1  . furosemide (LASIX) 40 MG tablet Take 1 tablet (40 mg total) by mouth daily. 30 tablet 5  . glucose blood (BAYER CONTOUR NEXT TEST) test strip Use as instructed 100 each 5  . ibuprofen (ADVIL,MOTRIN) 200 MG tablet Take 200 mg by mouth as needed for moderate pain.     . Lancets (ONETOUCH ULTRASOFT) lancets Use as instructed 100 each 5  . metFORMIN (GLUCOPHAGE XR) 750 MG 24 hr tablet Take 1 tablet (750 mg total) by mouth daily with breakfast. 90 tablet 1  . pantoprazole (PROTONIX) 20 MG tablet Take 1 tablet (20 mg total) by mouth daily as needed for heartburn or indigestion. 30 tablet 3  . sacubitril-valsartan (ENTRESTO) 97-103 MG Take 1 tablet by mouth 2 (two) times daily. 60 tablet 11  . sildenafil (VIAGRA) 50 MG tablet Take 1 tablet (50 mg total) by mouth daily as needed for erectile dysfunction. 20 tablet 0  . sotalol (BETAPACE) 160 MG tablet Take 1 tablet (160 mg total) by mouth every 12 (twelve) hours. NO FURTHER REFILLS UNTIL SEEN IN CLINIC. PLEASE CALL OFFICE TO SCHEDULE 30 tablet 0  . traMADol (ULTRAM) 50 MG tablet Take 50 mg by mouth every 6 (six) hours.      No current facility-administered medications on file prior to visit.     Review of Systems  Constitutional: Negative for activity change, appetite change, fatigue, fever and unexpected weight change.  HENT: Negative for congestion,  rhinorrhea, sore throat and trouble swallowing.   Eyes: Negative for pain, redness, itching and visual disturbance.  Respiratory: Negative for cough, chest tightness, shortness of breath and wheezing.   Cardiovascular: Negative for chest pain and palpitations.  Gastrointestinal: Negative for abdominal pain, blood in stool, constipation, diarrhea and nausea.  Endocrine: Negative for cold intolerance, heat intolerance, polydipsia and polyuria.  Genitourinary: Negative for difficulty urinating, dysuria, frequency and urgency.  Musculoskeletal: Negative for arthralgias, joint swelling and myalgias.  Skin: Negative for pallor and rash.       Pain in foot at puncture site   Neurological: Negative for dizziness, tremors, weakness, numbness and headaches.  Hematological: Negative for adenopathy. Does not bruise/bleed easily.  Psychiatric/Behavioral:  Negative for decreased concentration and dysphoric mood. The patient is not nervous/anxious.        Objective:   Physical Exam Constitutional:      General: He is not in acute distress.    Appearance: Normal appearance. He is obese. He is not ill-appearing.  Eyes:     Conjunctiva/sclera: Conjunctivae normal.     Pupils: Pupils are equal, round, and reactive to light.  Cardiovascular:     Pulses: Normal pulses.  Pulmonary:     Effort: Pulmonary effort is normal. No respiratory distress.  Musculoskeletal:     Right lower leg: No edema.     Left lower leg: No edema.  Skin:    Coloration: Skin is not pale.     Findings: No erythema or rash.     Comments: Toe nails are thickened  R great nail ingrown laterally /mild  2 mm puncture wound seen under 3rd metatarsal head on plantar surface of foot  Clean, not bleeding or draining  Very slt tenderness , scant swelling and no erythema   Perfusion/sensatoin intact  Neurological:     Mental Status: He is alert.     Sensory: No sensory deficit.  Psychiatric:        Mood and Affect: Mood normal.            Assessment & Plan:   Problem List Items Addressed This Visit      Other   Puncture wound    Plantar surface of R foot in a diabetic pt Mild pain , reassuring exam  tx with cipro (to emp cover for pseudomonas since nail went through shoe)  Disc poss side eff Keep clean with soap and water  inst to call if inc in pain/swelling or if redness or drainage develop  Td given  Needs to f/u with pcp for DM      Relevant Orders   Td : Tetanus/diphtheria >7yo Preservative  free (Completed)

## 2020-02-05 NOTE — Assessment & Plan Note (Signed)
Plantar surface of R foot in a diabetic pt Mild pain , reassuring exam  tx with cipro (to emp cover for pseudomonas since nail went through shoe)  Disc poss side eff Keep clean with soap and water  inst to call if inc in pain/swelling or if redness or drainage develop  Td given  Needs to f/u with pcp for DM

## 2020-02-05 NOTE — Patient Instructions (Addendum)
Tetanus shot today Keep foot clean with soap and water  Take cipro as directed and alert [place] if any side effects   Watch for more swelling or any redness or drainage

## 2020-02-10 ENCOUNTER — Ambulatory Visit (INDEPENDENT_AMBULATORY_CARE_PROVIDER_SITE_OTHER): Payer: 59 | Admitting: Family Medicine

## 2020-02-10 ENCOUNTER — Encounter: Payer: Self-pay | Admitting: Family Medicine

## 2020-02-10 ENCOUNTER — Other Ambulatory Visit: Payer: Self-pay

## 2020-02-10 ENCOUNTER — Other Ambulatory Visit: Payer: Self-pay | Admitting: Family Medicine

## 2020-02-10 VITALS — BP 154/100 | HR 81 | Temp 97.9°F | Ht 70.0 in | Wt 345.0 lb

## 2020-02-10 DIAGNOSIS — R202 Paresthesia of skin: Secondary | ICD-10-CM | POA: Diagnosis not present

## 2020-02-10 DIAGNOSIS — R809 Proteinuria, unspecified: Secondary | ICD-10-CM

## 2020-02-10 DIAGNOSIS — R252 Cramp and spasm: Secondary | ICD-10-CM

## 2020-02-10 DIAGNOSIS — E118 Type 2 diabetes mellitus with unspecified complications: Secondary | ICD-10-CM

## 2020-02-10 DIAGNOSIS — E1165 Type 2 diabetes mellitus with hyperglycemia: Secondary | ICD-10-CM | POA: Diagnosis not present

## 2020-02-10 DIAGNOSIS — N529 Male erectile dysfunction, unspecified: Secondary | ICD-10-CM

## 2020-02-10 DIAGNOSIS — I10 Essential (primary) hypertension: Secondary | ICD-10-CM

## 2020-02-10 DIAGNOSIS — IMO0002 Reserved for concepts with insufficient information to code with codable children: Secondary | ICD-10-CM

## 2020-02-10 DIAGNOSIS — Z9119 Patient's noncompliance with other medical treatment and regimen: Secondary | ICD-10-CM

## 2020-02-10 DIAGNOSIS — I5020 Unspecified systolic (congestive) heart failure: Secondary | ICD-10-CM

## 2020-02-10 DIAGNOSIS — Z91199 Patient's noncompliance with other medical treatment and regimen due to unspecified reason: Secondary | ICD-10-CM | POA: Insufficient documentation

## 2020-02-10 DIAGNOSIS — I456 Pre-excitation syndrome: Secondary | ICD-10-CM

## 2020-02-10 LAB — BASIC METABOLIC PANEL
BUN: 12 mg/dL (ref 6–23)
CO2: 31 mEq/L (ref 19–32)
Calcium: 9.4 mg/dL (ref 8.4–10.5)
Chloride: 96 mEq/L (ref 96–112)
Creatinine, Ser: 0.75 mg/dL (ref 0.40–1.50)
GFR: 112.66 mL/min (ref >60.00)
Glucose, Bld: 301 mg/dL — ABNORMAL HIGH (ref 70–99)
Potassium: 4.2 mEq/L (ref 3.5–5.1)
Sodium: 136 mEq/L (ref 135–145)

## 2020-02-10 LAB — VITAMIN B12: Vitamin B-12: 230 pg/mL (ref 211–911)

## 2020-02-10 LAB — LDL CHOLESTEROL, DIRECT: Direct LDL: 109 mg/dL

## 2020-02-10 LAB — POCT GLYCOSYLATED HEMOGLOBIN (HGB A1C): Hemoglobin A1C: 14 % — AB (ref 4.0–5.6)

## 2020-02-10 MED ORDER — OZEMPIC (0.25 OR 0.5 MG/DOSE) 2 MG/1.5ML ~~LOC~~ SOPN: PEN_INJECTOR | SUBCUTANEOUS | 11 refills | Status: AC

## 2020-02-10 NOTE — Assessment & Plan Note (Signed)
Off all diabetic meds. States metformin was not tolerated - will stop. Reviewed reasons to treat diabetes. Agrees to retrial ozempic which was previously tolerated and affordable. He knows how to self inject. RTC 3 mo DM f/u visit.  Foot exam today - discussed podiatry eval option.  Declines pneumovax.

## 2020-02-10 NOTE — Patient Instructions (Addendum)
Stop metformin, restart ozempic.  Call cardiology for follow up. Keep your appointment.  Schedule eye exam.  Labs today.  I do recommend COVID vaccine.  Return in 3 months for physical or diabetes follow up visit.

## 2020-02-10 NOTE — Assessment & Plan Note (Signed)
BP elevated - off all antihypertensives. rec restart entresto and lasix.

## 2020-02-10 NOTE — Assessment & Plan Note (Signed)
Encouraged call cards for f/u as overdue.

## 2020-02-10 NOTE — Assessment & Plan Note (Signed)
Update microalb.  

## 2020-02-10 NOTE — Assessment & Plan Note (Addendum)
Encouraged restart entresto and lasix call cards for f/u as overdue.

## 2020-02-10 NOTE — Progress Notes (Signed)
Patient ID: Alvin Phillips, male    DOB: 10-01-1980, 39 y.o.   MRN: [id number]  This visit was conducted in person.  BP (!) 154/100 (BP Location: Left Arm, Patient Position: Sitting, Cuff Size: Large)   Pulse 81   Temp 97.9 F (36.6 C) (Temporal)   Ht 5\' 10"  (1.778 m)   Wt (!) 345 lb (156.5 kg)   SpO2 97%   BMI 49.50 kg/m    CC: DM f/u Subjective:   HPI: Alvin Phillips is a 39 y.o. male presenting on 02/10/2020 for Diabetes (Here for f/u.  Also, c/o fingers twitching. )   Declines flu shot, declines pneumovax. COVID vaccine - has not received.  Drank coffee with creamer.   Last seen 05/2019, did not return for DM f/u. H/o no shows, latest 01/24/2020. Discussed this as well as our no show dismissal policy - 5 no shows over the past 15 months. Has also missed cardiology appointments, latest last month.   Not taking any medications at this time. Takes entresto and sotalol "from time to time". Stopped metformin - caused GI upset, nausea.   Notes increasing cramping and tingling of both hands, R>L. Mustard helped some. No foot paresthesias.   Foot puncture wound last week. Had Td, hasn't finished cipro course yet.   DM - does not regularly check sugars. Not taking metformin XR. Previously tried ozempic - not sure why he stopped it. Denies low sugars or hypoglycemic symptoms. Denies foot paresthesias, + hand paresthesias. Last diabetic eye exam DUE. Pneumovax: DUE. Prevnar: not due. Glucometer brand: thinks one-touch. DSME: states unaffordable. Lab Results  Component Value Date   HGBA1C 14.0 (A) 02/10/2020   Diabetic Foot Exam - Simple   Simple Foot Form Visual Inspection See comments: Yes Sensation Testing Intact to touch and monofilament testing bilaterally: Yes Pulse Check Posterior Tibialis and Dorsalis pulse intact bilaterally: Yes Comments Thickened onychomycotic nails     Lab Results  Component Value Date   MICROALBUR 29.6 (H) 02/24/2016        Relevant past  medical, surgical, family and social history reviewed and updated as indicated. Interim medical history since our last visit reviewed. Allergies and medications reviewed and updated. Outpatient Medications Prior to Visit  Medication Sig Dispense Refill  . ciprofloxacin (CIPRO) 500 MG tablet Take 1 tablet (500 mg total) by mouth 2 (two) times daily. 14 tablet 0  . clotrimazole (LOTRIMIN) 1 % cream Apply 1 application topically 2 (two) times daily. 80 g 1  . furosemide (LASIX) 40 MG tablet Take 1 tablet (40 mg total) by mouth daily. 30 tablet 5  . glucose blood (BAYER CONTOUR NEXT TEST) test strip Use as instructed 100 each 5  . ibuprofen (ADVIL,MOTRIN) 200 MG tablet Take 200 mg by mouth as needed for moderate pain.     . Lancets (ONETOUCH ULTRASOFT) lancets Use as instructed 100 each 5  . pantoprazole (PROTONIX) 20 MG tablet Take 1 tablet (20 mg total) by mouth daily as needed for heartburn or indigestion. 30 tablet 3  . sacubitril-valsartan (ENTRESTO) 97-103 MG Take 1 tablet by mouth 2 (two) times daily. 60 tablet 11  . sildenafil (VIAGRA) 50 MG tablet Take 1 tablet (50 mg total) by mouth daily as needed for erectile dysfunction. 20 tablet 0  . sotalol (BETAPACE) 160 MG tablet Take 1 tablet (160 mg total) by mouth every 12 (twelve) hours. NO FURTHER REFILLS UNTIL SEEN IN CLINIC. PLEASE CALL OFFICE TO SCHEDULE 30 tablet 0  .  metFORMIN (GLUCOPHAGE XR) 750 MG 24 hr tablet Take 1 tablet (750 mg total) by mouth daily with breakfast. 90 tablet 1  . traMADol (ULTRAM) 50 MG tablet Take 50 mg by mouth every 6 (six) hours.      No facility-administered medications prior to visit.     Per HPI unless specifically indicated in ROS section below Review of Systems Objective:  BP (!) 154/100 (BP Location: Left Arm, Patient Position: Sitting, Cuff Size: Large)   Pulse 81   Temp 97.9 F (36.6 C) (Temporal)   Ht 5\' 10"  (1.778 m)   Wt (!) 345 lb (156.5 kg)   SpO2 97%   BMI 49.50 kg/m   Wt Readings from  Last 3 Encounters:  02/10/20 (!) 345 lb (156.5 kg)  02/05/20 (!) 349 lb 1 oz (158.3 kg)  01/14/20 (!) 350 lb (158.8 kg)      Physical Exam Vitals and nursing note reviewed.  Constitutional:      General: He is not in acute distress.    Appearance: Normal appearance. He is well-developed. He is obese.  HENT:     Head: Normocephalic and atraumatic.     Right Ear: External ear normal.     Left Ear: External ear normal.     Nose: Nose normal.     Mouth/Throat:     Pharynx: No oropharyngeal exudate.  Eyes:     General: No scleral icterus.    Conjunctiva/sclera: Conjunctivae normal.     Pupils: Pupils are equal, round, and reactive to light.  Cardiovascular:     Rate and Rhythm: Normal rate and regular rhythm.     Pulses: Normal pulses.     Heart sounds: Normal heart sounds. No murmur heard.   Pulmonary:     Effort: Pulmonary effort is normal. No respiratory distress.     Breath sounds: Normal breath sounds. No wheezing, rhonchi or rales.  Musculoskeletal:     Cervical back: Normal range of motion and neck supple.     Comments: See HPI for foot exam if done  Lymphadenopathy:     Cervical: No cervical adenopathy.  Skin:    General: Skin is warm and dry.     Findings: No rash.  Neurological:     Mental Status: He is alert.  Psychiatric:        Mood and Affect: Mood normal.        Behavior: Behavior normal.       Results for orders placed or performed in visit on 02/10/20  POCT glycosylated hemoglobin (Hb A1C)  Result Value Ref Range   Hemoglobin A1C 14.0 (A) 4.0 - 5.6 %   HbA1c POC (<> result, manual entry)     HbA1c, POC (prediabetic range)     HbA1c, POC (controlled diabetic range)     Assessment & Plan:  This visit occurred during the SARS-CoV-2 public health emergency.  Safety protocols were in place, including screening questions prior to the visit, additional usage of staff PPE, and extensive cleaning of exam room while observing appropriate contact time as  indicated for disinfecting solutions.   I encouraged he complete COVID vaccination series given high risk comorbidities.  Problem List Items Addressed This Visit    WPW (Wolff-Parkinson-White syndrome)    Encouraged call cards for f/u as overdue.      Obesity, morbid, BMI 40.0-49.9 (HCC)   Relevant Medications   Semaglutide,0.25 or 0.5MG /DOS, (OZEMPIC, 0.25 OR 0.5 MG/DOSE,) 2 MG/1.5ML SOPN   Nonadherence to medical treatment  Again discussed no show policy He has mychart - encouraged he use this.       Microalbuminuria    Update microalb.       HFrEF (heart failure with reduced ejection fraction) (HCC)    Encouraged restart entresto and lasix call cards for f/u as overdue.      Essential hypertension    BP elevated - off all antihypertensives. rec restart entresto and lasix.      Diabetes mellitus type 2, uncontrolled, with complications (HCC) - Primary    Off all diabetic meds. States metformin was not tolerated - will stop. Reviewed reasons to treat diabetes. Agrees to retrial ozempic which was previously tolerated and affordable. He knows how to self inject. RTC 3 mo DM f/u visit.  Foot exam today - discussed podiatry eval option.  Declines pneumovax.       Relevant Medications   Semaglutide,0.25 or 0.5MG /DOS, (OZEMPIC, 0.25 OR 0.5 MG/DOSE,) 2 MG/1.5ML SOPN   Other Relevant Orders   POCT glycosylated hemoglobin (Hb A1C) (Completed)   LDL Cholesterol, Direct   Microalbumin / creatinine urine ratio    Other Visit Diagnoses    Cramping of hands       Relevant Orders   Basic metabolic panel   Paresthesia of both hands       Relevant Orders   Vitamin B12       Meds ordered this encounter  Medications  . Semaglutide,0.25 or 0.5MG /DOS, (OZEMPIC, 0.25 OR 0.5 MG/DOSE,) 2 MG/1.5ML SOPN    Sig: Inject 0.25 mg into the skin once a week for 14 days, THEN 0.5 mg once a week.    Dispense:  1.5 mL    Refill:  11   Orders Placed This Encounter  Procedures  . Vitamin  B12  . LDL Cholesterol, Direct  . Basic metabolic panel  . Microalbumin / creatinine urine ratio  . POCT glycosylated hemoglobin (Hb A1C)    Patient Instructions  Stop metformin, restart ozempic.  Call cardiology for follow up. Keep your appointment.  Schedule eye exam.  Labs today.  I do recommend COVID vaccine.  Return in 3 months for physical or diabetes follow up visit.    Follow up plan: Return in about 3 months (around 05/12/2020), or if symptoms worsen or fail to improve, for follow up visit.  Eustaquio Boyden, MD

## 2020-02-10 NOTE — Assessment & Plan Note (Signed)
Again discussed no show policy He has mychart - encouraged he use this.

## 2020-02-11 LAB — MICROALBUMIN / CREATININE URINE RATIO
Creatinine,U: 124.3 mg/dL
Microalb Creat Ratio: 56.8 mg/g — ABNORMAL HIGH (ref 0.0–30.0)
Microalb, Ur: 70.6 mg/dL — ABNORMAL HIGH (ref 0.0–1.9)

## 2020-02-11 NOTE — Telephone Encounter (Signed)
E-scribed refill.  Plz schedule cpe and lab visits.  

## 2020-02-12 ENCOUNTER — Other Ambulatory Visit: Payer: Self-pay | Admitting: Family Medicine

## 2020-02-12 ENCOUNTER — Telehealth: Payer: Self-pay | Admitting: Family Medicine

## 2020-02-12 ENCOUNTER — Encounter: Payer: Self-pay | Admitting: Family Medicine

## 2020-02-12 DIAGNOSIS — E785 Hyperlipidemia, unspecified: Secondary | ICD-10-CM | POA: Insufficient documentation

## 2020-02-12 DIAGNOSIS — E1169 Type 2 diabetes mellitus with other specified complication: Secondary | ICD-10-CM | POA: Insufficient documentation

## 2020-02-12 DIAGNOSIS — E1121 Type 2 diabetes mellitus with diabetic nephropathy: Secondary | ICD-10-CM | POA: Insufficient documentation

## 2020-02-12 DIAGNOSIS — E1129 Type 2 diabetes mellitus with other diabetic kidney complication: Secondary | ICD-10-CM | POA: Insufficient documentation

## 2020-02-12 DIAGNOSIS — IMO0002 Reserved for concepts with insufficient information to code with codable children: Secondary | ICD-10-CM | POA: Insufficient documentation

## 2020-02-12 MED ORDER — B-12 1000 MCG SL SUBL: 1.0000 | SUBLINGUAL_TABLET | Freq: Every day | SUBLINGUAL | Status: DC

## 2020-02-12 NOTE — Telephone Encounter (Signed)
Called Twice. Patient does not have a vm. Will mail letter to patient to call office to set up cpe and labs. EM

## 2020-02-17 ENCOUNTER — Ambulatory Visit (INDEPENDENT_AMBULATORY_CARE_PROVIDER_SITE_OTHER): Payer: 59 | Admitting: Podiatry

## 2020-02-17 ENCOUNTER — Other Ambulatory Visit: Payer: Self-pay

## 2020-02-17 ENCOUNTER — Encounter: Payer: Self-pay | Admitting: Podiatry

## 2020-02-17 DIAGNOSIS — B353 Tinea pedis: Secondary | ICD-10-CM

## 2020-02-17 DIAGNOSIS — E118 Type 2 diabetes mellitus with unspecified complications: Secondary | ICD-10-CM

## 2020-02-17 DIAGNOSIS — B351 Tinea unguium: Secondary | ICD-10-CM | POA: Diagnosis not present

## 2020-02-17 DIAGNOSIS — IMO0002 Reserved for concepts with insufficient information to code with codable children: Secondary | ICD-10-CM

## 2020-02-17 DIAGNOSIS — L600 Ingrowing nail: Secondary | ICD-10-CM

## 2020-02-17 DIAGNOSIS — E1165 Type 2 diabetes mellitus with hyperglycemia: Secondary | ICD-10-CM

## 2020-02-17 DIAGNOSIS — M79674 Pain in right toe(s): Secondary | ICD-10-CM

## 2020-02-17 DIAGNOSIS — M79675 Pain in left toe(s): Secondary | ICD-10-CM

## 2020-02-17 MED ORDER — TERBINAFINE HCL 250 MG PO TABS: 250.0000 mg | ORAL_TABLET | Freq: Every day | ORAL | 0 refills | Status: DC

## 2020-02-17 MED ORDER — KETOCONAZOLE 2 % EX CREA: 1.0000 "application " | TOPICAL_CREAM | Freq: Every day | CUTANEOUS | 2 refills | Status: DC

## 2020-02-17 NOTE — Progress Notes (Signed)
  Subjective:  Patient ID: Alvin Phillips, male    DOB: January 25, 1981,  MRN: [id number]  Chief Complaint  Patient presents with  . Nail Problem    Pateient presents today for ingrown toenail right hallux lateral border off and on, nail fungus and tinea pedis bilat which comes and goes    39 y.o. male presents with the above complaint. History confirmed with patient.  Referred by his PCP for diabetic foot exam.  His A1c is 14%.  He is on Ozempic and is trying to get lowered.  Denies numbness and tingling.  He is thick difficult to cut toenails which have incurvated borders.  Also has dry itchy skin.  Objective:  Physical Exam: warm, good capillary refill, no trophic changes or ulcerative lesions, normal DP and PT pulses, normal monofilament exam and normal sensory exam.  Onychomycosis x10.  Incurvated hallux nail borders.  Tinea pedis noted bilaterally. Assessment:   1. Onychomycosis      Plan:  Patient was evaluated and treated and all questions answered.  Patient educated on diabetes. Discussed proper diabetic foot care and discussed risks and complications of disease. Educated patient in depth on reasons to return to the office immediately should he/she discover anything concerning or new on the feet. All questions answered. Discussed proper shoes as well.   Discussed the etiology and treatment options for the condition in detail with the patient. Educated patient on the topical and oral treatment options for mycotic nails. Recommended debridement of the nails today. Sharp and mechanical debridement performed of all painful and mycotic nails today. Nails debrided in length and thickness using a nail nipper and a mechanical burr to level of comfort. Discussed treatment options including appropriate shoe gear. Follow up as needed for painful nails. -Plan to treat with Lamisil pending hepatic function testing, prescription for testing was given to him today.  Prescription for Lamisil sent to  pharmacy x90 days  Discussed the etiology and treatment options for tinea pedis.  Discussed topical and oral treatment.  Recommended topical treatment with 2% ketoconazole cream.  This was sent to the patient's pharmacy.  Also discussed appropriate foot hygiene, use of antifungal spray such as Tinactin in shoes, as well as cleaning her foot surfaces such as showers and bathroom floors with bleach.    Return in about 4 months (around 06/16/2020).

## 2020-02-18 ENCOUNTER — Encounter: Payer: Self-pay | Admitting: Family Medicine

## 2020-02-18 DIAGNOSIS — IMO0002 Reserved for concepts with insufficient information to code with codable children: Secondary | ICD-10-CM

## 2020-02-18 DIAGNOSIS — E1165 Type 2 diabetes mellitus with hyperglycemia: Secondary | ICD-10-CM

## 2020-02-18 NOTE — Telephone Encounter (Signed)
I don't see anything in the notes stating you wanted to start pt on Lipitor.

## 2020-02-19 NOTE — Telephone Encounter (Signed)
Appears patient did schedule and keep appointment on 11/22.  I was unable to get in touch with him between 11/5 and 11/22.  As he made the appointment, we will monitor pattern carefully moving forward.  Please notify me if he has any further missed appointments and I will notify him of his immediate dismissal.

## 2020-02-20 MED ORDER — ATORVASTATIN CALCIUM 20 MG PO TABS: 20.0000 mg | ORAL_TABLET | Freq: Every day | ORAL | 3 refills | Status: DC

## 2020-02-20 NOTE — Addendum Note (Signed)
Addended by: Eustaquio Boyden on: 02/20/2020 06:30 PM   Modules accepted: Orders

## 2020-02-20 NOTE — Telephone Encounter (Signed)
It's in lab result mychart section.  i've asked him to schedule lab visit for LFTs which I have ordered.

## 2020-02-21 NOTE — Telephone Encounter (Addendum)
Attempted to call pt.  No answer.  Vm not set up.  Need to schedule lab visit for LFTs.

## 2020-02-25 NOTE — Telephone Encounter (Signed)
Attempted to call pt.  No answer.  Vm not set up.  Need to schedule lab visit for LFTs.  Sending MyChart msg.

## 2020-03-10 ENCOUNTER — Other Ambulatory Visit: Payer: Self-pay | Admitting: Family Medicine

## 2020-03-10 ENCOUNTER — Other Ambulatory Visit: Payer: Self-pay | Admitting: Internal Medicine

## 2020-03-10 DIAGNOSIS — N529 Male erectile dysfunction, unspecified: Secondary | ICD-10-CM

## 2020-03-10 NOTE — Telephone Encounter (Signed)
Please schedule overdue F/U appointment. Patient did not show up for last scheduled appointment. Thank you!

## 2020-03-10 NOTE — Telephone Encounter (Signed)
Attempted to schedule no ans no vm  

## 2020-03-12 ENCOUNTER — Telehealth: Payer: Self-pay

## 2020-03-12 ENCOUNTER — Other Ambulatory Visit: Payer: Self-pay | Admitting: Internal Medicine

## 2020-03-12 NOTE — Telephone Encounter (Signed)
Able to reach out to pt, explain cannot refill pt's cardiac meds at this time d/t two no show appts this year and has not been seen since 10/2018. Pt verbalized understanding. Pt agree to schedule an appt, will see Dr. Okey Dupre 03/25/2020 at 9am. Advised to bring in all current medications that he is taking and if need to cancel please call clinic to reschedule. Pt understandable and agrees to plan. MyChart message sent as well stating no refills at this time and sent message with upcoming appt.

## 2020-03-12 NOTE — Telephone Encounter (Signed)
Please review for refill. The patient has not been seen by Dr. Okey Dupre since 10/2018 and is requesting refills.  Please advise if okay to refill.  I have placed a message to the scheduling pool to contact for a follow up appointment.  Thanks!

## 2020-03-23 NOTE — Progress Notes (Signed)
Follow-up Outpatient Visit Date: 03/25/2020  Primary Care Provider: Eustaquio Boyden, MD [address] [place] [address]  Chief Complaint: Follow-up nonischemic cardiomyopathy and WPW  HPI:  [name] is a 40 y.o. male with history of Wolff-Parkinson-White syndrome, chronic HFrEF due to nonischemic cardiomyopathy, palpitations, hypertension, uncontrolled diabetes mellitus, morbid obesity, GERD, and sleep apnea, who presents for follow-up of cardiomyopathy and WPW. He was last seen in our office over a year ago (01/2019), at which time he was feeling quite well. He had put on quite a bit of weight during the COVID-19 pandemic. He has been lost to follow-up since then.  Today, [name] states that he has been doing okay.  He happily reports having lost about 30 pounds over the last year due to dietary changes.  This has helped him feel better at times though he has intermittent nausea happening once or twice a day.  He wonders if this could be due to Ozempic.  He has inconsistently been taking his medications over the last 6 months.  He went off all medications for 1 to 2 months and then restarted them on his own.  He ran out of Entresto and sotalol just over a week ago.  He denies shortness of breath, chest pain, and edema.  He has experienced a couple episodes of "jitters" in his chest.  When this occurs he lies down and tries to go to sleep.  The jitters have always been gone when he wakes back up.  --------------------------------------------------------------------------------------------------  Cardiovascular History & Procedures: Cardiovascular Problems:  Chest pain  Shortness of breath  WPW  Risk Factors:  Hypertension, diabetes mellitus, male gender, and morbid obesity  Cath/PCI:  R/LHC (12/18/2017): Normal coronary arteries. Severely elevated left and right heart filling pressures. Moderately reduced Fick cardiac output/index.  CV Surgery:  None  EP  Procedures and Devices:  EPS/ablation (12/22/2017, Dr. 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  Non-Invasive Evaluation(s):  TTE (02/22/2018): Moderately dilated LV with mild LVH. LVEF 30-35% with global hypokinesis. Mild LAE. Normal RV size and function. Mild to moderate pulmonary hypertension.  TTE (12/15/2017): Severely dilated LV with mild LVH. LVEF 20-25% with global hypokinesis. Mild MR. Mild LAE. Mildly reduced RV function. Mild pulmonary hypertension.   Recent CV Pertinent Labs: Lab Results  Component Value Date   CHOL 163 12/17/2017   HDL 27 (L) 12/17/2017   LDLCALC 93 12/17/2017   LDLDIRECT 109.0 02/10/2020   TRIG 215 (H) 12/17/2017   CHOLHDL 6.0 12/17/2017   INR 0.97 12/17/2017   BNP 234.0 (H) 12/14/2017   K 4.2 02/10/2020   K 3.6 12/10/2013   MG 1.8 12/25/2017   BUN 12 02/10/2020   BUN 16 01/01/2018   BUN 11 12/10/2013   CREATININE 0.75 02/10/2020   CREATININE 0.97 12/10/2013    Past medical and surgical history were reviewed and updated in EPIC.  Current Meds  Medication Sig  . Cyanocobalamin (B-12) 1000 MCG SUBL Place 1 tablet under the tongue daily.  . furosemide (LASIX) 40 MG tablet Take 40 mg by mouth as needed.  Marland Kitchen glucose blood (BAYER CONTOUR NEXT TEST) test strip Use as instructed  . Lancets (ONETOUCH ULTRASOFT) lancets Use as instructed  . Multiple Vitamins-Minerals (EQ MULTIVITAMINS ADULT GUMMY PO) Take 2 Units by mouth daily.  . pantoprazole (PROTONIX) 20 MG tablet Take 1 tablet (20 mg total) by mouth daily as needed for heartburn or indigestion.  Marland Kitchen  Semaglutide,0.25 or 0.5MG /DOS, (OZEMPIC, 0.25 OR 0.5 MG/DOSE,) 2 MG/1.5ML SOPN Inject 0.25 mg into the skin once a week for 14 days, THEN 0.5 mg once a week.  . sildenafil (VIAGRA) 50 MG tablet TAKE 1 TABLET BY MOUTH ONCE DAILY AS NEEDED FOR ERECTILE DYSFUNCTION    Allergies: Metformin and related and Sulfa  antibiotics  Social History   Tobacco Use  . Smoking status: Former Smoker    Packs/day: 0.25    Years: 15.00    Pack years: 3.75    Types: Cigarettes    Quit date: 2019    Years since quitting: 3.0  . Smokeless tobacco: Never Used  Vaping Use  . Vaping Use: Never used  Substance Use Topics  . Alcohol use: Yes    Alcohol/week: 0.0 standard drinks    Comment: Occassional beer, shots of liquor  . Drug use: Yes    Frequency: 2.0 times per week    Types: Marijuana    Comment: 1 time per week; none in 4-6 months.    Family History  Problem Relation Age of Onset  . Diabetes Maternal Grandmother   . Heart attack Maternal Grandmother 80  . Diabetes Paternal Grandfather     Review of Systems: A 12-system review of systems was performed and was negative except as noted in the HPI.  --------------------------------------------------------------------------------------------------  Physical Exam: BP (!) 144/108 (BP Location: Left Arm, Patient Position: Sitting, Cuff Size: Large)   Pulse 69   Ht 5' 10.5" (1.791 m)   Wt (!) 348 lb (157.9 kg)   SpO2 98%   BMI 49.23 kg/m   General: NAD. Neck: No JVD or HJR, though body habitus limits evaluation. Lungs: Clear to auscultation bilateral without wheezes or crackles. Heart: Regular rate and rhythm without murmurs, rubs, or gallops. Abdomen: Obese but soft, nontender. Extremities: No lower extremity edema.  EKG: Normal sinus rhythm with first-degree AV block (PR interval 218 ms).  Borderline LVH.  Lab Results  Component Value Date   WBC 6.4 12/05/2018   HGB 12.7 (L) 12/05/2018   HCT 39.1 12/05/2018   MCV 90.1 12/05/2018   PLT 315 12/05/2018    Lab Results  Component Value Date   NA 136 02/10/2020   K 4.2 02/10/2020   CL 96 02/10/2020   CO2 31 02/10/2020   BUN 12 02/10/2020   CREATININE 0.75 02/10/2020   GLUCOSE 301 (H) 02/10/2020   ALT 57 (H) 12/14/2017    Lab Results  Component Value Date   CHOL 163 12/17/2017    HDL 27 (L) 12/17/2017   LDLCALC 93 12/17/2017   LDLDIRECT 109.0 02/10/2020   TRIG 215 (H) 12/17/2017   CHOLHDL 6.0 12/17/2017    --------------------------------------------------------------------------------------------------  ASSESSMENT AND PLAN: Chronic HFrEF due to nonischemic cardiomyopathy: Mr. Vallance reports feeling relatively well with NYHA class I-II heart failure symptoms.  He appears euvolemic on examination today.  Unfortunately, he has been noncompliant with his medications over the last several months and has been off both sotalol and Entresto for over a week now.  We will restart Entresto at 24-26 mg twice daily.  We will also add low-dose metoprolol (metoprolol succinate 25 mg daily) in an attempt to optimize goal-directed medical therapy, though we will need to monitor his PR interval.  Long-term, it would also be beneficial to consider addition of an SGLT2 inhibitor and spironolactone.  Repeat echo once Mr. Pinder heart failure regimen has been optimized, will need to be considered.  WPW: Sporadic palpitations without associated symptoms noted.  Patient was previously on sotalol under the direction of Dr. Lovena Le following attempted accessory pathway ablation but has been taking this inconsistently.  We will stop sotalol for now out of concern for QT prolongation and proarrhythmic effects with inconsistent use.  We have Mr. Supinski follow-up with Dr. Lovena Le as soon as possible for further evaluation.  Hypertension: Blood pressure suboptimally controlled today.  We will restart Entresto and add low-dose metoprolol, as outlined above.  Morbid obesity: I congratulated Mr. Winkowski on his 30 pound weight loss over the last year and encouraged him to continue working on this.  Follow-up: Return to clinic in 6 weeks.  Nelva Bush, MD 03/25/2020 9:17 AM

## 2020-03-25 ENCOUNTER — Ambulatory Visit (INDEPENDENT_AMBULATORY_CARE_PROVIDER_SITE_OTHER): Payer: 59 | Admitting: Internal Medicine

## 2020-03-25 ENCOUNTER — Other Ambulatory Visit: Payer: Self-pay

## 2020-03-25 ENCOUNTER — Encounter: Payer: Self-pay | Admitting: Internal Medicine

## 2020-03-25 ENCOUNTER — Ambulatory Visit: Payer: 59 | Admitting: Internal Medicine

## 2020-03-25 VITALS — BP 144/108 | HR 69 | Ht 70.5 in | Wt 348.0 lb

## 2020-03-25 VITALS — BP 152/100 | HR 80 | Ht 70.0 in | Wt 347.0 lb

## 2020-03-25 DIAGNOSIS — I10 Essential (primary) hypertension: Secondary | ICD-10-CM

## 2020-03-25 DIAGNOSIS — I456 Pre-excitation syndrome: Secondary | ICD-10-CM

## 2020-03-25 DIAGNOSIS — I5022 Chronic systolic (congestive) heart failure: Secondary | ICD-10-CM

## 2020-03-25 DIAGNOSIS — I428 Other cardiomyopathies: Secondary | ICD-10-CM

## 2020-03-25 MED ORDER — METOPROLOL SUCCINATE ER 25 MG PO TB24: 25.0000 mg | ORAL_TABLET | Freq: Every day | ORAL | 3 refills | Status: DC

## 2020-03-25 MED ORDER — ENTRESTO 24-26 MG PO TABS: 1.0000 | ORAL_TABLET | Freq: Two times a day (BID) | ORAL | 3 refills | Status: DC

## 2020-03-25 NOTE — Patient Instructions (Signed)
Medication Instructions:  Your physician recommends that you continue on your current medications as directed. Please refer to the Current Medication list given to you today.  In One Month Increase Toprol XL to 50 mg Daily   *If you need a refill on your cardiac medications before your next appointment, please call your pharmacy*   Lab Work: NONE   If you have labs (blood work) drawn today and your tests are completely normal, you will receive your results only by: Marland Kitchen MyChart Message (if you have MyChart) OR . A paper copy in the mail If you have any lab test that is abnormal or we need to change your treatment, we will call you to review the results.   Testing/Procedures: NONE     Follow-Up: At [facility], you and your health needs are our priority.  As part of our continuing mission to provide you with exceptional heart care, we have created designated Provider Care Teams.  These Care Teams include your primary Cardiologist (physician) and Advanced Practice Providers (APPs -  Physician Assistants and Nurse Practitioners) who all work together to provide you with the care you need, when you need it.  We recommend signing up for the patient portal called "MyChart".  Sign up information is provided on this After Visit Summary.  MyChart is used to connect with patients for Virtual Visits (Telemedicine).  Patients are able to view lab/test results, encounter notes, upcoming appointments, etc.  Non-urgent messages can be sent to your provider as well.   To learn more about what you can do with MyChart, go to [email or web address].    Your next appointment:   1 year(s)  The format for your next appointment:   In Person  Provider:   Lewayne Bunting, MD   Other Instructions Thank you for choosing [facility]!

## 2020-03-25 NOTE — Addendum Note (Signed)
Addended by: Kendrick Fries on: 03/25/2020 11:56 AM   Modules accepted: Orders

## 2020-03-25 NOTE — Progress Notes (Signed)
HPI [name] returns today for followup. He is a pleasant 40 yo man with WPW syndrome s/p ablation which was carried out withing 1 cm of the AV node. He had return of his AP conduction. In the interim, he notes that he has been non-compliant. He had his meds restarted today. He has occaisional palpitations but no syncope or near syncope. Allergies  Allergen Reactions  . Metformin And Related Nausea Only    Also to XR  . Sulfa Antibiotics     Itching, rash     Current Outpatient Medications  Medication Sig Dispense Refill  . Cyanocobalamin (B-12) 1000 MCG SUBL Place 1 tablet under the tongue daily.    . furosemide (LASIX) 40 MG tablet Take 40 mg by mouth as needed.    . Multiple Vitamins-Minerals (EQ MULTIVITAMINS ADULT GUMMY PO) Take 2 Units by mouth daily.    . pantoprazole (PROTONIX) 20 MG tablet Take 1 tablet (20 mg total) by mouth daily as needed for heartburn or indigestion. 30 tablet 3  . sacubitril-valsartan (ENTRESTO) 24-26 MG Take 1 tablet by mouth 2 (two) times daily. 60 tablet 3  . Semaglutide,0.25 or 0.5MG /DOS, (OZEMPIC, 0.25 OR 0.5 MG/DOSE,) 2 MG/1.5ML SOPN Inject 0.25 mg into the skin once a week for 14 days, THEN 0.5 mg once a week. 1.5 mL 11  . sildenafil (VIAGRA) 50 MG tablet TAKE 1 TABLET BY MOUTH ONCE DAILY AS NEEDED FOR ERECTILE DYSFUNCTION 6 tablet 0  . glucose blood (BAYER CONTOUR NEXT TEST) test strip Use as instructed (Patient not taking: Reported on 03/25/2020) 100 each 5  . Lancets (ONETOUCH ULTRASOFT) lancets Use as instructed (Patient not taking: Reported on 03/25/2020) 100 each 5  . metoprolol succinate (TOPROL-XL) 25 MG 24 hr tablet Take 1 tablet (25 mg total) by mouth daily. (Patient not taking: Reported on 03/25/2020) 30 tablet 3   No current facility-administered medications for this visit.     Past Medical History:  Diagnosis Date  . CHF (congestive heart failure) (HCC)   . Diabetes mellitus without complication (HCC) 07/10/13   referred to DSME,  did not show (04/2014)  . GERD (gastroesophageal reflux disease)   . HFrEF (heart failure with reduced ejection fraction) (HCC)    a. 11/2017 Echo: EF 20-25%, diff HK;  b. 02/2018 Echo: EF 30-35%, diff HK  . Hypertension   . Marijuana abuse   . Morbidly obese (HCC) 03/24/2014  . NICM (nonischemic cardiomyopathy) (HCC)    a. 11/2017 Echo: EF 20-25%, diff HK, mild MR, mildy dil LA, PASP ; b. 11/2017 Cath: nl cors. PCWP . CO/CI 4.8/1.79; c. 02/2018 Echo: EF 30-35%, diff HK, mildly dil LA, nl RV fxn. PASP .  . OSA (obstructive sleep apnea) 10/29/2013  . Tobacco abuse   . Wolff-Parkinson-White (WPW) syndrome    a. 11/2017 Admitted w/ SVT @ 190 bpm; b. 12/2017 EPS w/RFCA: right mid septal accessory pathway <1cm from AV node. Ablation performed but developed recurrent WPW post-procedure and placed on Sotalol.    ROS:   All systems reviewed and negative except as noted in the HPI.   Past Surgical History:  Procedure Laterality Date  . FRACTURE SURGERY Left 2002   fractured arm  . HERNIA REPAIR  12-16-13   umbilical  . PVC ABLATION N/A 12/22/2017   Procedure: WPW  ABLATION;  Surgeon: Marinus Maw, MD;  Location: [facility];  Service: Cardiovascular;  Laterality: N/A;  . RIGHT/LEFT HEART CATH AND CORONARY ANGIOGRAPHY N/A  12/18/2017   Procedure: RIGHT/LEFT HEART CATH AND CORONARY ANGIOGRAPHY;  Surgeon: Iran Ouch, MD;  Location: [facility] INVASIVE CV LAB;  Service: Cardiovascular;  Laterality: N/A;     Family History  Problem Relation Age of Onset  . Diabetes Maternal Grandmother   . Heart attack Maternal Grandmother 80  . Diabetes Paternal Grandfather      Social History   Socioeconomic History  . Marital status: Single    Spouse name: Not on file  . Number of children: 2  . Years of education: Not on file  . Highest education level: Not on file  Occupational History  . Occupation: Advertising account executive: SELF-EMPLOYED    Comment:  [employer]  Tobacco Use  . Smoking status: Former Smoker    Packs/day: 0.25    Years: 15.00    Pack years: 3.75    Types: Cigarettes    Quit date: 2019    Years since quitting: 3.0  . Smokeless tobacco: Never Used  Vaping Use  . Vaping Use: Never used  Substance and Sexual Activity  . Alcohol use: Yes    Alcohol/week: 0.0 standard drinks    Comment: Occassional beer, shots of liquor  . Drug use: Yes    Frequency: 2.0 times per week    Types: Marijuana    Comment: 1 time per week; none in 4-6 months.  . Sexual activity: Not on file  Other Topics Concern  . Not on file  Social History Narrative   Lives with girlfriend Alvin Phillips grew up in [place]. He is currently living in [place]. He has a daughter and son Alvin Phillips, Alvin Phillips). He breeds dogs Medical sales representative).   Occ: Teaching laboratory technician rep   Social Determinants of Corporate investment banker Strain: Not on file  Food Insecurity: Not on file  Transportation Needs: Not on file  Physical Activity: Not on file  Stress: Not on file  Social Connections: Not on file  Intimate Partner Violence: Not on file     BP (!) 152/100   Pulse 80   Ht 5\' 10"  (1.778 m)   Wt (!) 347 lb (157.4 kg)   SpO2 98%   BMI 49.79 kg/m   Physical Exam:  Well appearing NAD HEENT: Unremarkable Neck:  No JVD, no thyromegally Lymphatics:  No adenopathy Back:  No CVA tenderness Lungs:  Clear with no wheezes HEART:  Regular rate rhythm, no murmurs, no rubs, no clicks Abd:  soft, positive bowel sounds, no organomegally, no rebound, no guarding Ext:  2 plus pulses, no edema, no cyanosis, no clubbing Skin:  No rashes no nodules Neuro:  CN II through XII intact, motor grossly intact  Assess/Plan: 1. WPW - he is minimally symptomatic and will restart his beta blocker and uptitrate to 50 mg daily. 2. Chronic systolic heart failure - his EF is 30% by echo but he is class 1. I have asked him to restart and take his meds. 3. Obesity  - he is encouraged to lose more weight.  4. HTN - his bp is elevated but he has just restarted his meds. We will follow. A low sodium diet is recommended.  Alvin Mcelmurry,MD

## 2020-03-25 NOTE — Patient Instructions (Signed)
Medication Instructions:  Your physician has recommended you make the following change in your medication:  1- START Entresto 24/26 mg by mouth two times a day. 2- Metoprolol succinate 25 mg by mouth once a day.  3- STOP Sotalol.  *If you need a refill on your cardiac medications before your next appointment, please call your pharmacy*  Lab Work: none If you have labs (blood work) drawn today and your tests are completely normal, you will receive your results only by: Marland Kitchen MyChart Message (if you have MyChart) OR . A paper copy in the mail If you have any lab test that is abnormal or we need to change your treatment, we will call you to review the results.  Testing/Procedures: none  Follow-Up:  You have been referred to Dr Lewayne Bunting for follow up.    At [facility], you and your health needs are our priority.  As part of our continuing mission to provide you with exceptional heart care, we have created designated Provider Care Teams.  These Care Teams include your primary Cardiologist (physician) and Advanced Practice Providers (APPs -  Physician Assistants and Nurse Practitioners) who all work together to provide you with the care you need, when you need it.  We recommend signing up for the patient portal called "MyChart".  Sign up information is provided on this After Visit Summary.  MyChart is used to connect with patients for Virtual Visits (Telemedicine).  Patients are able to view lab/test results, encounter notes, upcoming appointments, etc.  Non-urgent messages can be sent to your provider as well.   To learn more about what you can do with MyChart, go to [email or web address].    Your next appointment:   6 week(s)  The format for your next appointment:   In Person  Provider:   You will see one of the following Advanced Practice Providers on your designated Care Team:    Nicolasa Ducking, NP  Eula Listen, PA-C  Marisue Ivan, PA-C  Cadence Bellmont,  [place]  Gillian Shields, NP

## 2020-03-29 ENCOUNTER — Other Ambulatory Visit: Payer: Self-pay | Admitting: Family Medicine

## 2020-05-02 NOTE — Progress Notes (Deleted)
Cardiology Office Note    Date:  05/02/2020   ID:  Alvin Phillips, DOB 13-Feb-1981, MRN [id number]  PCP:  Eustaquio Boyden, MD  Cardiologist:  Yvonne Kendall, MD  Electrophysiologist:  Lewayne Bunting, MD   Chief Complaint: Follow-up  History of Present Illness:   Alvin Phillips is a 40 y.o. male with history of Wolff-Parkinson-White syndrome status post ablation in 12/2017, HFrEF secondary to NICM, pulmonary hypertension, uncontrolled diabetes mellitus, HTN, HLD, morbid obesity, sleep apnea, medication nonadherence, and GERD who presents for follow-up of his cardiomyopathy.  He was admitted to the hospital in 11/2017 with persistent tachycardia and dyspnea.  He was found to be in SVT with a rate of 190 bpm.  He converted spontaneously and was noted to have delta wave consistent with WPW.  CTA of the chest was negative for PE though did show interstitial edema.  Echo showed an EF of 20 to 25% with diffuse hypokinesis.  Diagnostic R/LHC showed normal coronary arteries with severely elevated right heart pressures with a pulmonary capillary wedge pressure of 33.  He was transferred to [facility] and underwent WPW catheter ablation to within 1 cm of the AV node.  This was initially successful though he was subsequently noted have recurrent delta waves and was subsequently placed on sotalol.  Most recent echo from 02/2018 showed an EF of 30 to 35%, diffuse hypokinesis, mildly dilated left atrium, normal RV systolic function, PASP 44 mmHg.  He was last seen in our office on 03/25/2020 having lost approximately 30 pounds over the prior year with improved dietary change following significant weight gain during the COVID-19 pandemic.  He was inconsistently taking medications over the preceding several months and had been off all medications for 1 to 2 months prior to his of resuming them.  He did note sporadic palpitations without associated symptoms.  He was taking sotalol inconsistently.  This was discontinued out  of concern for QT prolongation and proarrhythmic effects with inconsistent use.  He was advised to follow-up with Dr. Ladona Ridgel for further evaluation.  He was restarted on Entresto and low-dose metoprolol was added in an effort to optimize GDMT.  He was evaluated by Dr. Ladona Ridgel on the same day at which time it was recommended he restart his beta-blocker and uptitrate this to 50 mg daily.  ***   Labs independently reviewed: 01/2020 - A1c 14, potassium 4.2, BUN 12, serum creatinine 0.75, direct LDL 109 11/2018 - Hgb 12.7, PLT 315 12/2017 - magnesium 1.8 11/2017 - TC 163, TG 215, HDL 27, LDL 93, albumin 3.5, AST 46, ALT 57  Past Medical History:  Diagnosis Date  . CHF (congestive heart failure) (HCC)   . Diabetes mellitus without complication (HCC) 07/10/13   referred to DSME, did not show (04/2014)  . GERD (gastroesophageal reflux disease)   . HFrEF (heart failure with reduced ejection fraction) (HCC)    a. 11/2017 Echo: EF 20-25%, diff HK;  b. 02/2018 Echo: EF 30-35%, diff HK  . Hypertension   . Marijuana abuse   . Morbidly obese (HCC) 03/24/2014  . NICM (nonischemic cardiomyopathy) (HCC)    a. 11/2017 Echo: EF 20-25%, diff HK, mild MR, mildy dil LA, PASP ; b. 11/2017 Cath: nl cors. PCWP . CO/CI 4.8/1.79; c. 02/2018 Echo: EF 30-35%, diff HK, mildly dil LA, nl RV fxn. PASP .  . OSA (obstructive sleep apnea) 10/29/2013  . Tobacco abuse   . Wolff-Parkinson-White (WPW) syndrome    a. 11/2017 Admitted w/ SVT @  190 bpm; b. 12/2017 EPS w/RFCA: right mid septal accessory pathway <1cm from AV node. Ablation performed but developed recurrent WPW post-procedure and placed on Sotalol.    Past Surgical History:  Procedure Laterality Date  . FRACTURE SURGERY Left 2002   fractured arm  . HERNIA REPAIR  12-16-13   umbilical  . PVC ABLATION N/A 12/22/2017   Procedure: WPW  ABLATION;  Surgeon: Marinus Maw, MD;  Location: [facility];  Service: Cardiovascular;  Laterality: N/A;  .  RIGHT/LEFT HEART CATH AND CORONARY ANGIOGRAPHY N/A 12/18/2017   Procedure: RIGHT/LEFT HEART CATH AND CORONARY ANGIOGRAPHY;  Surgeon: Iran Ouch, MD;  Location: [facility] INVASIVE CV LAB;  Service: Cardiovascular;  Laterality: N/A;    Current Medications: No outpatient medications have been marked as taking for the 05/06/20 encounter (Appointment) with Sondra Barges, PA-C.    Allergies:   Metformin and related and Sulfa antibiotics   Social History   Socioeconomic History  . Marital status: Single    Spouse name: Not on file  . Number of children: 2  . Years of education: Not on file  . Highest education level: Not on file  Occupational History  . Occupation: Advertising account executive: SELF-EMPLOYED    Comment: [employer]  Tobacco Use  . Smoking status: Former Smoker    Packs/day: 0.25    Years: 15.00    Pack years: 3.75    Types: Cigarettes    Quit date: 2019    Years since quitting: 3.1  . Smokeless tobacco: Never Used  Vaping Use  . Vaping Use: Never used  Substance and Sexual Activity  . Alcohol use: Yes    Alcohol/week: 0.0 standard drinks    Comment: Occassional beer, shots of liquor  . Drug use: Yes    Frequency: 2.0 times per week    Types: Marijuana    Comment: 1 time per week; none in 4-6 months.  . Sexual activity: Not on file  Other Topics Concern  . Not on file  Social History Narrative   Lives with girlfriend Reis Goga grew up in [place]. He is currently living in [place]. [name], [place]). He breeds dogs Medical sales representative).   Occ: Teaching laboratory technician rep   Social Determinants of Health   Financial Resource Strain: Not on file  Food Insecurity: Not on file  Transportation Needs: Not on file  Physical Activity: Not on file  Stress: Not on file  Social Connections: Not on file     Family History:  The patient's family history includes Diabetes in his maternal grandmother and paternal  grandfather; Heart attack (age of onset: 75) in his maternal grandmother.  ROS:   ROS   EKGs/Labs/Other Studies Reviewed:    Studies reviewed were summarized above. The additional studies were reviewed today:  2D echo 02/2018: - Left ventricle: The cavity size was moderately dilated. There was  mild concentric hypertrophy. Systolic function was moderately to  severely reduced. The estimated ejection fraction was in the  range of 30% to 35%. Diffuse hypokinesis. Regional wall motion  abnormalities cannot be excluded.  - Left atrium: The atrium was mildly dilated.  - Right ventricle: Systolic function was normal.  - Pulmonary arteries: Systolic pressure was mildly elevated. PA  peak pressure: 44 mm Hg (S). __________  WPW ablation 12/2017: Conclusion: 1.  Manifest WPW with a right mid septal accessory pathway, mapped to within 1 cm of the  AV node 2.  Successful EP study and catheter ablation of the manifest right mid septal accessory pathway rendering pathway conduction noninducible 3.  No additional pathway conduction or inducible arrhythmias. __________  [facility] 11/2017:  1.  Normal coronary arteries. 2.  Severely reduced LV systolic function by echo.  Left ventricular angiography was not performed. 3.  Right heart catheterization showed severely elevated filling pressures with pulmonary capillary wedge pressure of 33 mmHg, moderate to severe pulmonary hypertension and moderately reduced cardiac output at 4.8 with a cardiac index of 1.79.  Recommendations: The patient has nonischemic cardiomyopathy.  He continues to be significantly volume overloaded.  I recommend continuing IV diuresis for at least another day.  I switch losartan to Entresto. __________  2D echo 11/2017: - Left ventricle: The cavity size was severely dilated. There was  mild concentric hypertrophy. Systolic function was severely  reduced. The estimated ejection fraction was in the range of 20%   to 25%. Diffuse hypokinesis. Regional wall motion abnormalities  cannot be excluded.  - Mitral valve: There was mild regurgitation.  - Left atrium: The atrium was mildly dilated.  - Right ventricle: Poorly visualized. Systolic function was mildly  reduced.  - Pulmonary arteries: Systolic pressure was mildly elevated. PA  peak pressure: 36 mm Hg (S).   EKG:  EKG is ordered today.  The EKG ordered today demonstrates ***  Recent Labs: 02/10/2020: BUN 12; Creatinine, Ser 0.75; Potassium 4.2; Sodium 136  Recent Lipid Panel    Component Value Date/Time   CHOL 163 12/17/2017 0535   TRIG 215 (H) 12/17/2017 0535   HDL 27 (L) 12/17/2017 0535   CHOLHDL 6.0 12/17/2017 0535   VLDL 43 (H) 12/17/2017 0535   LDLCALC 93 12/17/2017 0535   LDLDIRECT 109.0 02/10/2020 1009    PHYSICAL EXAM:    VS:  There were no vitals taken for this visit.  BMI: There is no height or weight on file to calculate BMI.  Physical Exam  Wt Readings from Last 3 Encounters:  03/25/20 (!) 347 lb (157.4 kg)  03/25/20 (!) 348 lb (157.9 kg)  02/10/20 (!) 345 lb (156.5 kg)     ASSESSMENT & PLAN:   1. HFpEF secondary to NICM:  2. Pulmonary hypertension:  3. Wolff-Parkinson-White syndrome:  4. HTN: Blood pressure ***  5. Morbid obesity:  6. OSA:  7. Poorly controlled diabetes:  Disposition: F/u with Dr. Okey Dupre or an APP in ***, and EP as directed.   Medication Adjustments/Labs and Tests Ordered: Current medicines are reviewed at length with the patient today.  Concerns regarding medicines are outlined above. Medication changes, Labs and Tests ordered today are summarized above and listed in the Patient Instructions accessible in Encounters.   Signed, Eula Listen, PA-C 05/02/2020 9:40 AM     [facility] [address] [place], [place] [address] [phone]

## 2020-05-06 ENCOUNTER — Ambulatory Visit: Payer: 59 | Admitting: Physician Assistant

## 2020-05-06 NOTE — Progress Notes (Deleted)
Cardiology Office Note    Date:  05/06/2020   ID:  Alvin Phillips, DOB [birth year], MRN [id number]  PCP:  Eustaquio Boyden, MD  Cardiologist:  Yvonne Kendall, MD  Electrophysiologist:  Lewayne Bunting, MD   Chief Complaint: Follow up  History of Present Illness:   Alvin Phillips is a 40 y.o. male with history of Wolff-Parkinson-White syndrome status post ablation in 12/2017, HFrEF secondary to NICM, pulmonary hypertension, uncontrolled diabetes mellitus, HTN, HLD, morbid obesity, sleep apnea, medication nonadherence, and GERD who presents for follow-up of his cardiomyopathy.  He was admitted to the hospital in 11/2017 with persistent tachycardia and dyspnea.  He was found to be in SVT with a rate of 190 bpm.  He converted spontaneously and was noted to have delta wave consistent with WPW.  CTA of the chest was negative for PE though did show interstitial edema.  Echo showed an EF of 20 to 25% with diffuse hypokinesis.  Diagnostic R/LHC showed normal coronary arteries with severely elevated right heart pressures with a pulmonary capillary wedge pressure of 33.  He was transferred to [facility] and underwent WPW catheter ablation to within 1 cm of the AV node.  This was initially successful though he was subsequently noted have recurrent delta waves and was subsequently placed on sotalol.  Most recent echo from 02/2018 showed an EF of 30 to 35%, diffuse hypokinesis, mildly dilated left atrium, normal RV systolic function, PASP 44 mmHg.  He was last seen in our office on 03/25/2020 having lost approximately 30 pounds over the prior year with improved dietary change following significant weight gain during the COVID-19 pandemic.  He was inconsistently taking medications over the preceding several months and had been off all medications for 1 to 2 months prior to his of resuming them.  He did note sporadic palpitations without associated symptoms.  He was taking sotalol inconsistently.  This was discontinued out  of concern for QT prolongation and proarrhythmic effects with inconsistent use.  He was advised to follow-up with Dr. Ladona Ridgel for further evaluation.  He was restarted on Entresto and low-dose metoprolol was added in an effort to optimize GDMT.  He was evaluated by Dr. Ladona Ridgel on the same day at which time it was recommended he restart his beta-blocker and uptitrate this to 50 mg daily.  ***   Labs independently reviewed: 01/2020 - A1c 14, potassium 4.2, BUN 12, serum creatinine 0.75, direct LDL 109 11/2018 - Hgb 12.7, PLT 315 12/2017 - magnesium 1.8 11/2017 - TC 163, TG 215, HDL 27, LDL 93, albumin 3.5, AST 46, ALT 57    Past Medical History:  Diagnosis Date  . CHF (congestive heart failure) (HCC)   . Diabetes mellitus without complication (HCC) 07/10/13   referred to DSME, did not show (04/2014)  . GERD (gastroesophageal reflux disease)   . HFrEF (heart failure with reduced ejection fraction) (HCC)    a. 11/2017 Echo: EF 20-25%, diff HK;  b. 02/2018 Echo: EF 30-35%, diff HK  . Hypertension   . Marijuana abuse   . Morbidly obese (HCC) 03/24/2014  . NICM (nonischemic cardiomyopathy) (HCC)    a. 11/2017 Echo: EF 20-25%, diff HK, mild MR, mildy dil LA, PASP ; b. 11/2017 Cath: nl cors. PCWP . CO/CI 4.8/1.79; c. 02/2018 Echo: EF 30-35%, diff HK, mildly dil LA, nl RV fxn. PASP .  . OSA (obstructive sleep apnea) 10/29/2013  . Tobacco abuse   . Wolff-Parkinson-White (WPW) syndrome    a. 11/2017 Admitted  w/ SVT @ 190 bpm; b. 12/2017 EPS w/RFCA: right mid septal accessory pathway <1cm from AV node. Ablation performed but developed recurrent WPW post-procedure and placed on Sotalol.    Past Surgical History:  Procedure Laterality Date  . FRACTURE SURGERY Left 2002   fractured arm  . HERNIA REPAIR  12-16-13   umbilical  . PVC ABLATION N/A 12/22/2017   Procedure: WPW  ABLATION;  Surgeon: Marinus Maw, MD;  Location: [facility];  Service: Cardiovascular;  Laterality: N/A;  .  RIGHT/LEFT HEART CATH AND CORONARY ANGIOGRAPHY N/A 12/18/2017   Procedure: RIGHT/LEFT HEART CATH AND CORONARY ANGIOGRAPHY;  Surgeon: Iran Ouch, MD;  Location: [facility] INVASIVE CV LAB;  Service: Cardiovascular;  Laterality: N/A;    Current Medications: No outpatient medications have been marked as taking for the 05/08/20 encounter (Appointment) with Sondra Barges, PA-C.    Allergies:   Metformin and related and Sulfa antibiotics   Social History   Socioeconomic History  . Marital status: Single    Spouse name: Not on file  . Number of children: 2  . Years of education: Not on file  . Highest education level: Not on file  Occupational History  . Occupation: Advertising account executive: SELF-EMPLOYED    Comment: [employer]  Tobacco Use  . Smoking status: Former Smoker    Packs/day: 0.25    Years: 15.00    Pack years: 3.75    Types: Cigarettes    Quit date: 2019    Years since quitting: 3.1  . Smokeless tobacco: Never Used  Vaping Use  . Vaping Use: Never used  Substance and Sexual Activity  . Alcohol use: Yes    Alcohol/week: 0.0 standard drinks    Comment: Occassional beer, shots of liquor  . Drug use: Yes    Frequency: 2.0 times per week    Types: Marijuana    Comment: 1 time per week; none in 4-6 months.  . Sexual activity: Not on file  Other Topics Concern  . Not on file  Social History Narrative   Lives with girlfriend [name] grew up in [place]. He is currently living in [place]. [name], [place]). He breeds dogs Medical sales representative).   Occ: Teaching laboratory technician rep   Social Determinants of Health   Financial Resource Strain: Not on file  Food Insecurity: Not on file  Transportation Needs: Not on file  Physical Activity: Not on file  Stress: Not on file  Social Connections: Not on file     Family History:  The patient's family history includes Diabetes in his maternal grandmother and paternal  grandfather; Heart attack (age of onset: 36) in his maternal grandmother.  ROS:   ROS   EKGs/Labs/Other Studies Reviewed:    Studies reviewed were summarized above. The additional studies were reviewed today:  2D echo 02/2018: - Left ventricle: The cavity size was moderately dilated. There was  mild concentric hypertrophy. Systolic function was moderately to  severely reduced. The estimated ejection fraction was in the  range of 30% to 35%. Diffuse hypokinesis. Regional wall motion  abnormalities cannot be excluded.  - Left atrium: The atrium was mildly dilated.  - Right ventricle: Systolic function was normal.  - Pulmonary arteries: Systolic pressure was mildly elevated. PA  peak pressure: 44 mm Hg (S). __________  WPW ablation 12/2017: Conclusion: 1.  Manifest WPW with a right mid septal accessory pathway, mapped to within 1  cm of the AV node 2.  Successful EP study and catheter ablation of the manifest right mid septal accessory pathway rendering pathway conduction noninducible 3.  No additional pathway conduction or inducible arrhythmias. __________  [facility] 11/2017:  1.  Normal coronary arteries. 2.  Severely reduced LV systolic function by echo.  Left ventricular angiography was not performed. 3.  Right heart catheterization showed severely elevated filling pressures with pulmonary capillary wedge pressure of 33 mmHg, moderate to severe pulmonary hypertension and moderately reduced cardiac output at 4.8 with a cardiac index of 1.79.  Recommendations: The patient has nonischemic cardiomyopathy.  He continues to be significantly volume overloaded.  I recommend continuing IV diuresis for at least another day.  I switch losartan to Entresto. __________  2D echo 11/2017: - Left ventricle: The cavity size was severely dilated. There was  mild concentric hypertrophy. Systolic function was severely  reduced. The estimated ejection fraction was in the range of 20%   to 25%. Diffuse hypokinesis. Regional wall motion abnormalities  cannot be excluded.  - Mitral valve: There was mild regurgitation.  - Left atrium: The atrium was mildly dilated.  - Right ventricle: Poorly visualized. Systolic function was mildly  reduced.  - Pulmonary arteries: Systolic pressure was mildly elevated. PA  peak pressure: 36 mm Hg (S).   EKG:  EKG is ordered today.  The EKG ordered today demonstrates ***  Recent Labs: 02/10/2020: BUN 12; Creatinine, Ser 0.75; Potassium 4.2; Sodium 136  Recent Lipid Panel    Component Value Date/Time   CHOL 163 12/17/2017 0535   TRIG 215 (H) 12/17/2017 0535   HDL 27 (L) 12/17/2017 0535   CHOLHDL 6.0 12/17/2017 0535   VLDL 43 (H) 12/17/2017 0535   LDLCALC 93 12/17/2017 0535   LDLDIRECT 109.0 02/10/2020 1009    PHYSICAL EXAM:    VS:  There were no vitals taken for this visit.  BMI: There is no height or weight on file to calculate BMI.  Physical Exam  Wt Readings from Last 3 Encounters:  03/25/20 (!) 347 lb (157.4 kg)  03/25/20 (!) 348 lb (157.9 kg)  02/10/20 (!) 345 lb (156.5 kg)     ASSESSMENT & PLAN:   1. HFrEF secondary to NICM:  2. WPW:  3. HTN: Blood pressure ***  4. Morbid obesity:  5. OSA:  6. Medication nonadherence:  Disposition: F/u with Dr. Okey Dupre or an APP in ***, and EP as directed.   Medication Adjustments/Labs and Tests Ordered: Current medicines are reviewed at length with the patient today.  Concerns regarding medicines are outlined above. Medication changes, Labs and Tests ordered today are summarized above and listed in the Patient Instructions accessible in Encounters.   Signed, Eula Listen, PA-C 05/06/2020 9:02 AM     [facility] [address] [place], [place] [address] [phone]

## 2020-05-08 ENCOUNTER — Ambulatory Visit: Payer: 59 | Admitting: Physician Assistant

## 2020-05-12 ENCOUNTER — Ambulatory Visit: Payer: 59 | Admitting: Family Medicine

## 2020-05-12 ENCOUNTER — Telehealth: Payer: Self-pay

## 2020-05-12 NOTE — Telephone Encounter (Signed)
[facility] Night - Client Nonclinical Telephone Record AccessNurse Client [facility] Night - Client Client Site [facility] - Night Physician Eustaquio Boyden - MD Contact Type Call Who Is Calling Patient / Member / Family / Caregiver Caller Name Alvin Phillips Caller Phone Number [phone] Patient Name Alvin Phillips Patient DOB 10-21-1980 Call Type Message Only Information Provided Reason for Call Request to Reschedule Office Appointment Initial Comment Caller states he is needing to reschedule an appointment. Disp. Time Disposition Final User 05/12/2020 7:42:17 AM General Information Provided Yes Bryon Lions, [place] Call Closed By: Richrd Humbles Transaction Date/Time: 05/12/2020 7:40:42 AM (ET)

## 2020-05-12 NOTE — Telephone Encounter (Signed)
Patient has been rescheduled. EM

## 2020-05-15 NOTE — Progress Notes (Deleted)
Cardiology Office Note    Date:  05/15/2020   ID:  Alvin Phillips, DOB 1980-08-19, MRN [id number]  PCP:  Alvin Boyden, MD  Cardiologist:  Alvin Kendall, MD  Electrophysiologist:  Alvin Bunting, MD   Chief Complaint: Follow-up  History of Present Illness:   Alvin Phillips is a 40 y.o. male with history of Wolff-Parkinson-White syndrome status post ablation in 12/2017, HFrEF secondary to NICM, pulmonary hypertension, uncontrolled diabetes mellitus, HTN, HLD, morbid obesity, sleep apnea, medication nonadherence, and GERD who presents for follow-up of his cardiomyopathy.  He was admitted to the hospital in 11/2017 with persistent tachycardia and dyspnea.  He was found to be in SVT with a rate of 190 bpm.  He converted spontaneously and was noted to have delta wave consistent with WPW.  CTA of the chest was negative for PE though did show interstitial edema.  Echo showed an EF of 20 to 25% with diffuse hypokinesis.  Diagnostic R/LHC showed normal coronary arteries with severely elevated right heart pressures with a pulmonary capillary wedge pressure of 33.  He was transferred to [facility] and underwent WPW catheter ablation to within 1 cm of the AV node.  This was initially successful though he was subsequently noted have recurrent delta waves and was subsequently placed on sotalol.  Most recent echo from 02/2018 showed an EF of 30 to 35%, diffuse hypokinesis, mildly dilated left atrium, normal RV systolic function, PASP 44 mmHg.  He was last seen in our office on 03/25/2020 having lost approximately 30 pounds over the prior year with improved dietary change following significant weight gain during the COVID-19 pandemic.  He was inconsistently taking medications over the preceding several months and had been off all medications for 1 to 2 months prior to his of resuming them.  He did note sporadic palpitations without associated symptoms.  He was taking sotalol inconsistently.  This was discontinued out  of concern for QT prolongation and proarrhythmic effects with inconsistent use.  He was advised to follow-up with Dr. Ladona Ridgel for further evaluation.  He was restarted on Entresto and low-dose metoprolol was added in an effort to optimize GDMT.  He was evaluated by Dr. Ladona Ridgel on the same day at which time it was recommended he restart his beta-blocker and uptitrate this to 50 mg daily.  ***   Labs independently reviewed: 01/2020 - A1c 14, potassium 4.2, BUN 12, serum creatinine 0.75, direct LDL 109 11/2018 - Hgb 12.7, PLT 315 12/2017 - magnesium 1.8 11/2017 - TC 163, TG 215, HDL 27, LDL 93, albumin 3.5, AST 46, ALT 57    Past Medical History:  Diagnosis Date  . CHF (congestive heart failure) (HCC)   . Diabetes mellitus without complication (HCC) 07/10/13   referred to DSME, did not show (04/2014)  . GERD (gastroesophageal reflux disease)   . HFrEF (heart failure with reduced ejection fraction) (HCC)    a. 11/2017 Echo: EF 20-25%, diff HK;  b. 02/2018 Echo: EF 30-35%, diff HK  . Hypertension   . Marijuana abuse   . Morbidly obese (HCC) 03/24/2014  . NICM (nonischemic cardiomyopathy) (HCC)    a. 11/2017 Echo: EF 20-25%, diff HK, mild MR, mildy dil LA, PASP ; b. 11/2017 Cath: nl cors. PCWP . CO/CI 4.8/1.79; c. 02/2018 Echo: EF 30-35%, diff HK, mildly dil LA, nl RV fxn. PASP .  . OSA (obstructive sleep apnea) 10/29/2013  . Tobacco abuse   . Wolff-Parkinson-White (WPW) syndrome    a. 11/2017 Admitted w/  SVT @ 190 bpm; b. 12/2017 EPS w/RFCA: right mid septal accessory pathway <1cm from AV node. Ablation performed but developed recurrent WPW post-procedure and placed on Sotalol.    Past Surgical History:  Procedure Laterality Date  . FRACTURE SURGERY Left 2002   fractured arm  . HERNIA REPAIR  12-16-13   umbilical  . PVC ABLATION N/A 12/22/2017   Procedure: WPW  ABLATION;  Surgeon: Alvin Maw, MD;  Location: [facility]VAS[facility] CV LAB;  Service: Cardiovascular;  Laterality: N/A;  .  RIGHT/LEFT HEART CATH AND CORONARY ANGIOGRAPHY N/A 12/18/2017   Procedure: RIGHT/LEFT HEART CATH AND CORONARY ANGIOGRAPHY;  Surgeon: Alvin Ouch, MD;  Location: [facility] INVASIVE CV LAB;  Service: Cardiovascular;  Laterality: N/A;    Current Medications: No outpatient medications have been marked as taking for the 05/29/20 encounter (Appointment) with Alvin Barges, PA-C.    Allergies:   Metformin and related and Sulfa antibiotics   Social History   Socioeconomic History  . Marital status: Single    Spouse name: Not on file  . Number of children: 2  . Years of education: Not on file  . Highest education level: Not on file  Occupational History  . Occupation: Advertising account executive: SELF-EMPLOYED    Comment: [employer]  Tobacco Use  . Smoking status: Former Smoker    Packs/day: 0.25    Years: 15.00    Pack years: 3.75    Types: Cigarettes    Quit date: 2019    Years since quitting: 3.1  . Smokeless tobacco: Never Used  Vaping Use  . Vaping Use: Never used  Substance and Sexual Activity  . Alcohol use: Yes    Alcohol/week: 0.0 standard drinks    Comment: Occassional beer, shots of liquor  . Drug use: Yes    Frequency: 2.0 times per week    Types: Marijuana    Comment: 1 time per week; none in 4-6 months.  . Sexual activity: Not on file  Other Topics Concern  . Not on file  Social History Narrative   Lives with girlfriend Alvin Phillips grew up in [place]. He is currently living in [place]. He has a daughter and son Alvin Phillips, Alvin Phillips). He breeds dogs Medical sales representative).   Occ: Teaching laboratory technician rep   Social Determinants of Health   Financial Resource Strain: Not on file  Food Insecurity: Not on file  Transportation Needs: Not on file  Physical Activity: Not on file  Stress: Not on file  Social Connections: Not on file     Family History:  The patient's family history includes Diabetes in his maternal grandmother and paternal  grandfather; Heart attack (age of onset: 58) in his maternal grandmother.  ROS:   ROS   EKGs/Labs/Other Studies Reviewed:    Studies reviewed were summarized above. The additional studies were reviewed today:  2D echo 02/2018: - Left ventricle: The cavity size was moderately dilated. There was  mild concentric hypertrophy. Systolic function was moderately to  severely reduced. The estimated ejection fraction was in the  range of 30% to 35%. Diffuse hypokinesis. Regional wall motion  abnormalities cannot be excluded.  - Left atrium: The atrium was mildly dilated.  - Right ventricle: Systolic function was normal.  - Pulmonary arteries: Systolic pressure was mildly elevated. PA  peak pressure: 44 mm Hg (S). __________  WPW ablation 12/2017: Conclusion: 1.  Manifest WPW with a right mid septal accessory pathway, mapped to within 1 cm  of the AV node 2.  Successful EP study and catheter ablation of the manifest right mid septal accessory pathway rendering pathway conduction noninducible 3.  No additional pathway conduction or inducible arrhythmias. __________  [facility] 11/2017:  1.  Normal coronary arteries. 2.  Severely reduced LV systolic function by echo.  Left ventricular angiography was not performed. 3.  Right heart catheterization showed severely elevated filling pressures with pulmonary capillary wedge pressure of 33 mmHg, moderate to severe pulmonary hypertension and moderately reduced cardiac output at 4.8 with a cardiac index of 1.79.  Recommendations: The patient has nonischemic cardiomyopathy.  He continues to be significantly volume overloaded.  I recommend continuing IV diuresis for at least another day.  I switch losartan to Entresto. __________  2D echo 11/2017: - Left ventricle: The cavity size was severely dilated. There was  mild concentric hypertrophy. Systolic function was severely  reduced. The estimated ejection fraction was in the range of 20%   to 25%. Diffuse hypokinesis. Regional wall motion abnormalities  cannot be excluded.  - Mitral valve: There was mild regurgitation.  - Left atrium: The atrium was mildly dilated.  - Right ventricle: Poorly visualized. Systolic function was mildly  reduced.  - Pulmonary arteries: Systolic pressure was mildly elevated. PA  peak pressure: 36 mm Hg (S).   EKG:  EKG is ordered today.  The EKG ordered today demonstrates ***  Recent Labs: 02/10/2020: BUN 12; Creatinine, Ser 0.75; Potassium 4.2; Sodium 136  Recent Lipid Panel    Component Value Date/Time   CHOL 163 12/17/2017 0535   TRIG 215 (H) 12/17/2017 0535   HDL 27 (L) 12/17/2017 0535   CHOLHDL 6.0 12/17/2017 0535   VLDL 43 (H) 12/17/2017 0535   LDLCALC 93 12/17/2017 0535   LDLDIRECT 109.0 02/10/2020 1009    PHYSICAL EXAM:    VS:  There were no vitals taken for this visit.  BMI: There is no height or weight on file to calculate BMI.  Physical Exam  Wt Readings from Last 3 Encounters:  03/25/20 (!) 347 lb (157.4 kg)  03/25/20 (!) 348 lb (157.9 kg)  02/10/20 (!) 345 lb (156.5 kg)     ASSESSMENT & PLAN:   1. HFrEF secondary to NICM:  2. WPW:  3. HTN: Blood pressure ***  4. Morbid obesity:  5. OSA:  6. Medication nonadherence:  Disposition: F/u with Dr. Marland Kitchen or an APP in ***.   Medication Adjustments/Labs and Tests Ordered: Current medicines are reviewed at length with the patient today.  Concerns regarding medicines are outlined above. Medication changes, Labs and Tests ordered today are summarized above and listed in the Patient Instructions accessible in Encounters.   Signed, Eula Listen, PA-C 05/15/2020 7:52 AM     [facility] [address] [place], [place] [address] [phone]

## 2020-05-20 ENCOUNTER — Encounter: Payer: Self-pay | Admitting: Family Medicine

## 2020-05-20 ENCOUNTER — Ambulatory Visit (INDEPENDENT_AMBULATORY_CARE_PROVIDER_SITE_OTHER): Payer: 59 | Admitting: Family Medicine

## 2020-05-20 ENCOUNTER — Other Ambulatory Visit: Payer: Self-pay

## 2020-05-20 VITALS — BP 156/100 | HR 87 | Temp 97.7°F | Ht 70.0 in | Wt 341.0 lb

## 2020-05-20 DIAGNOSIS — I10 Essential (primary) hypertension: Secondary | ICD-10-CM

## 2020-05-20 DIAGNOSIS — E118 Type 2 diabetes mellitus with unspecified complications: Secondary | ICD-10-CM | POA: Diagnosis not present

## 2020-05-20 DIAGNOSIS — E1165 Type 2 diabetes mellitus with hyperglycemia: Secondary | ICD-10-CM | POA: Diagnosis not present

## 2020-05-20 DIAGNOSIS — E1129 Type 2 diabetes mellitus with other diabetic kidney complication: Secondary | ICD-10-CM

## 2020-05-20 DIAGNOSIS — R10A1 Flank pain, right side: Secondary | ICD-10-CM

## 2020-05-20 DIAGNOSIS — R809 Proteinuria, unspecified: Secondary | ICD-10-CM

## 2020-05-20 DIAGNOSIS — R109 Unspecified abdominal pain: Secondary | ICD-10-CM | POA: Diagnosis not present

## 2020-05-20 DIAGNOSIS — I5020 Unspecified systolic (congestive) heart failure: Secondary | ICD-10-CM

## 2020-05-20 DIAGNOSIS — IMO0002 Reserved for concepts with insufficient information to code with codable children: Secondary | ICD-10-CM

## 2020-05-20 DIAGNOSIS — I456 Pre-excitation syndrome: Secondary | ICD-10-CM

## 2020-05-20 LAB — POC URINALSYSI DIPSTICK (AUTOMATED)
Bilirubin, UA: NEGATIVE
Blood, UA: NEGATIVE
Glucose, UA: NEGATIVE
Ketones, UA: NEGATIVE
Leukocytes, UA: NEGATIVE
Nitrite, UA: NEGATIVE
Protein, UA: POSITIVE — AB
Spec Grav, UA: 1.015 (ref 1.010–1.025)
Urobilinogen, UA: 0.2 E.U./dL
pH, UA: 7.5 (ref 5.0–8.0)

## 2020-05-20 LAB — POCT GLYCOSYLATED HEMOGLOBIN (HGB A1C): Hemoglobin A1C: 8.4 % — AB (ref 4.0–5.6)

## 2020-05-20 MED ORDER — OZEMPIC (1 MG/DOSE) 2 MG/1.5ML ~~LOC~~ SOPN: 1.0000 mg | PEN_INJECTOR | SUBCUTANEOUS | 11 refills | Status: DC

## 2020-05-20 MED ORDER — METOPROLOL SUCCINATE ER 50 MG PO TB24: 50.0000 mg | ORAL_TABLET | Freq: Every day | ORAL | 6 refills | Status: DC

## 2020-05-20 NOTE — Assessment & Plan Note (Addendum)
BP elevated - will increase Toprol XL dose to 50mg  daily and reassess. Continue entresto, renew efforts at BID dosing (forgets evening dose).

## 2020-05-20 NOTE — Patient Instructions (Addendum)
You are doing well with ozempic! A1c was down to 8.4%.  Increase ozempic to 1mg  weekly.  Goal fasting sugar 80-120.  Goal sugar 2 hours after a meal is <180.  Blood pressure was elevated today - double up on metoprolol you have at home to 2 tablets (50mg ) daily, new dose for 50mg  sent to pharmacy.  Flank pain may be muscular.  Return in another 3 months for follow up visit.

## 2020-05-20 NOTE — Progress Notes (Signed)
Patient ID: Alvin Phillips, male    DOB: 07-Mar-1981, 40 y.o.   MRN: [id number]  This visit was conducted in person.  BP (!) 156/100   Pulse 87   Temp 97.7 F (36.5 C) (Temporal)   Ht 5\' 10"  (1.778 m)   Wt (!) 341 lb (154.7 kg)   SpO2 97%   BMI 48.93 kg/m   160/100 on repeat   CC: DM f/u visit, R flank pain  Subjective:   HPI: Alvin Phillips is a 40 y.o. male presenting on 05/20/2020 for Diabetes (Here for 3 mo f/u.) and Flank Pain (C/o right side pain.  Started about 1 wk ago. )   Saw cardiology and EP for chronic HFrEF due to niCM and WPW syndrome s/p ablation. Recommend entresto 24/26 bid and toprol XL 25mg  daily with plan to titrate to 50mg  if tolerated, as well as lasix 40mg  PRN. Once taking regularly, to consider spironolactone and SGLT2 inhibitor. Had not been taking sotalol.   He's been doing better with med compliance - taking toprol XL daily as well as entresto, but sometimes misses 2nd dose.   1 wk ago had nasal congestion, chills, mild cough. Had 2 negative home COVID tests as well as negative PCR.  Took family member's zpack course with benefit.  Last week developed intermittent positional R flank pain better when laying on that side. No UTI symptoms.   DM - does not regularly check sugars. Compliant with antihyperglycemic regimen which includes: ozempic 0.5mg  weekly. Metformin intolerance. Denies low sugars or hypoglycemic symptoms. Denies paresthesias. Last diabetic eye exam DUE. Pneumovax: DUE. Prevnar: not due. Glucometer brand: onetouch. DSME: has not done. Lab Results  Component Value Date   HGBA1C 8.4 (A) 05/20/2020   Diabetic Foot Exam - Simple   Simple Foot Form Diabetic Foot exam was performed with the following findings: Yes 05/20/2020 12:24 PM  Visual Inspection See comments: Yes Sensation Testing Intact to touch and monofilament testing bilaterally: Yes Pulse Check Posterior Tibialis and Dorsalis pulse intact bilaterally: Yes Comments 2+ DP  bilaterally Thickened onychomycotic nails bilaterally     Lab Results  Component Value Date   MICROALBUR 70.6 (H) 02/10/2020        Relevant past medical, surgical, family and social history reviewed and updated as indicated. Interim medical history since our last visit reviewed. Allergies and medications reviewed and updated. Outpatient Medications Prior to Visit  Medication Sig Dispense Refill  . furosemide (LASIX) 40 MG tablet Take 40 mg by mouth as needed.    . Multiple Vitamin (MULTIVITAMIN ADULT PO) Take by mouth.    . sacubitril-valsartan (ENTRESTO) 24-26 MG Take 1 tablet by mouth 2 (two) times daily. 60 tablet 3  . sildenafil (VIAGRA) 50 MG tablet TAKE 1 TABLET BY MOUTH ONCE DAILY AS NEEDED FOR ERECTILE DYSFUNCTION 6 tablet 0  . metoprolol succinate (TOPROL-XL) 25 MG 24 hr tablet Take 1 tablet (25 mg total) by mouth daily. 30 tablet 3  . OZEMPIC, 0.25 OR 0.5 MG/DOSE, 2 MG/1.5ML SOPN Inject into the skin.    . Cyanocobalamin (B-12) 1000 MCG SUBL Place 1 tablet under the tongue daily. (Patient not taking: Reported on 05/20/2020)    . glucose blood (BAYER CONTOUR NEXT TEST) test strip Use as instructed (Patient not taking: No sig reported) 100 each 5  . Lancets (ONETOUCH ULTRASOFT) lancets Use as instructed (Patient not taking: No sig reported) 100 each 5  . pantoprazole (PROTONIX) 20 MG tablet TAKE 1 TABLET BY MOUTH ONCE  DAILY AS NEEDED FOR  HEARTBURN  OR  INDIGESTION (Patient not taking: Reported on 05/20/2020) 30 tablet 5  . Multiple Vitamins-Minerals (EQ MULTIVITAMINS ADULT GUMMY PO) Take 2 Units by mouth daily.     No facility-administered medications prior to visit.     Per HPI unless specifically indicated in ROS section below Review of Systems Objective:  BP (!) 156/100   Pulse 87   Temp 97.7 F (36.5 C) (Temporal)   Ht 5\' 10"  (1.778 m)   Wt (!) 341 lb (154.7 kg)   SpO2 97%   BMI 48.93 kg/m   Wt Readings from Last 3 Encounters:  05/20/20 (!) 341 lb (154.7 kg)   03/25/20 (!) 347 lb (157.4 kg)  03/25/20 (!) 348 lb (157.9 kg)      Physical Exam Vitals and nursing note reviewed.  Constitutional:      General: He is not in acute distress.    Appearance: Normal appearance. He is well-developed and well-nourished. He is obese. He is not ill-appearing.  HENT:     Mouth/Throat:     Mouth: Oropharynx is clear and moist.  Eyes:     General: No scleral icterus.    Extraocular Movements: Extraocular movements intact and EOM normal.     Conjunctiva/sclera: Conjunctivae normal.     Pupils: Pupils are equal, round, and reactive to light.  Cardiovascular:     Rate and Rhythm: Normal rate and regular rhythm.     Pulses: Normal pulses and intact distal pulses.     Heart sounds: Normal heart sounds. No murmur heard.   Pulmonary:     Effort: Pulmonary effort is normal. No respiratory distress.     Breath sounds: Normal breath sounds. No wheezing, rhonchi or rales.  Abdominal:     Tenderness: There is no right CVA tenderness or left CVA tenderness.     Comments: No significant reproducible tenderness to palpation along R flank region  Musculoskeletal:        General: No edema.     Cervical back: Normal range of motion and neck supple.     Right lower leg: No edema.     Left lower leg: No edema.     Comments: See HPI for foot exam if done  Lymphadenopathy:     Cervical: No cervical adenopathy.  Skin:    General: Skin is warm and dry.     Findings: No rash.  Neurological:     Mental Status: He is alert.  Psychiatric:        Mood and Affect: Mood and affect and mood normal.        Behavior: Behavior normal.       Results for orders placed or performed in visit on 05/20/20  POCT glycosylated hemoglobin (Hb A1C)  Result Value Ref Range   Hemoglobin A1C 8.4 (A) 4.0 - 5.6 %   HbA1c POC (<> result, manual entry)     HbA1c, POC (prediabetic range)     HbA1c, POC (controlled diabetic range)    POCT Urinalysis Dipstick (Automated)  Result Value Ref  Range   Color, UA yellow    Clarity, UA clear    Glucose, UA Negative Negative   Bilirubin, UA negative    Ketones, UA negative    Spec Grav, UA 1.015 1.010 - 1.025   Blood, UA negative    pH, UA 7.5 5.0 - 8.0   Protein, UA Positive (A) Negative   Urobilinogen, UA 0.2 0.2 or 1.0 E.U./dL   Nitrite,  UA negative    Leukocytes, UA Negative Negative   Assessment & Plan:  This visit occurred during the SARS-CoV-2 public health emergency.  Safety protocols were in place, including screening questions prior to the visit, additional usage of staff PPE, and extensive cleaning of exam room while observing appropriate contact time as indicated for disinfecting solutions.   Problem List Items Addressed This Visit    WPW (Wolff-Parkinson-White syndrome)    Appreciate EP care - will increase toprol XL dose to 50mg .       Relevant Medications   metoprolol succinate (TOPROL-XL) 50 MG 24 hr tablet   Uncontrolled type 2 diabetes mellitus with microalbuminuria (HCC)   Relevant Medications   Semaglutide, 1 MG/DOSE, (OZEMPIC, 1 MG/DOSE,) 2 MG/1.5ML SOPN   Right flank pain    Benign exam, UA without blood or signs of infection. Anticipate MSK cause like muscle strain - rec tylenol, ibuprofen, heating pad, update if not improving with this.       Relevant Orders   POCT Urinalysis Dipstick (Automated) (Completed)   Obesity, morbid, BMI 40.0-49.9 (HCC)    No significant weight changes despite ozempic - will reassess at 1mg  dose.       Relevant Medications   Semaglutide, 1 MG/DOSE, (OZEMPIC, 1 MG/DOSE,) 2 MG/1.5ML SOPN   HFrEF (heart failure with reduced ejection fraction) (HCC)    Appreciate cards care. Continue entresto and toprol XL      Relevant Medications   metoprolol succinate (TOPROL-XL) 50 MG 24 hr tablet   Essential hypertension    BP elevated - will increase Toprol XL dose to 50mg  daily and reassess. Continue entresto, renew efforts at BID dosing (forgets evening dose).        Relevant Medications   metoprolol succinate (TOPROL-XL) 50 MG 24 hr tablet   Diabetes mellitus type 2, uncontrolled, with complications (HCC) - Primary    Chronic, congratulated on significant improvement since starting ozempic 0.5mg  weekly, tolerating well - will increase dose to 1mg  weekly. RTC 3 mo DM f/u visit. Pt agrees with plan.       Relevant Medications   Semaglutide, 1 MG/DOSE, (OZEMPIC, 1 MG/DOSE,) 2 MG/1.5ML SOPN   Other Relevant Orders   POCT glycosylated hemoglobin (Hb A1C) (Completed)       Meds ordered this encounter  Medications  . Semaglutide, 1 MG/DOSE, (OZEMPIC, 1 MG/DOSE,) 2 MG/1.5ML SOPN    Sig: Inject 1 mg into the skin once a week.    Dispense:  1.5 mL    Refill:  11    Note new sig  . metoprolol succinate (TOPROL-XL) 50 MG 24 hr tablet    Sig: Take 1 tablet (50 mg total) by mouth daily.    Dispense:  30 tablet    Refill:  6    Note new dose   Orders Placed This Encounter  Procedures  . POCT glycosylated hemoglobin (Hb A1C)  . POCT Urinalysis Dipstick (Automated)    Patient Instructions  You are doing well with ozempic! A1c was down to 8.4%.  Increase ozempic to 1mg  weekly.  Goal fasting sugar 80-120.  Goal sugar 2 hours after a meal is <180.  Blood pressure was elevated today - double up on metoprolol you have at home to 2 tablets (50mg ) daily, new dose for 50mg  sent to pharmacy.  Flank pain may be muscular.  Return in another 3 months for follow up visit.    Follow up plan: Return in about 3 months (around 08/20/2020), or if symptoms worsen or  fail to improve, for follow up visit.  Eustaquio Boyden, MD

## 2020-05-20 NOTE — Assessment & Plan Note (Signed)
Chronic, congratulated on significant improvement since starting ozempic 0.5mg  weekly, tolerating well - will increase dose to 1mg  weekly. RTC 3 mo DM f/u visit. Pt agrees with plan.

## 2020-05-20 NOTE — Assessment & Plan Note (Signed)
No significant weight changes despite ozempic - will reassess at 1mg  dose.

## 2020-05-20 NOTE — Assessment & Plan Note (Signed)
Benign exam, UA without blood or signs of infection. Anticipate MSK cause like muscle strain - rec tylenol, ibuprofen, heating pad, update if not improving with this.

## 2020-05-20 NOTE — Assessment & Plan Note (Signed)
Appreciate cards care. Continue entresto and toprol XL

## 2020-05-20 NOTE — Assessment & Plan Note (Signed)
Appreciate EP care - will increase toprol XL dose to 50mg .

## 2020-05-21 ENCOUNTER — Ambulatory Visit: Payer: 59 | Admitting: Internal Medicine

## 2020-05-29 ENCOUNTER — Ambulatory Visit: Payer: 59 | Admitting: Physician Assistant

## 2020-06-17 ENCOUNTER — Ambulatory Visit: Payer: 59 | Admitting: Podiatry

## 2020-09-28 ENCOUNTER — Emergency Department: Payer: 59

## 2020-09-28 ENCOUNTER — Other Ambulatory Visit: Payer: Self-pay

## 2020-09-28 ENCOUNTER — Telehealth: Payer: Self-pay

## 2020-09-28 ENCOUNTER — Encounter: Payer: Self-pay | Admitting: Emergency Medicine

## 2020-09-28 ENCOUNTER — Emergency Department
Admission: EM | Admit: 2020-09-28 | Discharge: 2020-09-28 | Disposition: A | Discharge status: Home / Self Care | Payer: 59 | Attending: Emergency Medicine | Admitting: Emergency Medicine

## 2020-09-28 DIAGNOSIS — E119 Type 2 diabetes mellitus without complications: Secondary | ICD-10-CM | POA: Insufficient documentation

## 2020-09-28 DIAGNOSIS — Z79899 Other long term (current) drug therapy: Secondary | ICD-10-CM | POA: Insufficient documentation

## 2020-09-28 DIAGNOSIS — Z87891 Personal history of nicotine dependence: Secondary | ICD-10-CM | POA: Insufficient documentation

## 2020-09-28 DIAGNOSIS — Z794 Long term (current) use of insulin: Secondary | ICD-10-CM | POA: Insufficient documentation

## 2020-09-28 DIAGNOSIS — I456 Pre-excitation syndrome: Secondary | ICD-10-CM | POA: Insufficient documentation

## 2020-09-28 DIAGNOSIS — I110 Hypertensive heart disease with heart failure: Secondary | ICD-10-CM | POA: Insufficient documentation

## 2020-09-28 DIAGNOSIS — I509 Heart failure, unspecified: Secondary | ICD-10-CM | POA: Diagnosis not present

## 2020-09-28 DIAGNOSIS — I10 Essential (primary) hypertension: Secondary | ICD-10-CM

## 2020-09-28 DIAGNOSIS — F4321 Adjustment disorder with depressed mood: Secondary | ICD-10-CM | POA: Insufficient documentation

## 2020-09-28 LAB — CBC
HCT: 40.5 % (ref 39.0–52.0)
Hemoglobin: 13.5 g/dL (ref 13.0–17.0)
MCH: 29.4 pg (ref 26.0–34.0)
MCHC: 33.3 g/dL (ref 30.0–36.0)
MCV: 88.2 fL (ref 80.0–100.0)
Platelets: 377 10*3/uL (ref 150–400)
RBC: 4.59 MIL/uL (ref 4.22–5.81)
RDW: 12.8 % (ref 11.5–15.5)
WBC: 7.4 10*3/uL (ref 4.0–10.5)
nRBC: 0 % (ref 0.0–0.2)

## 2020-09-28 LAB — BASIC METABOLIC PANEL
Anion gap: 10 (ref 5–15)
BUN: 15 mg/dL (ref 6–20)
CO2: 27 mmol/L (ref 22–32)
Calcium: 9.5 mg/dL (ref 8.9–10.3)
Chloride: 100 mmol/L (ref 98–111)
Creatinine, Ser: 0.77 mg/dL (ref 0.61–1.24)
GFR, Estimated: >60 mL/min (ref >60)
Glucose, Bld: 202 mg/dL — ABNORMAL HIGH (ref 70–99)
Potassium: 3.7 mmol/L (ref 3.5–5.1)
Sodium: 137 mmol/L (ref 135–145)

## 2020-09-28 NOTE — Telephone Encounter (Signed)
[facility] - Client TELEPHONE ADVICE RECORD [facility] Patient Name: Alvin Phillips [facility] Gender: Male DOB: [birth year] Age: 40 Y 5 M 4 D Return Phone Number: [phone] (Primary) Address: City/ State/ Zip: [place]  [address] Client [facility] - Client Client Site [facility] - Day Physician Eustaquio Boyden - MD Contact Type Call Who Is Calling Patient / Member / Family / Caregiver Call Type Triage / Clinical Relationship To Patient Self Return Phone Number Please choose phone number Chief Complaint BLOOD PRESSURE HIGH - Systolic (top number) 200 or greater Reason for Call Symptomatic / Request for Health Information Initial Comment Caller states 220/137 bp GOTO Facility Not Listed [facility] Translation No Nurse Assessment Nurse: Kizzie Bane, RN, Marylene Land Date/Time [name] Time): 09/28/2020 2:36:09 PM Confirm and document reason for call. If symptomatic, describe symptoms. ---Caller states his bp is 220/137. He c/o pain with a toothache and was seen by the dentist but they couldn't work on him d/t the blood pressure Does the patient have any new or worsening symptoms? ---Yes Will a triage be completed? ---Yes Related visit to physician within the last 2 weeks? ---No Does the PT have any chronic conditions? (i.e. diabetes, asthma, this includes High risk factors for pregnancy, etc.) ---Yes List chronic conditions. ---diabetes, chf Is this a behavioral health or substance abuse call? ---No Guidelines Guideline Title Affirmed Question Affirmed Notes Nurse Date/Time (Eastern Time) Blood Pressure - High [1] Systolic BP >= 200 OR Diastolic >= 120 AND [2] having NO cardiac or neurologic symptoms Leanord Hawking 09/28/2020 2:38:15 PM Disp. Time [name] Time) Disposition Final User 09/28/2020 2:34:08 PM Send to Urgent Macon Large, Baird Lyons PLEASE NOTE: All timestamps contained  within this report are represented as [place] Standard Time. CONFIDENTIALTY NOTICE: This fax transmission is intended only for the addressee. It contains information that is legally privileged, confidential or otherwise protected from use or disclosure. If you are not the intended recipient, you are strictly prohibited from reviewing, disclosing, copying using or disseminating any of this information or taking any action in reliance on or regarding this information. If you have received this fax in error, please notify us immediately by telephone so that we can arrange for its return to [place]. Phone: [phone], Toll-Free: [phone], Fax: [phone] Page: 2 of 2 Call Id: [id number] 09/28/2020 2:43:11 PM See HCP within 4 Hours (or PCP triage) Yes Kizzie Bane, RN, Rosalyn Charters Disagree/Comply Comply Caller Understands Yes PreDisposition Did not know what to do Care Advice Given Per Guideline SEE HCP (OR PCP TRIAGE) WITHIN 4 HOURS: CALL BACK IF: * Weakness or numbness of the face, arm or leg on one side of the body occurs * Difficulty walking, difficulty talking, or severe headache occurs * Chest pain or difficulty breathing occurs * You become worse CARE ADVICE given per High Blood Pressure (Adult) guideline. Referrals GO TO FACILITY OTHER - SPECIFY

## 2020-09-28 NOTE — ED Triage Notes (Addendum)
Patient went to the dentist today for a toothache.  Has been taking 800 mg Ibuprofen, but sent to ED due to HTN.  Patient states he has history of HTN and CHF.  Has not been taking medications 'for a few days'.  AAOx3.  Skin warm and dry. NAD  Patient reports recent stress due to loss of Aunt and best friend in the past two weeks.

## 2020-09-28 NOTE — ED Provider Notes (Addendum)
[facility] Emergency Department Provider Note  ____________________________________________  Time seen: Approximately 6:29 PM  I have reviewed the triage vital signs and the nursing notes.   HISTORY  Chief Complaint Hypertension    HPI Alvin Phillips is a 40 y.o. male with a history of diabetes hypertension obesity CHF who comes the ED for evaluation due to elevated blood pressure.  Patient reports that recently he was having a tooth ache in his right lower jaw.  He went to the dentist and after they did x-rays they did a screening blood pressure with a wrist cuff and found it to be elevated and told him he needed to go see his doctor right away.  He called his PCP who was unable to work him and so he came to the emergency room.  He denies any acute symptoms such as headache vision changes weakness paresthesia chest pain shortness of breath abdominal pain or back pain.  Overall feels fine.  Patient reports that he has not been taking his Sherryll Burger lately due to stress and grief at the death of his aunt and his childhood best friend both within the past 2 weeks.    Past Medical History:  Diagnosis Date   CHF (congestive heart failure) (HCC)    Diabetes mellitus without complication (HCC) 07/10/13   referred to DSME, did not show (04/2014)   GERD (gastroesophageal reflux disease)    HFrEF (heart failure with reduced ejection fraction) (HCC)    a. 11/2017 Echo: EF 20-25%, diff HK;  b. 02/2018 Echo: EF 30-35%, diff HK   Hypertension    Marijuana abuse    Morbidly obese (HCC) 03/24/2014   NICM (nonischemic cardiomyopathy) (HCC)    a. 11/2017 Echo: EF 20-25%, diff HK, mild MR, mildy dil LA, PASP ; b. 11/2017 Cath: nl cors. PCWP . CO/CI 4.8/1.79; c. 02/2018 Echo: EF 30-35%, diff HK, mildly dil LA, nl RV fxn. PASP .   OSA (obstructive sleep apnea) 10/29/2013   Tobacco abuse    Wolff-Parkinson-White (WPW) syndrome    a. 11/2017 Admitted w/ SVT @ 190 bpm;  b. 12/2017 EPS w/RFCA: right mid septal accessory pathway <1cm from AV node. Ablation performed but developed recurrent WPW post-procedure and placed on Sotalol.     Patient Active Problem List   Diagnosis Date Noted   Right flank pain 05/20/2020   Hyperlipidemia associated with type 2 diabetes mellitus (HCC) 02/12/2020   Uncontrolled type 2 diabetes mellitus with microalbuminuria (HCC) 02/12/2020   Nonadherence to medical treatment 02/10/2020   Puncture wound 02/05/2020   Boil of lower extremity 10/08/2019   Carbuncle of scrotum 03/27/2019   Balanitis 03/27/2019   Hidradenitis suppurativa 03/27/2019   Erectile dysfunction 01/30/2019   Genital warts 01/30/2019   Nonischemic cardiomyopathy (HCC) 12/28/2017   HFrEF (heart failure with reduced ejection fraction) (HCC) 12/21/2017   Allergic dermatitis 10/26/2017   WPW (Wolff-Parkinson-White syndrome) 05/13/2016   Obesity, morbid, BMI 40.0-49.9 (HCC) 03/24/2014   GERD (gastroesophageal reflux disease) 03/24/2014   Essential hypertension 03/24/2014   OSA (obstructive sleep apnea) 10/29/2013   Plantar fasciitis, bilateral 10/29/2013   Diabetes mellitus type 2, uncontrolled, with complications (HCC) 08/27/2013   Microalbuminuria 08/27/2013   Microscopic hematuria 07/22/2013     Past Surgical History:  Procedure Laterality Date   FRACTURE SURGERY Left 2002   fractured arm   HERNIA REPAIR  12-16-13   umbilical   PVC ABLATION N/A 12/22/2017   Procedure: WPW  ABLATION;  Surgeon: Marinus Maw, MD;  Location: [facility] INVASIVE CV LAB;  Service: Cardiovascular;  Laterality: N/A;   RIGHT/LEFT HEART CATH AND CORONARY ANGIOGRAPHY N/A 12/18/2017   Procedure: RIGHT/LEFT HEART CATH AND CORONARY ANGIOGRAPHY;  Surgeon: Iran Ouch, MD;  Location: [facility] INVASIVE CV LAB;  Service: Cardiovascular;  Laterality: N/A;     Prior to Admission medications   Medication Sig Start Date End Date Taking? Authorizing Provider  Cyanocobalamin (B-12) 1000 MCG  SUBL Place 1 tablet under the tongue daily. Patient not taking: Reported on 05/20/2020 02/12/20   Eustaquio Boyden, MD  furosemide (LASIX) 40 MG tablet Take 40 mg by mouth as needed.    [provider]  glucose blood (BAYER CONTOUR NEXT TEST) test strip Use as instructed Patient not taking: No sig reported 10/24/16   Eustaquio Boyden, MD  Lancets [facility] ULTRASOFT) lancets Use as instructed Patient not taking: No sig reported 10/24/16   Eustaquio Boyden, MD  metoprolol succinate (TOPROL-XL) 50 MG 24 hr tablet Take 1 tablet (50 mg total) by mouth daily. 05/20/20   Eustaquio Boyden, MD  Multiple Vitamin (MULTIVITAMIN ADULT PO) Take by mouth.    [provider]  pantoprazole (PROTONIX) 20 MG tablet TAKE 1 TABLET BY MOUTH ONCE DAILY AS NEEDED FOR  HEARTBURN  OR  INDIGESTION Patient not taking: Reported on 05/20/2020 03/30/20   Eustaquio Boyden, MD  sacubitril-valsartan (ENTRESTO) 24-26 MG Take 1 tablet by mouth 2 (two) times daily. 03/25/20   End, Cristal Deer, MD  Semaglutide, 1 MG/DOSE, (OZEMPIC, 1 MG/DOSE,) 2 MG/1.5ML SOPN Inject 1 mg into the skin once a week. 05/20/20   Eustaquio Boyden, MD  sildenafil (VIAGRA) 50 MG tablet TAKE 1 TABLET BY MOUTH ONCE DAILY AS NEEDED FOR ERECTILE DYSFUNCTION 03/10/20   Eustaquio Boyden, MD     Allergies Metformin and related and Sulfa antibiotics   Family History  Problem Relation Age of Onset   Diabetes Maternal Grandmother    Heart attack Maternal Grandmother 36   Diabetes Paternal Grandfather     Social History Social History   Tobacco Use   Smoking status: Former    Packs/day: 0.25    Years: 15.00    Pack years: 3.75    Types: Cigarettes    Quit date: 2019    Years since quitting: 3.5   Smokeless tobacco: Never  Vaping Use   Vaping Use: Never used  Substance Use Topics   Alcohol use: Yes    Alcohol/week: 0.0 standard drinks    Comment: Occassional beer, shots of liquor   Drug use: Yes    Frequency: 2.0 times per week     Types: Marijuana    Comment: 1 time per week; none in 4-6 months.    Review of Systems  Constitutional:   No fever or chills.  ENT:   No sore throat. No rhinorrhea. Cardiovascular:   No chest pain or syncope. Respiratory:   No dyspnea or cough. Gastrointestinal:   Negative for abdominal pain, vomiting and diarrhea.  Musculoskeletal:   Negative for focal pain or swelling All other systems reviewed and are negative except as documented above in ROS and HPI.  ____________________________________________   PHYSICAL EXAM:  VITAL SIGNS: ED Triage Vitals  Enc Vitals Group     BP 09/28/20 1520 (!) 190/132     Pulse Rate 09/28/20 1520 84     Resp 09/28/20 1520 18     Temp 09/28/20 1520 98.1 F (36.7 C)     Temp Source 09/28/20 1520 Oral     SpO2 09/28/20 1520  99 %     Weight 09/28/20 1521 (!) 365 lb (165.6 kg)     Height 09/28/20 1521 5\' 10"  (1.778 m)     Head Circumference --      Peak Flow --      Pain Score 09/28/20 1521 0     Pain Loc --      Pain Edu? --      Excl. in GC? --     Vital signs reviewed, nursing assessments reviewed.   Constitutional:   Alert and oriented. Non-toxic appearance. Eyes:   Conjunctivae are normal. EOMI. no nystagmus ENT      Head:   Normocephalic and atraumatic.      Mouth/Throat:   MMM      Neck:   No meningismus. Full ROM. Hematological/Lymphatic/Immunilogical:   No cervical lymphadenopathy. Cardiovascular:   RRR. Symmetric bilateral radial and DP pulses.  No murmurs. Cap refill less than 2 seconds. Respiratory:   Normal respiratory effort without tachypnea/retractions. Breath sounds are clear and equal bilaterally. No wheezes/rales/rhonchi. Musculoskeletal:   Normal range of motion in all extremities.  No edema. Neurologic:   Normal speech and language.  Motor grossly intact. No acute focal neurologic deficits are appreciated.  Skin:    Skin is warm, dry and intact. No rash noted.  No wounds.   ____________________________________________    LABS (pertinent positives/negatives) (all labs ordered are listed, but only abnormal results are displayed) Labs Reviewed  BASIC METABOLIC PANEL - Abnormal; Notable for the following components:      Result Value   Glucose, Bld 202 (*)    All other components within normal limits  CBC   ____________________________________________   EKG Interpreted by me Normal sinus rhythm rate of 91, normal axis and intervals.  There is a nonspecific interventricular conduction delay with associated T wave inversions in the inferior leads.  No ST changes.  Not consistent with acute ischemia. Unchanged compared to prior EKG on March 25, 2020. ____________________________________________    RADIOLOGY  DG Chest 2 View  Result Date: 09/28/2020 CLINICAL DATA:  Hypertension EXAM: CHEST - 2 VIEW COMPARISON:  12/14/2017 FINDINGS: Heart and mediastinal contours are within normal limits. No focal opacities or effusions. No acute bony abnormality. IMPRESSION: No active cardiopulmonary disease. Electronically Signed   By: 12/16/2017 M.D.   On: 09/28/2020 16:25    ____________________________________________   PROCEDURES Procedures  ____________________________________________  CLINICAL IMPRESSION / ASSESSMENT AND PLAN / ED COURSE  Pertinent labs & imaging results that were available during my care of the patient were reviewed by me and considered in my medical decision making (see chart for details).  Alvin Phillips was evaluated in Emergency Department on 09/28/2020 for the symptoms described in the history of present illness. He was evaluated in the context of the global COVID-19 pandemic, which necessitated consideration that the patient might be at risk for infection with the SARS-CoV-2 virus that causes COVID-19. Institutional protocols and algorithms that pertain to the evaluation of patients at risk for COVID-19 are in a state of rapid change  based on information released by regulatory bodies including the CDC and federal and state organizations. These policies and algorithms were followed during the patient's care in the ED. Patient presents for evaluation due to uncontrolled hypertension due to medication noncompliance.  Denies any acute symptoms.  Blood pressure at last check is 170/105.  Patient is agreeable to restarting his medication and following up with his doctor.  Labs and chest x-ray and exam are  all reassuring, doubt ACS PE dissection stroke or other acute pathology.  Stable for discharge       ____________________________________________   FINAL CLINICAL IMPRESSION(S) / ED DIAGNOSES    Final diagnoses:  Hypertension, unspecified type  Type 2 diabetes mellitus without complication, unspecified whether long term insulin use (HCC)  Grief     ED Discharge Orders     None       Portions of this note were generated with dragon dictation software. Dictation errors may occur despite best attempts at proofreading.   Sharman Cheek, MD 09/28/20 Berna Spare    Sharman Cheek, MD 09/28/20 [phone]

## 2020-09-28 NOTE — Telephone Encounter (Signed)
Will await ER eval. H/o non-adherence to medical regimen.

## 2020-10-05 ENCOUNTER — Ambulatory Visit (INDEPENDENT_AMBULATORY_CARE_PROVIDER_SITE_OTHER): Payer: 59 | Admitting: Family Medicine

## 2020-10-05 ENCOUNTER — Other Ambulatory Visit: Payer: Self-pay

## 2020-10-05 ENCOUNTER — Encounter: Payer: Self-pay | Admitting: Family Medicine

## 2020-10-05 VITALS — BP 142/100 | HR 83 | Temp 97.5°F | Ht 70.0 in | Wt 350.4 lb

## 2020-10-05 DIAGNOSIS — I5020 Unspecified systolic (congestive) heart failure: Secondary | ICD-10-CM | POA: Diagnosis not present

## 2020-10-05 DIAGNOSIS — E1165 Type 2 diabetes mellitus with hyperglycemia: Secondary | ICD-10-CM

## 2020-10-05 DIAGNOSIS — I428 Other cardiomyopathies: Secondary | ICD-10-CM

## 2020-10-05 DIAGNOSIS — I10 Essential (primary) hypertension: Secondary | ICD-10-CM | POA: Diagnosis not present

## 2020-10-05 DIAGNOSIS — E118 Type 2 diabetes mellitus with unspecified complications: Secondary | ICD-10-CM | POA: Diagnosis not present

## 2020-10-05 DIAGNOSIS — E1129 Type 2 diabetes mellitus with other diabetic kidney complication: Secondary | ICD-10-CM

## 2020-10-05 DIAGNOSIS — N529 Male erectile dysfunction, unspecified: Secondary | ICD-10-CM

## 2020-10-05 DIAGNOSIS — IMO0002 Reserved for concepts with insufficient information to code with codable children: Secondary | ICD-10-CM

## 2020-10-05 DIAGNOSIS — R809 Proteinuria, unspecified: Secondary | ICD-10-CM

## 2020-10-05 DIAGNOSIS — Z6841 Body Mass Index (BMI) 40.0 and over, adult: Secondary | ICD-10-CM

## 2020-10-05 LAB — POCT GLYCOSYLATED HEMOGLOBIN (HGB A1C): Hemoglobin A1C: 9.6 % — AB (ref 4.0–5.6)

## 2020-10-05 MED ORDER — ENTRESTO 24-26 MG PO TABS: 1.0000 | ORAL_TABLET | Freq: Two times a day (BID) | ORAL | 1 refills | Status: DC

## 2020-10-05 MED ORDER — GLIMEPIRIDE 1 MG PO TABS: 1.0000 mg | ORAL_TABLET | Freq: Every day | ORAL | 11 refills | Status: DC

## 2020-10-05 MED ORDER — SILDENAFIL CITRATE 50 MG PO TABS: 50.0000 mg | ORAL_TABLET | Freq: Every day | ORAL | 6 refills | Status: DC | PRN

## 2020-10-05 NOTE — Assessment & Plan Note (Signed)
Down to 4 pills of entresto - I have refilled, advised to take BID along with his metoprolol, advised he call Dr Serita Kyle office for f/u visit as overdue.

## 2020-10-05 NOTE — Assessment & Plan Note (Signed)
Chronic, deteriorated. Attributes to dietary liberties and occasional missed ozempic doses. Will add glimepiride 1mg  daily with breakfast, discussed importance of always taking with meal to avoid hypoglycemia. Advised monitor for itchy rash in h/o sulfa allergy.

## 2020-10-05 NOTE — Patient Instructions (Addendum)
I've refilled entresto 1 month supply for you while you get in to see cardiology - take twice daily. Call to schedule cardiology follow up with Dr End as you're overdue.  Start glimepiride 1mg  daily with breakfast.  Continue ozempic which I have refilled.  Schedule eye exam as you're due.  Return in 3 months for physical.

## 2020-10-05 NOTE — Progress Notes (Signed)
Patient ID: Alvin Phillips, male    DOB: 08/10/1980, 40 y.o.   MRN: [id number]  This visit was conducted in person.  BP (!) 142/100   Pulse 83   Temp (!) 97.5 F (36.4 C) (Temporal)   Ht 5\' 10"  (1.778 m)   Wt (!) 350 lb 7 oz (159 kg)   SpO2 96%   BMI 50.28 kg/m   BP Readings from Last 3 Encounters:  10/05/20 (!) 142/100  09/28/20 (!) 169/105  05/20/20 (!) 156/100  BP 160/108 on retesting  CC: ER f/u visit  Subjective:   HPI: Alvin Phillips is a 40 y.o. male presenting on 10/05/2020 for Hospitalization Follow-up (Seen on 09/28/20 at [facility] ED, dx HTN; DM II. )   Recent [facility] ER visit for hypertensive urgency noted at dentist office. Records reviewed. BP at ER 190/132. CXR normal, labs showed hyperglycemia. EKG with nonspecific conduction delay and inferior T wave inversions. Patient was largely asymptomatic. Had been off Entresto after 2 recent family/friend deaths.   Pending dental extraction.   Last saw cardiology 03/2020, due for f/u. Known WPW s/p ablation and chronic HFrEF due to niCM. Med changes at that time included entresto 24/26 bid and metoprolol succinate 25mg  daily. He's only been taken entresto once daily, has been taking metoprolol daily for the past week.   DM - does not regularly check sugars. Compliant with antihyperglycemic regimen which includes: ozempic 1mg  weekly. Denies low sugars or hypoglycemic symptoms. Denies paresthesias. Last diabetic eye exam DUE. Pneumovax: DUE. Prevnar: DUE. Glucometer brand: bayer contour next has meter and test strips at home. DSME: never completed.  Lab Results  Component Value Date   HGBA1C 9.6 (A) 10/05/2020   Diabetic Foot Exam - Simple   Simple Foot Form Diabetic Foot exam was performed with the following findings: Yes 10/05/2020 11:05 AM  Visual Inspection See comments: Yes Sensation Testing Intact to touch and monofilament testing bilaterally: Yes Pulse Check Posterior Tibialis and Dorsalis pulse intact bilaterally:  Yes Comments Dry skin to soles    Lab Results  Component Value Date   MICROALBUR 70.6 (H) 02/10/2020        Relevant past medical, surgical, family and social history reviewed and updated as indicated. Interim medical history since our last visit reviewed. Allergies and medications reviewed and updated. Outpatient Medications Prior to Visit  Medication Sig Dispense Refill   Acetaminophen (TYLENOL PO) Take by mouth as needed.     amoxicillin (AMOXIL) 500 MG capsule Take 500 mg by mouth every 8 (eight) hours.     furosemide (LASIX) 40 MG tablet Take 40 mg by mouth as needed.     metoprolol succinate (TOPROL-XL) 50 MG 24 hr tablet Take 1 tablet (50 mg total) by mouth daily. 30 tablet 6   Multiple Vitamin (MULTIVITAMIN ADULT PO) Take by mouth.     pantoprazole (PROTONIX) 20 MG tablet TAKE 1 TABLET BY MOUTH ONCE DAILY AS NEEDED FOR  HEARTBURN  OR  INDIGESTION 30 tablet 5   Semaglutide, 1 MG/DOSE, (OZEMPIC, 1 MG/DOSE,) 2 MG/1.5ML SOPN Inject 1 mg into the skin once a week. 1.5 mL 11   sacubitril-valsartan (ENTRESTO) 24-26 MG Take 1 tablet by mouth 2 (two) times daily. 60 tablet 3   sildenafil (VIAGRA) 50 MG tablet TAKE 1 TABLET BY MOUTH ONCE DAILY AS NEEDED FOR ERECTILE DYSFUNCTION 6 tablet 0   Cyanocobalamin (B-12) 1000 MCG SUBL Place 1 tablet under the tongue daily. (Patient not taking: No sig reported)  glucose blood (BAYER CONTOUR NEXT TEST) test strip Use as instructed (Patient not taking: No sig reported) 100 each 5   Lancets (ONETOUCH ULTRASOFT) lancets Use as instructed (Patient not taking: No sig reported) 100 each 5   No facility-administered medications prior to visit.     Per HPI unless specifically indicated in ROS section below Review of Systems  Objective:  BP (!) 142/100   Pulse 83   Temp (!) 97.5 F (36.4 C) (Temporal)   Ht 5\' 10"  (1.778 m)   Wt (!) 350 lb 7 oz (159 kg)   SpO2 96%   BMI 50.28 kg/m   Wt Readings from Last 3 Encounters:  10/05/20 (!) 350 lb 7  oz (159 kg)  09/28/20 (!) 365 lb (165.6 kg)  05/20/20 (!) 341 lb (154.7 kg)      Physical Exam Vitals and nursing note reviewed.  Constitutional:      Appearance: Normal appearance. He is obese. He is not ill-appearing.  Eyes:     Extraocular Movements: Extraocular movements intact.     Conjunctiva/sclera: Conjunctivae normal.     Pupils: Pupils are equal, round, and reactive to light.  Cardiovascular:     Rate and Rhythm: Normal rate and regular rhythm.     Pulses: Normal pulses.     Heart sounds: Normal heart sounds. No murmur heard. Pulmonary:     Effort: Pulmonary effort is normal. No respiratory distress.     Breath sounds: Normal breath sounds. No wheezing, rhonchi or rales.  Musculoskeletal:     Right lower leg: No edema.     Left lower leg: No edema.     Comments: See HPI for foot exam if done  Skin:    General: Skin is warm and dry.     Findings: No rash.  Neurological:     Mental Status: He is alert.  Psychiatric:        Mood and Affect: Mood normal.        Behavior: Behavior normal.      Results for orders placed or performed in visit on 10/05/20  POCT glycosylated hemoglobin (Hb A1C)  Result Value Ref Range   Hemoglobin A1C 9.6 (A) 4.0 - 5.6 %   HbA1c POC (<> result, manual entry)     HbA1c, POC (prediabetic range)     HbA1c, POC (controlled diabetic range)      Assessment & Plan:  This visit occurred during the SARS-CoV-2 public health emergency.  Safety protocols were in place, including screening questions prior to the visit, additional usage of staff PPE, and extensive cleaning of exam room while observing appropriate contact time as indicated for disinfecting solutions.   Problem List Items Addressed This Visit     Diabetes mellitus type 2, uncontrolled, with complications (HCC) - Primary    Chronic, deteriorated. Attributes to dietary liberties and occasional missed ozempic doses. Will add glimepiride 1mg  daily with breakfast, discussed importance of  always taking with meal to avoid hypoglycemia. Advised monitor for itchy rash in h/o sulfa allergy.         Relevant Medications   glimepiride (AMARYL) 1 MG tablet   Other Relevant Orders   POCT glycosylated hemoglobin (Hb A1C) (Completed)   Morbid obesity with BMI of 50.0-59.9, adult (HCC)   Relevant Medications   glimepiride (AMARYL) 1 MG tablet   Essential hypertension    Chronic, remaining elevated however he's only been taking entresto once daily - advised increase to BID dosing, update with effect.  Relevant Medications   sildenafil (VIAGRA) 50 MG tablet   sacubitril-valsartan (ENTRESTO) 24-26 MG   HFrEF (heart failure with reduced ejection fraction) (HCC)    Down to 4 pills of entresto - I have refilled, advised to take BID along with his metoprolol, advised he call Dr Serita Kyle office for f/u visit as overdue.         Relevant Medications   sildenafil (VIAGRA) 50 MG tablet   sacubitril-valsartan (ENTRESTO) 24-26 MG   Nonischemic cardiomyopathy (HCC)   Relevant Medications   sildenafil (VIAGRA) 50 MG tablet   sacubitril-valsartan (ENTRESTO) 24-26 MG   Erectile dysfunction   Relevant Medications   sildenafil (VIAGRA) 50 MG tablet   Uncontrolled type 2 diabetes mellitus with microalbuminuria (HCC)   Relevant Medications   glimepiride (AMARYL) 1 MG tablet     Meds ordered this encounter  Medications   sildenafil (VIAGRA) 50 MG tablet    Sig: Take 1 tablet (50 mg total) by mouth daily as needed for erectile dysfunction.    Dispense:  6 tablet    Refill:  6   sacubitril-valsartan (ENTRESTO) 24-26 MG    Sig: Take 1 tablet by mouth 2 (two) times daily.    Dispense:  60 tablet    Refill:  1   glimepiride (AMARYL) 1 MG tablet    Sig: Take 1 tablet (1 mg total) by mouth daily with breakfast.    Dispense:  30 tablet    Refill:  11   Orders Placed This Encounter  Procedures   POCT glycosylated hemoglobin (Hb A1C)     Patient Instructions  I've refilled  entresto 1 month supply for you while you get in to see cardiology - take twice daily. Call to schedule cardiology follow up with Dr End as you're overdue.  Start glimepiride 1mg  daily with breakfast.  Continue ozempic which I have refilled.  Schedule eye exam as you're due.  Return in 3 months for physical.   Follow up plan: Return in about 3 months (around 01/05/2021) for follow up visit.  01/07/2021, MD

## 2020-10-05 NOTE — Assessment & Plan Note (Signed)
Chronic, remaining elevated however he's only been taking entresto once daily - advised increase to BID dosing, update [place] with effect.

## 2020-10-09 ENCOUNTER — Other Ambulatory Visit: Payer: Self-pay

## 2020-10-09 ENCOUNTER — Encounter: Payer: Self-pay | Admitting: Internal Medicine

## 2020-10-09 ENCOUNTER — Ambulatory Visit: Payer: 59 | Admitting: Family Medicine

## 2020-10-09 ENCOUNTER — Ambulatory Visit (INDEPENDENT_AMBULATORY_CARE_PROVIDER_SITE_OTHER): Payer: 59 | Admitting: Internal Medicine

## 2020-10-09 ENCOUNTER — Telehealth: Payer: Self-pay | Admitting: *Deleted

## 2020-10-09 VITALS — BP 160/100 | HR 72 | Ht 70.0 in | Wt 348.0 lb

## 2020-10-09 DIAGNOSIS — I428 Other cardiomyopathies: Secondary | ICD-10-CM

## 2020-10-09 DIAGNOSIS — I10 Essential (primary) hypertension: Secondary | ICD-10-CM

## 2020-10-09 DIAGNOSIS — E1159 Type 2 diabetes mellitus with other circulatory complications: Secondary | ICD-10-CM

## 2020-10-09 DIAGNOSIS — I5022 Chronic systolic (congestive) heart failure: Secondary | ICD-10-CM | POA: Diagnosis not present

## 2020-10-09 DIAGNOSIS — I152 Hypertension secondary to endocrine disorders: Secondary | ICD-10-CM

## 2020-10-09 DIAGNOSIS — I456 Pre-excitation syndrome: Secondary | ICD-10-CM

## 2020-10-09 DIAGNOSIS — I5020 Unspecified systolic (congestive) heart failure: Secondary | ICD-10-CM

## 2020-10-09 MED ORDER — ENTRESTO 49-51 MG PO TABS: 1.0000 | ORAL_TABLET | Freq: Two times a day (BID) | ORAL | 3 refills | Status: AC

## 2020-10-09 NOTE — Progress Notes (Signed)
Follow-up Outpatient Visit Date: 10/09/2020  Primary Care Provider: Eustaquio Boyden, MD [address] [place] [address]  Chief Complaint: Follow-up heart failure  HPI:  Alvin Phillips is a 40 y.o. male with history of Wolff-Parkinson-White syndrome, chronic HFrEF due to nonischemic cardiomyopathy, palpitations, hypertension, diabetes mellitus, morbid obesity, GERD, and sleep apnea, who presents for follow-up of HFrEF and WPW.  I last saw him in January, at which time Alvin Phillips was doing fairly well.  He reported only taking his medications intermittently over the last 6 months.  He had run out of Entresto and sotalol.  He also reported a few "jitters" in his chest.  We restarted Entresto and metoprolol succinate but agreed to hold sotalol pending EP evaluation.  He was seen later the same day by Dr. Ladona Ridgel, who agreed with reinitiation of metoprolol.  Today, Alvin Phillips complains only of a toothache for which he has been using acetaminophen and ibuprofen with temporary relief.  He is also on amoxicillin prescribed by his dentist, with plans for tooth extraction next week (in 3 days).  He otherwise has been feeling relatively well, though he reports feeling "tired" after doing strenuous activities.  He denies shortness of breath, chest pain, and palpitations.  He has minimal swelling of his ankles at times.  He is compliant with his medications and is tolerating them without side effects.  He notes having developed a rash on his penis while on Jardiance in the past.  --------------------------------------------------------------------------------------------------  Cardiovascular History & Procedures: Cardiovascular Problems: Chest pain Shortness of breath WPW   Risk Factors: Hypertension, diabetes mellitus, male gender, and morbid obesity   Cath/PCI: R/LHC (12/18/2017): Normal coronary arteries.  Severely elevated left and right heart filling pressures.  Moderately reduced Fick  cardiac output/index.   CV Surgery: None   EP Procedures and Devices: EPS/ablation (12/22/2017, Dr. 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   Non-Invasive Evaluation(s): TTE (02/22/2018): Moderately dilated LV with mild LVH.  LVEF 30-35% with global hypokinesis.  Mild LAE.  Normal RV size and function.  Mild to moderate pulmonary hypertension. TTE (12/15/2017): Severely dilated LV with mild LVH.  LVEF 20-25% with global hypokinesis.  Mild MR.  Mild LAE.  Mildly reduced RV function.  Mild pulmonary hypertension.  Recent CV Pertinent Labs: Lab Results  Component Value Date   CHOL 163 12/17/2017   HDL 27 (L) 12/17/2017   LDLCALC 93 12/17/2017   LDLDIRECT 109.0 02/10/2020   TRIG 215 (H) 12/17/2017   CHOLHDL 6.0 12/17/2017   INR 0.97 12/17/2017   BNP 234.0 (H) 12/14/2017   K 3.7 09/28/2020   K 3.6 12/10/2013   MG 1.8 12/25/2017   BUN 15 09/28/2020   BUN 16 01/01/2018   BUN 11 12/10/2013   CREATININE 0.77 09/28/2020   CREATININE 0.97 12/10/2013    Past medical and surgical history were reviewed and updated in EPIC.  Current Meds  Medication Sig   Acetaminophen (TYLENOL PO) Take by mouth as needed.   Cyanocobalamin (B-12) 1000 MCG SUBL Place 1 tablet under the tongue daily.   furosemide (LASIX) 40 MG tablet Take 40 mg by mouth as needed.   glimepiride (AMARYL) 1 MG tablet Take 1 tablet (1 mg total) by mouth daily with breakfast.   glucose blood (BAYER CONTOUR NEXT TEST) test strip Use as instructed   Lancets (ONETOUCH ULTRASOFT) lancets Use as instructed   metoprolol succinate (TOPROL-XL) 50 MG 24 hr  tablet Take 1 tablet (50 mg total) by mouth daily.   pantoprazole (PROTONIX) 20 MG tablet TAKE 1 TABLET BY MOUTH ONCE DAILY AS NEEDED FOR  HEARTBURN  OR  INDIGESTION   sacubitril-valsartan (ENTRESTO) 24-26 MG Take 1 tablet by mouth 2 (two) times daily.   Semaglutide, 1 MG/DOSE, (OZEMPIC, 1 MG/DOSE,) 2  MG/1.5ML SOPN Inject 1 mg into the skin once a week.   sildenafil (VIAGRA) 50 MG tablet Take 1 tablet (50 mg total) by mouth daily as needed for erectile dysfunction.    Allergies: Metformin and related and Sulfa antibiotics  Social History   Tobacco Use   Smoking status: Former    Packs/day: 0.25    Years: 15.00    Pack years: 3.75    Types: Cigarettes    Quit date: 2019    Years since quitting: 3.5   Smokeless tobacco: Never  Vaping Use   Vaping Use: Never used  Substance Use Topics   Alcohol use: Yes    Alcohol/week: 0.0 standard drinks    Comment: Occassional beer, shots of liquor   Drug use: Yes    Frequency: 2.0 times per week    Types: Marijuana    Comment: 1 time per week; none in 4-6 months.    Family History  Problem Relation Age of Onset   Diabetes Maternal Grandmother    Heart attack Maternal Grandmother 41   Diabetes Paternal Grandfather     Review of Systems: A 12-system review of systems was performed and was negative except as noted in the HPI.  --------------------------------------------------------------------------------------------------  Physical Exam: BP (!) 160/100 (BP Location: Left Arm, Patient Position: Sitting, Cuff Size: Large)   Pulse 72   Ht 5\' 10"  (1.778 m)   Wt (!) 348 lb (157.9 kg)   SpO2 98%   BMI 49.93 kg/m   General:  NAD. Neck: No JVD or HJR. Lungs: Clear to auscultation bilaterally without wheezes or crackles. Heart: Regular rate and rhythm without murmurs, rubs, or gallops. Abdomen: Soft, nontender, nondistended. Extremities: No lower extremity edema.  EKG:  Normal sinus rhythm with first degree AV block (PR interval 220 ms), borderline LVH, and inferolateral T wave inversions.  Inferolateral T wave inversions are slightly more pronounced today; otherwise no significant interval change.  Lab Results  Component Value Date   WBC 7.4 09/28/2020   HGB 13.5 09/28/2020   HCT 40.5 09/28/2020   MCV 88.2 09/28/2020   PLT  377 09/28/2020    Lab Results  Component Value Date   NA 137 09/28/2020   K 3.7 09/28/2020   CL 100 09/28/2020   CO2 27 09/28/2020   BUN 15 09/28/2020   CREATININE 0.77 09/28/2020   GLUCOSE 202 (H) 09/28/2020   ALT 57 (H) 12/14/2017    Lab Results  Component Value Date   CHOL 163 12/17/2017   HDL 27 (L) 12/17/2017   LDLCALC 93 12/17/2017   LDLDIRECT 109.0 02/10/2020   TRIG 215 (H) 12/17/2017   CHOLHDL 6.0 12/17/2017    --------------------------------------------------------------------------------------------------  ASSESSMENT AND PLAN: Chronic HFrEF due to NICM: Alvin Phillips appears euvolemic with NYHA class II symptoms.  We will increase Entresto to 49/51 mg BID dosing and continue with current dose of metoprolol succinate 50 mg twice daily.  If blood pressure tolerates, further escalation of both agents and potential addition of spironolactone will need to be considered at subsequent visits.  We will defer rechallenging him with an SGLT-2 inhibitor given suspicion for balantitis in the past with this class.  Will need repeat TTE to assess LVEF once GDMT has been optimized and consideration for ICD if LVEF remains < 35%.  We will repeat a BMP in ~2 weeks given escalation of Entresto.  WPW: S/p incomplete ablation by Dr. Ladona Ridgel, previously on sotalol but now only on metoprolol.  No significant palpitations reported since our last visit 6 months ago.  Continue metoprolol succinate 50 mg daily with possible escalation at follow-up visits if HR, BP, and PR interval tolerate.  Continue annual EP follow-up.  Hypertension associated with type 2 diabetes mellitus: BP poorly controlled today.  We will increase Entresto and continue current dose of metoprolol.  Sodium restriction and weight loss encouraged.  Ongoing management of DM per Dr. Sharen Hones.  Morbid obesity: BMI remains > 40.  Weight loss through diet and exercise encouraged.  Follow-up: Return to clinic in 6-8  weeks.  Yvonne Kendall, MD 10/09/2020 9:34 AM

## 2020-10-09 NOTE — Patient Instructions (Signed)
Medication Instructions:   Your physician has recommended you make the following change in your medication:   INCREASE Entresto - Take TWO tablets of current dose (24/26mg ) TWICE daily until completed.  THEN START Entresto 49/51mg  - take ONE tablet TWICE daily - A new Rx has been sent to your pharmacy  *If you need a refill on your cardiac medications before your next appointment, please call your pharmacy*   Lab Work:  -  Your physician recommends that you return for lab work in: 2 WEEKS (BMET) This lab is not fasting -  Please go to the [facility]. You will check in at the front desk to the right as you walk into the atrium. Valet Parking is offered if needed. - No appointment needed. You may go any day between 7 am and 6 pm.   Testing/Procedures:  None ordered   Follow-Up: At [facility], you and your health needs are our priority.  As part of our continuing mission to provide you with exceptional heart care, we have created designated Provider Care Teams.  These Care Teams include your primary Cardiologist (physician) and Advanced Practice Providers (APPs -  Physician Assistants and Nurse Practitioners) who all work together to provide you with the care you need, when you need it.  We recommend signing up for the patient portal called "MyChart".  Sign up information is provided on this After Visit Summary.  MyChart is used to connect with patients for Virtual Visits (Telemedicine).  Patients are able to view lab/test results, encounter notes, upcoming appointments, etc.  Non-urgent messages can be sent to your provider as well.   To learn more about what you can do with MyChart, go to [email or web address].    Your next appointment:   6 - 8 week(s)  The format for your next appointment:   In Person  Provider:   You may see Yvonne Kendall, MD or one of the following Advanced Practice Providers on your designated Care Team:   Nicolasa Ducking, NP Eula Listen,  PA-C Marisue Ivan, PA-C Cadence [place], [place]

## 2020-10-09 NOTE — Telephone Encounter (Signed)
Ok to do - I have ordered this. Thanks.

## 2020-10-09 NOTE — Telephone Encounter (Signed)
Pt seen today by Dr. Okey Dupre and is to have repeat BMET in 2 weeks.  Pt requested to have at his PCP since he is a difficult stick.  Will forward to PCP to verify ok to have done at his office.

## 2020-10-10 ENCOUNTER — Encounter: Payer: Self-pay | Admitting: Internal Medicine

## 2020-10-27 ENCOUNTER — Other Ambulatory Visit: Payer: Self-pay

## 2020-10-27 ENCOUNTER — Other Ambulatory Visit (INDEPENDENT_AMBULATORY_CARE_PROVIDER_SITE_OTHER): Payer: 59

## 2020-10-27 DIAGNOSIS — I5022 Chronic systolic (congestive) heart failure: Secondary | ICD-10-CM

## 2020-10-27 DIAGNOSIS — I10 Essential (primary) hypertension: Secondary | ICD-10-CM | POA: Diagnosis not present

## 2020-10-27 DIAGNOSIS — E1165 Type 2 diabetes mellitus with hyperglycemia: Secondary | ICD-10-CM | POA: Diagnosis not present

## 2020-10-27 DIAGNOSIS — E118 Type 2 diabetes mellitus with unspecified complications: Secondary | ICD-10-CM

## 2020-10-27 DIAGNOSIS — IMO0002 Reserved for concepts with insufficient information to code with codable children: Secondary | ICD-10-CM

## 2020-10-27 LAB — HEPATIC FUNCTION PANEL
ALT: 19 U/L (ref 0–53)
AST: 14 U/L (ref 0–37)
Albumin: 4 g/dL (ref 3.5–5.2)
Alkaline Phosphatase: 52 U/L (ref 39–117)
Bilirubin, Direct: 0.1 mg/dL (ref 0.0–0.3)
Total Bilirubin: 0.3 mg/dL (ref 0.2–1.2)
Total Protein: 7.3 g/dL (ref 6.0–8.3)

## 2020-10-27 NOTE — Addendum Note (Signed)
Addended by: Alvina Chou on: 10/27/2020 09:04 AM   Modules accepted: Orders

## 2020-10-28 LAB — BASIC METABOLIC PANEL
BUN/Creatinine Ratio: 18 (ref 9–20)
BUN: 16 mg/dL (ref 6–24)
CO2: 23 mmol/L (ref 20–29)
Calcium: 9.2 mg/dL (ref 8.7–10.2)
Chloride: 102 mmol/L (ref 96–106)
Creatinine, Ser: 0.89 mg/dL (ref 0.76–1.27)
Glucose: 163 mg/dL — ABNORMAL HIGH (ref 65–99)
Potassium: 4.5 mmol/L (ref 3.5–5.2)
Sodium: 141 mmol/L (ref 134–144)
eGFR: 110 mL/min/{1.73_m2} (ref >59)

## 2020-11-27 ENCOUNTER — Ambulatory Visit: Payer: 59 | Admitting: Medical

## 2020-11-27 NOTE — Progress Notes (Deleted)
Cardiology Office Note:    Date:  11/27/2020   ID:  Alvin Phillips, DOB [birth year], MRN [id number]  PCP:  Alvin Boyden, MD  [facility] Cardiologist:  Alvin Kendall, MD  [facility] Electrophysiologist:  Alvin Bunting, MD   Referring MD: Alvin Boyden, MD   Chief Complaint: 6-8 week follow-up  History of Present Illness:    Alvin Phillips is a 40 y.o. male with a hx of  WPW, chronic HFpEF, NICM, palpitations, HTN, DM2, morbid obesity, GERD, OSA who presents for follow-up.   Echo 11/2017 showed LVEF 20-25%, global HK, mild MR, mild LAE, mildly reduced RV function, mild pulmonary HTN. He had a R/L heart cath 2019 which showed normal coronary arteries, severely elevated left and right heart filling pressures, moderately reduced Fick cardiac output/index. H/o WPW s/p ablation 10/20-19 by Dr. Ladona Phillips placed on sotolol. Echo 02/2018 showed LVEF 30-35%. Sostolol was subsequently discontinued and only metoprolol was continued.   Last seen 10/09/20 and Sherryll Burger was increased. Plan to continue GDMT at follow0up.   Today Plant o increase entresto or metoprolol with addition of spiro, H/o balantitis in the past with SGLT2i  Past Medical History:  Diagnosis Date   CHF (congestive heart failure) (HCC)    Diabetes mellitus without complication (HCC) 07/10/13   referred to DSME, did not show (04/2014)   GERD (gastroesophageal reflux disease)    HFrEF (heart failure with reduced ejection fraction) (HCC)    a. 11/2017 Echo: EF 20-25%, diff HK;  b. 02/2018 Echo: EF 30-35%, diff HK   Hypertension    Marijuana abuse    Morbidly obese (HCC) 03/24/2014   NICM (nonischemic cardiomyopathy) (HCC)    a. 11/2017 Echo: EF 20-25%, diff HK, mild MR, mildy dil LA, PASP ; b. 11/2017 Cath: nl cors. PCWP . CO/CI 4.8/1.79; c. 02/2018 Echo: EF 30-35%, diff HK, mildly dil LA, nl RV fxn. PASP .   OSA (obstructive sleep apnea) 10/29/2013   Tobacco abuse    Wolff-Parkinson-White (WPW) syndrome     a. 11/2017 Admitted w/ SVT @ 190 bpm; b. 12/2017 EPS w/RFCA: right mid septal accessory pathway <1cm from AV node. Ablation performed but developed recurrent WPW post-procedure and placed on Sotalol.    Past Surgical History:  Procedure Laterality Date   CARDIAC CATHETERIZATION     FRACTURE SURGERY Left 03/21/2000   fractured arm   HERNIA REPAIR  12/16/2013   umbilical   PVC ABLATION N/A 12/22/2017   Procedure: WPW  ABLATION;  Surgeon: Alvin Maw, MD;  Location: [facility] INVASIVE CV LAB;  Service: Cardiovascular;  Laterality: N/A;   RIGHT/LEFT HEART CATH AND CORONARY ANGIOGRAPHY N/A 12/18/2017   Procedure: RIGHT/LEFT HEART CATH AND CORONARY ANGIOGRAPHY;  Surgeon: Alvin Ouch, MD;  Location: [facility] INVASIVE CV LAB;  Service: Cardiovascular;  Laterality: N/A;    Current Medications: No outpatient medications have been marked as taking for the 11/27/20 encounter (Appointment) with Alvin Phillips, Alvin Phillips H, PA-C.     Allergies:   Metformin and related and Sulfa antibiotics   Social History   Socioeconomic History   Marital status: Single    Spouse name: Not on file   Number of children: 2   Years of education: Not on file   Highest education level: Not on file  Occupational History   Occupation: Advertising account executive: SELF-EMPLOYED    Comment: [employer]  Tobacco Use   Smoking status: Former    Packs/day: 0.25    Years: 15.00  Pack years: 3.75    Types: Cigarettes    Quit date: 2019    Years since quitting: 3.6   Smokeless tobacco: Never  Vaping Use   Vaping Use: Never used  Substance and Sexual Activity   Alcohol use: Yes    Alcohol/week: 0.0 standard drinks    Comment: Occassional beer, shots of liquor   Drug use: Yes    Frequency: 2.0 times per week    Types: Marijuana    Comment: 1 time per week; none in 4-6 months.   Sexual activity: Not on file  Other Topics Concern   Not on file  Social History Narrative   Lives with girlfriend Alvin Phillips grew up in [place]. He is currently living in [place]. He has a daughter and son Alvin Phillips, Alvin Phillips). He breeds dogs Medical sales representative).   Occ: Teaching laboratory technician rep   Social Determinants of Health   Financial Resource Strain: Not on file  Food Insecurity: Not on file  Transportation Needs: Not on file  Physical Activity: Not on file  Stress: Not on file  Social Connections: Not on file     Family History: The patient's family history includes Diabetes in his maternal grandmother and paternal grandfather; Heart attack (age of onset: 90) in his maternal grandmother.  ROS:   Please see the history of present illness.     All other systems reviewed and are negative.  EKGs/Labs/Other Studies Reviewed:    The following studies were reviewed today:  Echo 02/2018  Study Conclusions   - Left ventricle: The cavity size was moderately dilated. There was    mild concentric hypertrophy. Systolic function was moderately to    severely reduced. The estimated ejection fraction was in the    range of 30% to 35%. Diffuse hypokinesis. Regional wall motion    abnormalities cannot be excluded.  - Left atrium: The atrium was mildly dilated.  - Right ventricle: Systolic function was normal.  - Pulmonary arteries: Systolic pressure was mildly elevated. PA    peak pressure: 44 mm Hg (S).   Cardiac cath 11/2017  1.  Normal coronary arteries. 2.  Severely reduced LV systolic function by echo.  Left ventricular angiography was not performed. 3.  Right heart catheterization showed severely elevated filling pressures with pulmonary capillary wedge pressure of 33 mmHg, moderate to severe pulmonary hypertension and moderately reduced cardiac output at 4.8 with a cardiac index of 1.79.   Recommendations: The patient has nonischemic cardiomyopathy.  He continues to be significantly volume overloaded.  I recommend continuing IV diuresis for at least another day.  I switch losartan to  Entresto.  Echo 11/2017 Study Conclusions   - Left ventricle: The cavity size was severely dilated. There was    mild concentric hypertrophy. Systolic function was severely    reduced. The estimated ejection fraction was in the range of 20%    to 25%. Diffuse hypokinesis. Regional wall motion abnormalities    cannot be excluded.  - Mitral valve: There was mild regurgitation.  - Left atrium: The atrium was mildly dilated.  - Right ventricle: Poorly visualized. Systolic function was mildly    reduced.  - Pulmonary arteries: Systolic pressure was mildly elevated. PA    peak pressure: 36 mm Hg (S).    EKG:  EKG is *** ordered today.  The ekg ordered today demonstrates ***  Recent Labs: 09/28/2020: Hemoglobin 13.5; Platelets 377 10/27/2020: ALT 19; BUN 16; Creatinine, Ser 0.89; Potassium 4.5; Sodium 141  Recent Lipid Panel    Component Value Date/Time   CHOL 163 12/17/2017 0535   TRIG 215 (H) 12/17/2017 0535   HDL 27 (L) 12/17/2017 0535   CHOLHDL 6.0 12/17/2017 0535   VLDL 43 (H) 12/17/2017 0535   LDLCALC 93 12/17/2017 0535   LDLDIRECT 109.0 02/10/2020 1009     Risk Assessment/Calculations:   {Does this patient have ATRIAL FIBRILLATION?:[id number]}   Physical Exam:    VS:  There were no vitals taken for this visit.    Wt Readings from Last 3 Encounters:  10/09/20 (!) 348 lb (157.9 kg)  10/05/20 (!) 350 lb 7 oz (159 kg)  09/28/20 (!) 365 lb (165.6 kg)     GEN: *** Well nourished, well developed in no acute distress HEENT: Normal NECK: No JVD; No carotid bruits LYMPHATICS: No lymphadenopathy CARDIAC: ***RRR, no murmurs, rubs, gallops RESPIRATORY:  Clear to auscultation without rales, wheezing or rhonchi  ABDOMEN: Soft, non-tender, non-distended MUSCULOSKELETAL:  No edema; No deformity  SKIN: Warm and dry NEUROLOGIC:  Alert and oriented x 3 PSYCHIATRIC:  Normal affect   ASSESSMENT:    No diagnosis found. PLAN:    In order of problems listed  above:  HFrEF NICM  WPW S/p ablation  HTN   Disposition: Follow up {follow up:15908} with ***   Shared Decision Making/Informed Consent   {Are you ordering a CV Procedure (e.g. stress test, cath, DCCV, TEE, etc)?   Press F2        :[id number]}    Signed, Xitlalli Newhard Ardelle Lesches  11/27/2020 7:38 AM    [facility]

## 2020-11-30 ENCOUNTER — Encounter: Payer: Self-pay | Admitting: Medical

## 2021-01-05 ENCOUNTER — Encounter: Payer: 59 | Admitting: Family Medicine

## 2021-02-12 ENCOUNTER — Other Ambulatory Visit: Payer: Self-pay | Admitting: Family Medicine

## 2021-03-05 ENCOUNTER — Other Ambulatory Visit: Payer: Self-pay | Admitting: Family Medicine

## 2021-03-05 NOTE — Telephone Encounter (Signed)
Refill request does not match medication list. Script was sent to pharmacy 05/20/20   1.5 ml/11. Tried to call patient to confirm what he is currently using. Unable to reach patient by phone. No answer or answering machine. Will have to try and reach patient again later.

## 2021-03-08 ENCOUNTER — Encounter: Payer: Self-pay | Admitting: Family Medicine

## 2021-03-08 ENCOUNTER — Telehealth: Payer: Self-pay | Admitting: Family Medicine

## 2021-03-08 MED ORDER — OZEMPIC (1 MG/DOSE) 2 MG/1.5ML ~~LOC~~ SOPN
1.0000 mg | PEN_INJECTOR | SUBCUTANEOUS | 11 refills | Status: DC
Start: 2021-03-08 — End: 2021-05-14

## 2021-03-08 NOTE — Telephone Encounter (Signed)
E-scribed refill.    Plz schedule lab and cpe visits. (Per Dr. Reece Agar, pt was due in 12/2020.)

## 2021-03-08 NOTE — Telephone Encounter (Signed)
[facility] Night - Client TELEPHONE ADVICE RECORD AccessNurse Patient Name: Alvin Phillips [facility] Gender: Male DOB: [birth year] Age: 40 Y 10 M 10 D Return Phone Number: [phone] (Primary) Address: City/ State/ Zip: [place]  [address] Client [facility] Night - Client Client Site [facility] - Night Contact Type Call Who Is Calling Patient / Member / Family / Caregiver Call Type Triage / Clinical Relationship To Patient Self Return Phone Number [phone] (Primary) Chief Complaint Blood Sugar High Reason for Call Symptomatic / Request for Health Information Initial Comment Caller states pharmacy needs a new prescription for the Ozempic and is pending provider. caller is out of the medication and has not had it for a month now. sees Dr Haywood Filler pharmacy closes at 9am Translation No Nurse Assessment Nurse: Dolores Frame, RN, Celeste Date/Time (Eastern Time): 03/05/2021 9:20:38 PM Confirm and document reason for call. If symptomatic, describe symptoms. ---Caller states he is out of Ozempic x1 month. Caller states he is not having any new or worsening stx. Does the patient have any new or worsening symptoms? ---Yes Will a triage be completed? ---Yes Related visit to physician within the last 2 weeks? ---No Does the PT have any chronic conditions? (i.e. diabetes, asthma, this includes High risk factors for pregnancy, etc.) ---Yes List chronic conditions. ---dm, htn Is this a behavioral health or substance abuse call? ---No Guidelines Guideline Title Affirmed Question Affirmed Notes Nurse Date/Time (Eastern Time) Diabetes - High Blood Sugar [1] Symptoms of high blood sugar (e.g., frequent urination, weak, weight loss) AND [2] not able to test blood glucose Pitre, RN, Celeste 03/05/2021 9:22:28 PM Disp. Time [name] Time) Disposition Final User 03/05/2021 9:27:33 PM See PCP within 24 Hours  Yes Pitre, RN, Celeste PLEASE NOTE: All timestamps contained within this report are represented as [place] Standard Time. CONFIDENTIALTY NOTICE: This fax transmission is intended only for the addressee. It contains information that is legally privileged, confidential or otherwise protected from use or disclosure. If you are not the intended recipient, you are strictly prohibited from reviewing, disclosing, copying using or disseminating any of this information or taking any action in reliance on or regarding this information. If you have received this fax in error, please notify us immediately by telephone so that we can arrange for its return to [place]. Phone: [phone], Toll-Free: [phone], Fax: [phone] Page: 2 of 2 Call Id: [id number] Caller Disagree/Comply Comply Caller Understands Yes PreDisposition InappropriateToAsk Care Advice Given Per Guideline SEE PCP WITHIN 24 HOURS: * IF OFFICE WILL BE CLOSED: You need to be seen within the next 24 hours. A clinic or an urgent care center is often a good source of care if your doctor's office is closed or you can't get an appointment. TREATMENT - LIQUIDS: * Drink at least one glass (8 oz; 240 ml) of water per hour for the next 4 hours. Reason: Adequate hydration will help lower blood sugar. * Try to drink 6 to 8 glasses of water each day. CALL BACK IF: * Vomiting occurs * Rapid breathing occurs * You become worse CARE ADVICE given per Diabetes - High Blood Sugar (Adult) guideline. Comments User: Silverio Lay, RN Date/Time [name] Time): 03/05/2021 9:24:20 PM caller states he has frequent urination Referrals GO TO FACILITY REFUS

## 2021-03-08 NOTE — Addendum Note (Signed)
Addended by: Nanci Pina on: 03/08/2021 03:18 PM   Modules accepted: Orders

## 2021-03-08 NOTE — Telephone Encounter (Signed)
Pt wanted to know if he needed to come in and have his A1C lab

## 2021-03-08 NOTE — Telephone Encounter (Signed)
Please phone in due to E prescribing error.  

## 2021-03-08 NOTE — Telephone Encounter (Signed)
E-scribed refill.  Pt is aware.

## 2021-03-08 NOTE — Telephone Encounter (Signed)
Noted  

## 2021-03-08 NOTE — Telephone Encounter (Signed)
[facility] [address] pharmacy has not opened yet today; sending note to [place] CMA.

## 2021-03-08 NOTE — Telephone Encounter (Signed)
Refill left on vm at pharmacy, #1.5 mL with 2 additional refills, not 11.

## 2021-03-08 NOTE — Telephone Encounter (Signed)
Pt scheduled a lab/cpe in April 2023

## 2021-03-08 NOTE — Telephone Encounter (Signed)
Pt has just called back and he is only seeking a refill for his OZEMPIC. Says he has been out for a month .  [facility] [address] [place] - [address]

## 2021-05-10 ENCOUNTER — Telehealth: Payer: Self-pay | Admitting: Family Medicine

## 2021-05-10 NOTE — Telephone Encounter (Signed)
Mr. Cameren called in and stated that his new insurance doesn't cover ozempic but it covers victoza and wanted to know about the coverage and possible PA

## 2021-05-14 MED ORDER — VICTOZA 18 MG/3ML ~~LOC~~ SOPN: PEN_INJECTOR | SUBCUTANEOUS | 3 refills | Status: DC

## 2021-05-14 NOTE — Telephone Encounter (Signed)
Sent pt [employer]. Plz notify I've sent in victoza to take daily.  Plz have him keep appt 06/2021.

## 2021-05-14 NOTE — Addendum Note (Signed)
Addended by: Eustaquio Boyden on: 05/14/2021 05:30 PM   Modules accepted: Orders

## 2021-05-14 NOTE — Telephone Encounter (Signed)
Alvin Phillips called in and stated that he has been without his medicine for a month and he is a diabetic and needs the medicine.

## 2021-05-17 NOTE — Telephone Encounter (Signed)
Patient notified as instructed by telephone and verbalized understanding. 

## 2021-05-19 NOTE — Telephone Encounter (Signed)
Patient called in, states [facility] told him he needs an authorization for his medication, patient hasn't had medication in over a month ? ?Please reach out to the patient and advise next steps and if we have received authorization paperwork ? ? ?

## 2021-05-19 NOTE — Telephone Encounter (Signed)
Submitted PA for Victoza 18 mg/3 mL SOPN; key:  [id number].  Decision pending.  ?

## 2021-05-21 NOTE — Telephone Encounter (Signed)
Spoke to [name], notified him that his medication had been authorized and that the pharmacy should be calling when his medication is ready for pick-up. ?For any issues, please call your pharmacy ?

## 2021-05-21 NOTE — Telephone Encounter (Signed)
Noted  

## 2021-05-21 NOTE — Telephone Encounter (Signed)
Wife called to follow up on authorization. Tried to look up in cover my meds but I did not submit so it would not pull up for me.  ?

## 2021-05-21 NOTE — Telephone Encounter (Signed)
Received PA approval via CoverMyMeds, valid 05/19/2021- 05/20/2022. ?

## 2021-06-23 IMAGING — DX RIGHT KNEE - COMPLETE 4+ VIEW
4 series · 4 of 4 positions shown · non-contrast
Comparison: None.

CLINICAL DATA: Acute right knee pain.  Right knee pain for 3 weeks.

EXAM:
RIGHT KNEE - COMPLETE 4+ VIEW

[knee ap]
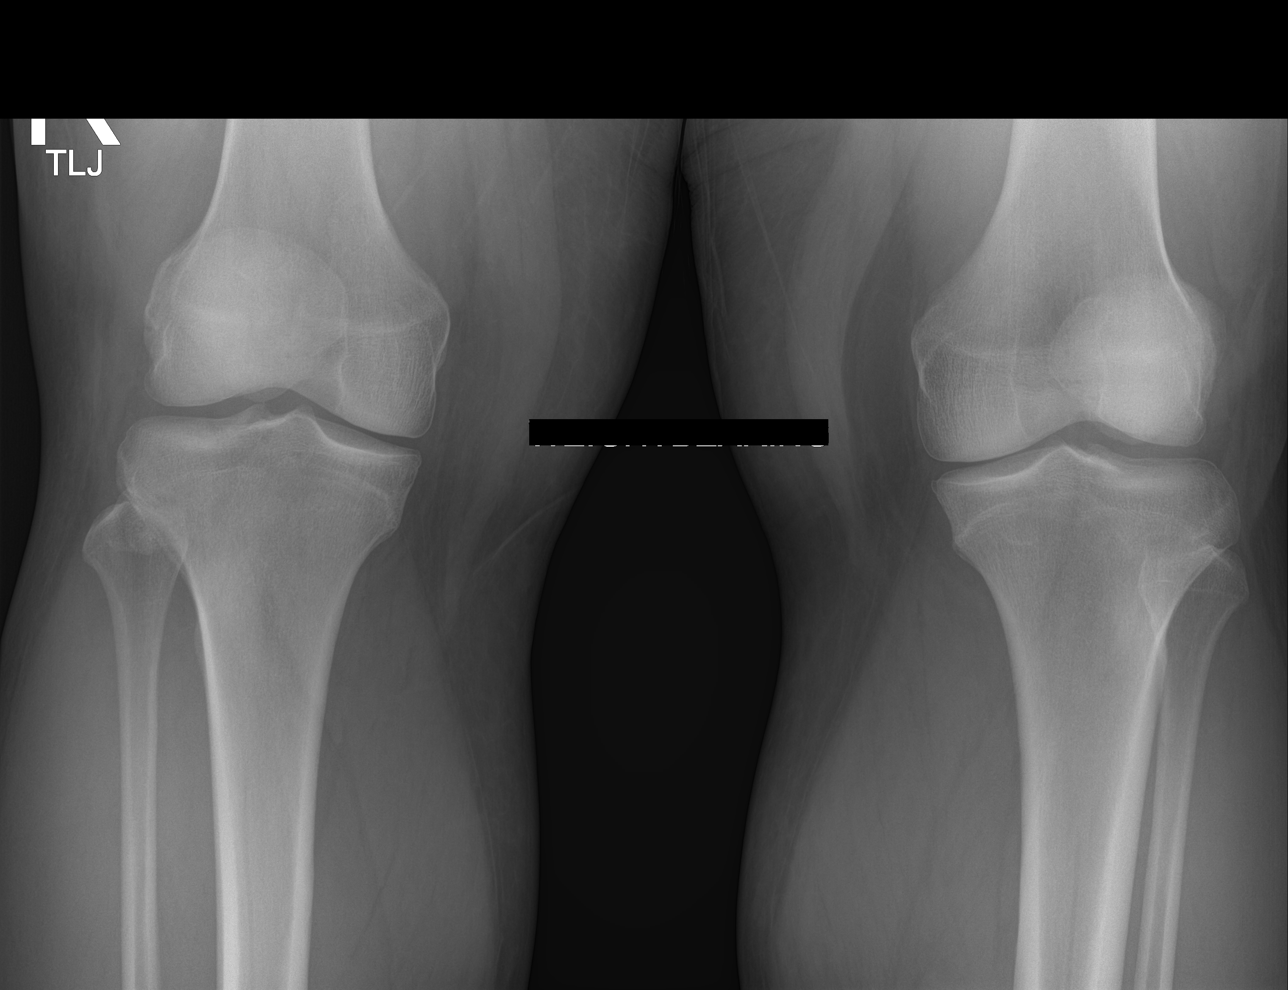

[knee tunnel]
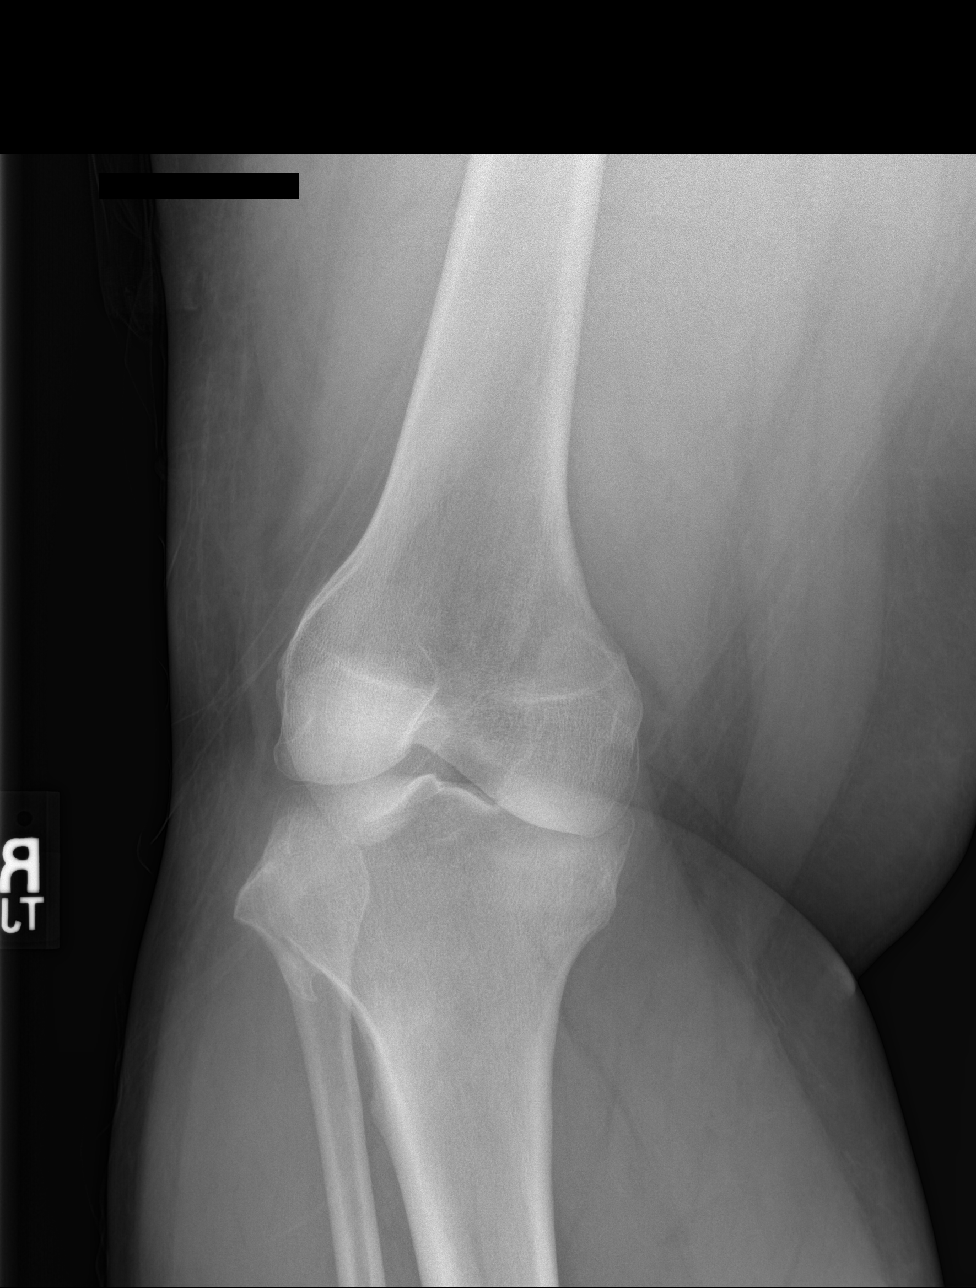

[knee lat]
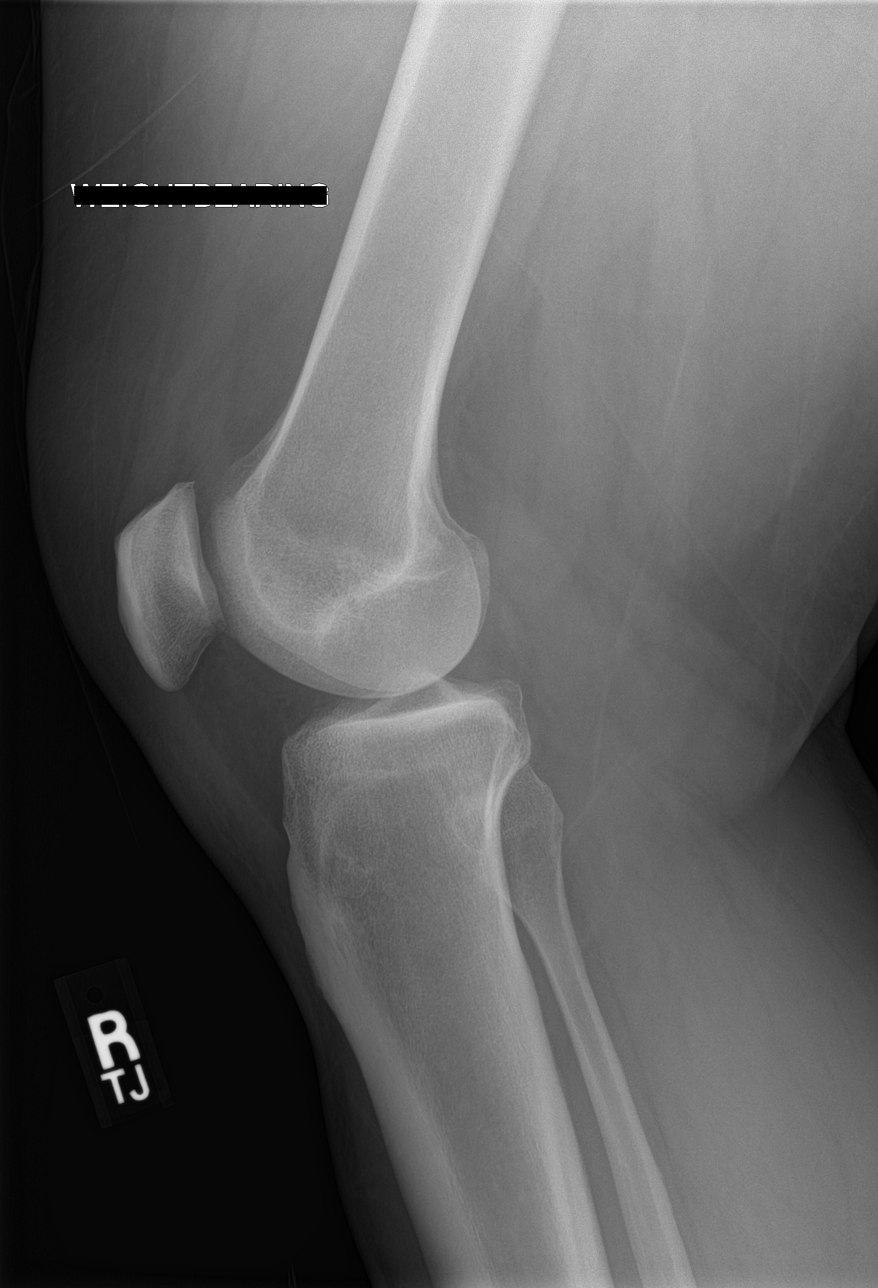

[patella skyline]
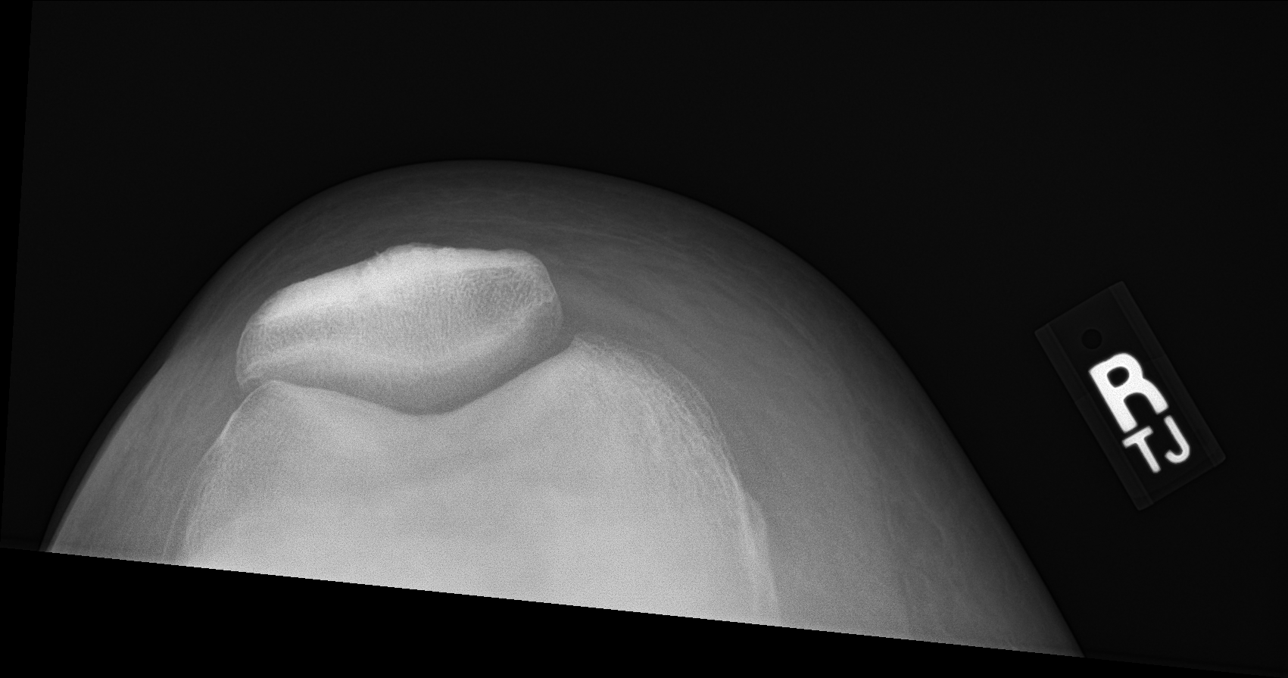

[4 of 4 positions shown; findings below may reference images not displayed]

FINDINGS: Imaging obtained weight-bearing. Minimal medial tibiofemoral joint
space narrowing. Mild patellofemoral spurring, mild lateral patellar
tilt. Trace joint effusion. Possible osseous excrescence from the
proximal fibular lateral aspect of the tibial metaphysis, best
appreciated on the tunnel view, suggesting osteochondroma. No
fracture, dislocation, or bony destructive change. Left knee
included for comparison on AP view is unremarkable.
IMPRESSION: 1. Minimal patellofemoral spurring and lateral patellar tilt.
2. Mild medial tibiofemoral joint space narrowing.
3. Possible small osteochondroma from the proximal fibula or lateral
tibia.

## 2021-06-29 ENCOUNTER — Other Ambulatory Visit: Payer: Self-pay | Admitting: Family Medicine

## 2021-06-29 DIAGNOSIS — R809 Proteinuria, unspecified: Secondary | ICD-10-CM

## 2021-06-29 DIAGNOSIS — E1169 Type 2 diabetes mellitus with other specified complication: Secondary | ICD-10-CM

## 2021-06-29 DIAGNOSIS — Z1159 Encounter for screening for other viral diseases: Secondary | ICD-10-CM

## 2021-06-30 ENCOUNTER — Other Ambulatory Visit (INDEPENDENT_AMBULATORY_CARE_PROVIDER_SITE_OTHER): Payer: 59

## 2021-06-30 DIAGNOSIS — E1169 Type 2 diabetes mellitus with other specified complication: Secondary | ICD-10-CM | POA: Diagnosis not present

## 2021-06-30 DIAGNOSIS — E785 Hyperlipidemia, unspecified: Secondary | ICD-10-CM | POA: Diagnosis not present

## 2021-06-30 DIAGNOSIS — Z1159 Encounter for screening for other viral diseases: Secondary | ICD-10-CM

## 2021-06-30 DIAGNOSIS — R809 Proteinuria, unspecified: Secondary | ICD-10-CM

## 2021-06-30 LAB — LIPID PANEL
Cholesterol: 229 mg/dL — ABNORMAL HIGH (ref 0–200)
HDL: 29.1 mg/dL — ABNORMAL LOW (ref >39.00)
Total CHOL/HDL Ratio: 8
Triglycerides: 916 mg/dL — ABNORMAL HIGH (ref 0.0–149.0)

## 2021-06-30 LAB — MICROALBUMIN / CREATININE URINE RATIO
Creatinine,U: 41.2 mg/dL
Microalb Creat Ratio: 17 mg/g (ref 0.0–30.0)
Microalb, Ur: 7 mg/dL — ABNORMAL HIGH (ref 0.0–1.9)

## 2021-06-30 LAB — COMPREHENSIVE METABOLIC PANEL
ALT: 21 U/L (ref 0–53)
AST: 15 U/L (ref 0–37)
Albumin: 4.2 g/dL (ref 3.5–5.2)
Alkaline Phosphatase: 88 U/L (ref 39–117)
BUN: 12 mg/dL (ref 6–23)
CO2: 27 mEq/L (ref 19–32)
Calcium: 8.8 mg/dL (ref 8.4–10.5)
Chloride: 97 mEq/L (ref 96–112)
Creatinine, Ser: 0.88 mg/dL (ref 0.40–1.50)
GFR: 106.31 mL/min (ref >60.00)
Glucose, Bld: 391 mg/dL — ABNORMAL HIGH (ref 70–99)
Potassium: 4 mEq/L (ref 3.5–5.1)
Sodium: 133 mEq/L — ABNORMAL LOW (ref 135–145)
Total Bilirubin: 0.4 mg/dL (ref 0.2–1.2)
Total Protein: 7.3 g/dL (ref 6.0–8.3)

## 2021-06-30 LAB — HEMOGLOBIN A1C: Hgb A1c MFr Bld: 13.2 % — ABNORMAL HIGH (ref 4.6–6.5)

## 2021-06-30 LAB — LDL CHOLESTEROL, DIRECT: Direct LDL: 77 mg/dL

## 2021-07-01 LAB — HEPATITIS C ANTIBODY
Hepatitis C Ab: NONREACTIVE
SIGNAL TO CUT-OFF: 0.13 (ref <1.00)

## 2021-07-07 ENCOUNTER — Ambulatory Visit (INDEPENDENT_AMBULATORY_CARE_PROVIDER_SITE_OTHER): Payer: 59 | Admitting: Family Medicine

## 2021-07-07 ENCOUNTER — Encounter: Payer: Self-pay | Admitting: Family Medicine

## 2021-07-07 VITALS — BP 164/106 | HR 89 | Temp 97.5°F | Ht 68.5 in | Wt 356.0 lb

## 2021-07-07 DIAGNOSIS — I456 Pre-excitation syndrome: Secondary | ICD-10-CM

## 2021-07-07 DIAGNOSIS — I428 Other cardiomyopathies: Secondary | ICD-10-CM | POA: Diagnosis not present

## 2021-07-07 DIAGNOSIS — Z0001 Encounter for general adult medical examination with abnormal findings: Secondary | ICD-10-CM | POA: Diagnosis not present

## 2021-07-07 DIAGNOSIS — E1129 Type 2 diabetes mellitus with other diabetic kidney complication: Secondary | ICD-10-CM

## 2021-07-07 DIAGNOSIS — E785 Hyperlipidemia, unspecified: Secondary | ICD-10-CM

## 2021-07-07 DIAGNOSIS — R809 Proteinuria, unspecified: Secondary | ICD-10-CM | POA: Diagnosis not present

## 2021-07-07 DIAGNOSIS — E1169 Type 2 diabetes mellitus with other specified complication: Secondary | ICD-10-CM | POA: Diagnosis not present

## 2021-07-07 DIAGNOSIS — I5020 Unspecified systolic (congestive) heart failure: Secondary | ICD-10-CM | POA: Diagnosis not present

## 2021-07-07 DIAGNOSIS — F4321 Adjustment disorder with depressed mood: Secondary | ICD-10-CM

## 2021-07-07 DIAGNOSIS — I152 Hypertension secondary to endocrine disorders: Secondary | ICD-10-CM

## 2021-07-07 DIAGNOSIS — G4733 Obstructive sleep apnea (adult) (pediatric): Secondary | ICD-10-CM | POA: Diagnosis not present

## 2021-07-07 DIAGNOSIS — E1159 Type 2 diabetes mellitus with other circulatory complications: Secondary | ICD-10-CM

## 2021-07-07 MED ORDER — TRULICITY 1.5 MG/0.5ML ~~LOC~~ SOAJ: 1.5000 mg | SUBCUTANEOUS | 3 refills | Status: DC

## 2021-07-07 MED ORDER — AMLODIPINE BESYLATE 5 MG PO TABS: 5.0000 mg | ORAL_TABLET | Freq: Every day | ORAL | 11 refills | Status: DC

## 2021-07-07 MED ORDER — ATORVASTATIN CALCIUM 20 MG PO TABS: 20.0000 mg | ORAL_TABLET | Freq: Every day | ORAL | 3 refills | Status: DC

## 2021-07-07 MED ORDER — TRULICITY 0.75 MG/0.5ML ~~LOC~~ SOAJ: 0.7500 mg | SUBCUTANEOUS | 0 refills | Status: DC

## 2021-07-07 NOTE — Assessment & Plan Note (Signed)
Preventative protocols reviewed and updated unless pt declined. Discussed healthy diet and lifestyle.  

## 2021-07-07 NOTE — Progress Notes (Signed)
? ? Patient ID: Alvin Phillips, male    DOB: October 21, 1980, 41 y.o.   MRN: [id number] ? ?This visit was conducted in person. ? ?BP (!) 164/106 (BP Location: Right Arm, Cuff Size: Large)   Pulse 89   Temp (!) 97.5 ?F (36.4 ?C) (Temporal)   Ht 5' 8.5" (1.74 m)   Wt (!) 356 lb (161.5 kg)   SpO2 96%   BMI 53.34 kg/m?   ?BP Readings from Last 3 Encounters:  ?07/07/21 (!) 164/106  ?10/09/20 (!) 160/100  ?10/05/20 (!) 142/100  ? ?CC: CPE ?Subjective:  ? ?HPI: ?Alvin Phillips is a 41 y.o. male presenting on 07/07/2021 for Annual Exam (Pt states he is still on Victoza 0.6 mg daily. Did not realize he was to increase dose each wk.) ? ? ?Last seen 09/2020, plan was return for 3 mo CPE. Overdue for PCP and cardiology f/u.  ?Very upset about difficulty in getting meds refilled earlier this year. I asked him to reach out to patient experience.  ?Previously worked for [employer] - out of work for the past month.  ? ?DM - was on ozempic with benefit - then due to insurance changes this was no longer covered. Now on daily victoza started 04/2021 currently on 1.8mg . Will see if insurance will cover weekly trulicity - sent this in to the pharmacy. H/o balanitis to SGLT2. Recommended insulin - declines. Not checking sugars.  ? ?Known h/o WPW, chronic HFrEF on entresto and metoprolol followed by Dr End last seen 09/2020, at that time recommended 6-8 wk f/u. Did not follow up. Now on metoprolol succinate 50mg  daily, continues Entresto 40/51mg  bid. Not check BP at home.  ? ?OSA - continues CPAP machine off and on.  ? ?Preventative: ?Flu shot declines  ?Td 01/2020 ?COVID vaccine - declined ?Seat belt use discussed ?Sunscreen use discussed. No changing moles on skin.  ?Non smoker ?Alcohol - none ?Rec drugs - MJ ?Dentist - yearly ?Eye exam - DUE  ? ?   ? ?Relevant past medical, surgical, family and social history reviewed and updated as indicated. Interim medical history since our last visit reviewed. ?Allergies and medications reviewed and  updated. ?Outpatient Medications Prior to Visit  ?Medication Sig Dispense Refill  ? Acetaminophen (TYLENOL PO) Take by mouth as needed.    ? Cyanocobalamin (B-12) 1000 MCG SUBL Place 1 tablet under the tongue daily.    ? furosemide (LASIX) 40 MG tablet Take 40 mg by mouth as needed.    ? glucose blood (BAYER CONTOUR NEXT TEST) test strip Use as instructed 100 each 5  ? Lancets (ONETOUCH ULTRASOFT) lancets Use as instructed 100 each 5  ? metoprolol succinate (TOPROL-XL) 50 MG 24 hr tablet Take 1 tablet (50 mg total) by mouth daily. 30 tablet 6  ? pantoprazole (PROTONIX) 20 MG tablet TAKE 1 TABLET BY MOUTH ONCE DAILY AS NEEDED FOR  HEARTBURN  OR  INDIGESTION 30 tablet 5  ? sildenafil (VIAGRA) 50 MG tablet Take 1 tablet (50 mg total) by mouth daily as needed for erectile dysfunction. 6 tablet 6  ? glimepiride (AMARYL) 1 MG tablet Take 1 tablet (1 mg total) by mouth daily with breakfast. 30 tablet 11  ? liraglutide (VICTOZA) 18 MG/3ML SOPN Inject 0.6 mg into the skin daily for 7 days, THEN 1.2 mg daily for 7 days, THEN 1.8 mg daily. 15 mL 3  ? ENTRESTO 49-51 MG Take 1 tablet by mouth 2 (two) times daily.    ? ?No facility-administered medications  prior to visit.  ?  ? ?Per HPI unless specifically indicated in ROS section below ?Review of Systems  ?Constitutional:  Negative for activity change, appetite change, chills, fatigue, fever and unexpected weight change.  ?HENT:  Negative for hearing loss.   ?Eyes:  Negative for visual disturbance.  ?Respiratory:  Negative for cough, chest tightness, shortness of breath and wheezing.   ?Cardiovascular:  Positive for chest pain and palpitations. Negative for leg swelling.  ?Gastrointestinal:  Negative for abdominal distention, abdominal pain, blood in stool, constipation, diarrhea, nausea and vomiting.  ?Genitourinary:  Negative for difficulty urinating and hematuria.  ?Musculoskeletal:  Negative for arthralgias, myalgias and neck pain.  ?Skin:  Negative for rash.  ?Neurological:   Positive for headaches. Negative for dizziness, seizures and syncope.  ?Hematological:  Negative for adenopathy. Does not bruise/bleed easily.  ?Psychiatric/Behavioral:  Positive for dysphoric mood. The patient is not nervous/anxious.   ? ?Objective:  ?BP (!) 164/106 (BP Location: Right Arm, Cuff Size: Large)   Pulse 89   Temp (!) 97.5 ?F (36.4 ?C) (Temporal)   Ht 5' 8.5" (1.74 m)   Wt (!) 356 lb (161.5 kg)   SpO2 96%   BMI 53.34 kg/m?   ?Wt Readings from Last 3 Encounters:  ?07/07/21 (!) 356 lb (161.5 kg)  ?10/09/20 (!) 348 lb (157.9 kg)  ?10/05/20 (!) 350 lb 7 oz (159 kg)  ?  ?  ?Physical Exam ?Vitals and nursing note reviewed.  ?Constitutional:   ?   General: He is not in acute distress. ?   Appearance: Normal appearance. He is well-developed. He is not ill-appearing.  ?HENT:  ?   Head: Normocephalic and atraumatic.  ?   Right Ear: Hearing, tympanic membrane, ear canal and external ear normal.  ?   Left Ear: Hearing, tympanic membrane, ear canal and external ear normal.  ?Eyes:  ?   General: No scleral icterus. ?   Extraocular Movements: Extraocular movements intact.  ?   Conjunctiva/sclera: Conjunctivae normal.  ?   Pupils: Pupils are equal, round, and reactive to light.  ?Neck:  ?   Thyroid: No thyroid mass or thyromegaly.  ?Cardiovascular:  ?   Rate and Rhythm: Normal rate and regular rhythm.  ?   Pulses: Normal pulses.     ?     Radial pulses are 2+ on the right side and 2+ on the left side.  ?   Heart sounds: Normal heart sounds. No murmur heard. ?Pulmonary:  ?   Effort: Pulmonary effort is normal. No respiratory distress.  ?   Breath sounds: Normal breath sounds. No wheezing, rhonchi or rales.  ?Abdominal:  ?   General: Bowel sounds are normal. There is no distension.  ?   Palpations: Abdomen is soft. There is no mass.  ?   Tenderness: There is no abdominal tenderness. There is no guarding or rebound.  ?   Hernia: No hernia is present.  ?Musculoskeletal:     ?   General: Normal range of motion.  ?    Cervical back: Normal range of motion and neck supple.  ?   Right lower leg: No edema.  ?   Left lower leg: No edema.  ?Lymphadenopathy:  ?   Cervical: No cervical adenopathy.  ?Skin: ?   General: Skin is warm and dry.  ?   Findings: No rash.  ?Neurological:  ?   General: No focal deficit present.  ?   Mental Status: He is alert and oriented to person, place,  and time.  ?Psychiatric:     ?   Mood and Affect: Mood normal.     ?   Behavior: Behavior normal.     ?   Thought Content: Thought content normal.     ?   Judgment: Judgment normal.  ? ?   ?Results for orders placed or performed in visit on 06/30/21  ?Hepatitis C antibody  ?Result Value Ref Range  ? Hepatitis C Ab NON-REACTIVE NON-REACTIVE  ? SIGNAL TO CUT-OFF 0.13 <1.00  ?Microalbumin / creatinine urine ratio  ?Result Value Ref Range  ? Microalb, Ur 7.0 (H) 0.0 - 1.9 mg/dL  ? Creatinine,U 41.2 mg/dL  ? Microalb Creat Ratio 17.0 0.0 - 30.0 mg/g  ?Hemoglobin A1c  ?Result Value Ref Range  ? Hgb A1c MFr Bld 13.2 (H) 4.6 - 6.5 %  ?Comprehensive metabolic panel  ?Result Value Ref Range  ? Sodium 133 (L) 135 - 145 mEq/L  ? Potassium 4.0 3.5 - 5.1 mEq/L  ? Chloride 97 96 - 112 mEq/L  ? CO2 27 19 - 32 mEq/L  ? Glucose, Bld 391 (H) 70 - 99 mg/dL  ? BUN 12 6 - 23 mg/dL  ? Creatinine, Ser 0.88 0.40 - 1.50 mg/dL  ? Total Bilirubin 0.4 0.2 - 1.2 mg/dL  ? Alkaline Phosphatase 88 39 - 117 U/L  ? AST 15 0 - 37 U/L  ? ALT 21 0 - 53 U/L  ? Total Protein 7.3 6.0 - 8.3 g/dL  ? Albumin 4.2 3.5 - 5.2 g/dL  ? GFR 106.31 >60.00 mL/min  ? Calcium 8.8 8.4 - 10.5 mg/dL  ?Lipid panel  ?Result Value Ref Range  ? Cholesterol 229 (H) 0 - 200 mg/dL  ? Triglycerides (H) 0.0 - 149.0 mg/dL  ?  295.1 Triglyceride is over 400; calculations on Lipids are invalid.  ? HDL 29.10 (L) >39.00 mg/dL  ? Total CHOL/HDL Ratio 8   ?LDL cholesterol, direct  ?Result Value Ref Range  ? Direct LDL 77.0 mg/dL  ? ? ?  07/07/2021  ? 10:57 AM 01/17/2018  ?  9:31 AM 10/24/2016  ? 12:22 PM 07/22/2013  ?  1:29 PM   ?Depression screen PHQ 2/9  ?Decreased Interest 2 0 0 1  ?Down, Depressed, Hopeless 2 1 0 1  ?PHQ - 2 Score 4 1 0 2  ?Altered sleeping 2   3  ?Tired, decreased energy 3   2  ?Change in appetite 1   1  ?Feeling bad or failure about

## 2021-07-07 NOTE — Patient Instructions (Signed)
Call [phone] to give feed back about your recent experience.  ?Start trulicity 0.75mg  weekly for 1 month then increase to 1.5mg  weekly - meds sent to [facility] pharmacy.  ?Call Dr Serita Kyle office for follow up appointment.  ?I recommend pneumonia shot in diabetes history.  ?Schedule diabetic eye exam at our next screening day.  ?Start atorvastatin 20mg  daily for cholesterol levels  ?Good to see you today. ?Return in 3 months for diabetes follow up visit.  ? ?Health Maintenance, Male ?Adopting a healthy lifestyle and getting preventive care are important in promoting health and wellness. Ask your health care provider about: ?The right schedule for you to have regular tests and exams. ?Things you can do on your own to prevent diseases and keep yourself healthy. ?What should I know about diet, weight, and exercise? ?Eat a healthy diet ? ?Eat a diet that includes plenty of vegetables, fruits, low-fat dairy products, and lean protein. ?Do not eat a lot of foods that are high in solid fats, added sugars, or sodium. ?Maintain a healthy weight ?Body mass index (BMI) is a measurement that can be used to identify possible weight problems. It estimates body fat based on height and weight. Your health care provider can help determine your BMI and help you achieve or maintain a healthy weight. ?Get regular exercise ?Get regular exercise. This is one of the most important things you can do for your health. Most adults should: ?Exercise for at least 150 minutes each week. The exercise should increase your heart rate and make you sweat (moderate-intensity exercise). ?Do strengthening exercises at least twice a week. This is in addition to the moderate-intensity exercise. ?Spend less time sitting. Even light physical activity can be beneficial. ?Watch cholesterol and blood lipids ?Have your blood tested for lipids and cholesterol at 42 years of age, then have this test every 5 years. ?You may need to have your cholesterol levels  checked more often if: ?Your lipid or cholesterol levels are high. ?You are older than 42 years of age. ?You are at high risk for heart disease. ?What should I know about cancer screening? ?Many types of cancers can be detected early and may often be prevented. Depending on your health history and family history, you may need to have cancer screening at various ages. This may include screening for: ?Colorectal cancer. ?Prostate cancer. ?Skin cancer. ?Lung cancer. ?What should I know about heart disease, diabetes, and high blood pressure? ?Blood pressure and heart disease ?High blood pressure causes heart disease and increases the risk of stroke. This is more likely to develop in people who have high blood pressure readings or are overweight. ?Talk with your health care provider about your target blood pressure readings. ?Have your blood pressure checked: ?Every 3-5 years if you are 42-54 years of age. ?Every year if you are 74 years old or older. ?If you are between the ages of 33 and 66 and are a current or former smoker, ask your health care provider if you should have a one-time screening for abdominal aortic aneurysm (AAA). ?Diabetes ?Have regular diabetes screenings. This checks your fasting blood sugar level. Have the screening done: ?Once every three years after age 83 if you are at a normal weight and have a low risk for diabetes. ?More often and at a younger age if you are overweight or have a high risk for diabetes. ?What should I know about preventing infection? ?Hepatitis B ?If you have a higher risk for hepatitis B, you should be  screened for this virus. Talk with your health care provider to find out if you are at risk for hepatitis B infection. ?Hepatitis C ?Blood testing is recommended for: ?Everyone born from [birth year] through 1966. ?Anyone with known risk factors for hepatitis C. ?Sexually transmitted infections (STIs) ?You should be screened each year for STIs, including gonorrhea and chlamydia,  if: ?You are sexually active and are younger than 42 years of age. ?You are older than 42 years of age and your health care provider tells you that you are at risk for this type of infection. ?Your sexual activity has changed since you were last screened, and you are at increased risk for chlamydia or gonorrhea. Ask your health care provider if you are at risk. ?Ask your health care provider about whether you are at high risk for HIV. Your health care provider may recommend a prescription medicine to help prevent HIV infection. If you choose to take medicine to prevent HIV, you should first get tested for HIV. You should then be tested every 3 months for as long as you are taking the medicine. ?Follow these instructions at home: ?Alcohol use ?Do not drink alcohol if your health care provider tells you not to drink. ?If you drink alcohol: ?Limit how much you have to 0-2 drinks a day. ?Know how much alcohol is in your drink. In the [place] one drink equals one 12 oz bottle of beer (355 mL), one 5 oz glass of wine (148 mL), or one 1? oz glass of hard liquor (44 mL). ?Lifestyle ?Do not use any products that contain nicotine or tobacco. These products include cigarettes, chewing tobacco, and vaping devices, such as e-cigarettes. If you need help quitting, ask your health care provider. ?Do not use street drugs. ?Do not share needles. ?Ask your health care provider for help if you need support or information about quitting drugs. ?General instructions ?Schedule regular health, dental, and eye exams. ?Stay current with your vaccines. ?Tell your health care provider if: ?You often feel depressed. ?You have ever been abused or do not feel safe at home. ?Summary ?Adopting a healthy lifestyle and getting preventive care are important in promoting health and wellness. ?Follow your health care provider's instructions about healthy diet, exercising, and getting tested or screened for diseases. ?Follow your health care provider's  instructions on monitoring your cholesterol and blood pressure. ?This information is not intended to replace advice given to you by your health care provider. Make sure you discuss any questions you have with your health care provider. ?Document Revised: 07/27/2020 Document Reviewed: 07/27/2020 ?Elsevier Patient Education ? 2023 Elsevier Inc. ? ?

## 2021-07-08 ENCOUNTER — Telehealth: Payer: Self-pay | Admitting: Family Medicine

## 2021-07-08 DIAGNOSIS — F4321 Adjustment disorder with depressed mood: Secondary | ICD-10-CM | POA: Insufficient documentation

## 2021-07-08 MED ORDER — GLIMEPIRIDE 1 MG PO TABS
2.0000 mg | ORAL_TABLET | Freq: Every day | ORAL | 3 refills | Status: DC
Start: 2021-07-08 — End: 2022-07-13

## 2021-07-08 NOTE — Assessment & Plan Note (Signed)
Endorses depressed mood in setting of being unable to work due to side effects of uncontrolled diabetes. Encouraged renewed efforts to achieve BP and sugar control which will help him feel better and be able to return to previous work.  ?

## 2021-07-08 NOTE — Assessment & Plan Note (Signed)
Intermittent CPAP use.  ?

## 2021-07-08 NOTE — Assessment & Plan Note (Addendum)
Marked hypertriglyceridemia largely driven by hyperglycemia. Will reassess once better glycemic control achieved. Reviewed indication for statin in diabetic. Start atorvastatin 20mg  daily. ?The 10-year ASCVD risk score (Arnett DK, et al., 2019) is: 20.1% ?  Values used to calculate the score: ?    Age: 41 years ?    Sex: Male ?    Is Non-Hispanic African American: Yes ?    Diabetic: Yes ?    Tobacco smoker: No ?    Systolic Blood Pressure: 164 mmHg ?    Is BP treated: Yes ?    HDL Cholesterol: 29.1 mg/dL ?    Total Cholesterol: 229 mg/dL  ?

## 2021-07-08 NOTE — Assessment & Plan Note (Deleted)
Umicroalb stable. ?Avoiding SGLT2i in h/o groin infections.  ?

## 2021-07-08 NOTE — Telephone Encounter (Signed)
Spoke with pt relaying Dr. Timoteo Expose message.  Pt verbalizes understanding.  Says the current meter he uses was given to him by his aunt and is old and requests a new one.  I recommended pt contact his ins co to see what brand/model his ins co covers then let us know. Pt verbalizes understanding and will call back with that info.  ?

## 2021-07-08 NOTE — Assessment & Plan Note (Signed)
Appreciate EP care - continues Toprol XL 50mg  daily.  ?

## 2021-07-08 NOTE — Assessment & Plan Note (Signed)
Hopeful for weight loss benefit with trulicity. ?Consider mounjaro.  ?

## 2021-07-08 NOTE — Assessment & Plan Note (Signed)
Chronically elevated. This is despite toprol XL 50mg  daily, Entreto 49/51mg  bid.  ?Will add amlodipine 5mg  daily and encouraged cardiology f/u.  ?

## 2021-07-08 NOTE — Telephone Encounter (Signed)
Plz notify - I'd like him to increase amaryl (glimepiride) to 2mg  with breakfast while we start on trulicity titration.  ?If needed, he can increase to 4mg  glimepiride.  ?Do recommend he start checking sugars at home - does he need Rx for Contour Next glucometer or strips?  ?

## 2021-07-08 NOTE — Assessment & Plan Note (Signed)
Encouraged cardiology f/u.  ?

## 2021-07-08 NOTE — Assessment & Plan Note (Addendum)
Chronic, uncontrolled. Missed planned DM f/u last year.  ?Sugar was much better controlled on once weekly ozempic until insurance stopped covering. He is not doing well with victoza. I have sent trulciity for patient to price out.  ?Increase amaryl to 2mg  daily with breakfast while titrating trulicity. ?H/o metformin intolerance.  ?I did ask him to return in 3 months for DM f/u visit.  ?He is not interested in insulin.  ?

## 2021-07-08 NOTE — Assessment & Plan Note (Signed)
H/o this however Umicroalb normal today.  ?Avoiding SGLT2i in h/o groin infections.  ?

## 2021-07-09 ENCOUNTER — Telehealth: Payer: Self-pay

## 2021-07-09 NOTE — Telephone Encounter (Signed)
Received faxed PA request for Trulicity 0.75 mg/0.5 mL SOPN. ? ?Submitted PA; key:  [id number], Rx #:  [id number].  Decision pending.  ?

## 2021-07-16 NOTE — Telephone Encounter (Signed)
Received faxed PA form from [facility] for Trulicity.  Placed form in Dr. Timoteo Expose box.  ?

## 2021-07-19 NOTE — Telephone Encounter (Signed)
Faxed and confirmed. Placed in Lisa's "faxed stuff" folder. ?

## 2021-07-19 NOTE — Telephone Encounter (Signed)
Filled and in Lisa's box 

## 2021-08-31 ENCOUNTER — Telehealth: Payer: Self-pay | Admitting: Family Medicine

## 2021-08-31 NOTE — Telephone Encounter (Signed)
This PA came up as N/A, your request can not be processed.  I have started a new prior auth for Trulicity 0.75MG /0.5ML pen-injectors, Jasai Bordon Key: [id number] - PA Case ID: [id number] - Rx #: [id number].  Answered insurance questions.  Waiting for determination.

## 2021-08-31 NOTE — Telephone Encounter (Signed)
Prior auth started for Trulicity 0.75MG /0.5ML pen-injectors. Alvin Phillips KeyShann Medal - Rx #: [id number] Waiting for determination.

## 2021-08-31 NOTE — Telephone Encounter (Signed)
Patient needs a prior auth for his trulicity. He has been out of meds for about 2 weeks. He called insurance and they said that our ofiice needs to call with more information. The phone number is [phone].

## 2021-08-31 NOTE — Telephone Encounter (Signed)
Spoke with pt asking if he's started Trulicity 0.75 mg.  Says he has not due to being unemployed.  However, has paid insurance and was told by pharmacy a PA was needed.    PA form for 1.5 mg was completed and faxed to [facility] on 07/19/21.  However, we never received a decision. Will print and resend this form.  Also, will submit PA for 0.75 mg.

## 2021-08-31 NOTE — Telephone Encounter (Signed)
Patient uses Human resources officer in [place]

## 2021-09-01 NOTE — Telephone Encounter (Signed)
Received faxed PA approval for 1.5 mg, valid 08/31/2021- 09/01/2022.

## 2021-09-02 NOTE — Telephone Encounter (Signed)
Faxed prior auth form for Trulicity pen injectors 0.75 mg/0.5 ml to [employer] at [phone].  Waiting for determination.

## 2021-09-08 NOTE — Telephone Encounter (Signed)
Received fax from [facility].  Prior Berkley Harvey is not required for Trulicity 0.75 MG/0.5 ML pen injectors.  Sent letter to scanning.

## 2021-09-09 NOTE — Telephone Encounter (Signed)
Spoke with [facility] asking if pt picked up 0.75 mg dose yet.  Confirms he did on 08/31/21.

## 2021-09-15 ENCOUNTER — Ambulatory Visit (INDEPENDENT_AMBULATORY_CARE_PROVIDER_SITE_OTHER): Payer: 59 | Admitting: Family Medicine

## 2021-09-15 ENCOUNTER — Encounter: Payer: Self-pay | Admitting: Family Medicine

## 2021-09-15 VITALS — BP 134/90 | HR 84 | Temp 97.7°F | Ht 68.5 in | Wt 356.2 lb

## 2021-09-15 DIAGNOSIS — E1159 Type 2 diabetes mellitus with other circulatory complications: Secondary | ICD-10-CM | POA: Diagnosis not present

## 2021-09-15 DIAGNOSIS — E785 Hyperlipidemia, unspecified: Secondary | ICD-10-CM

## 2021-09-15 DIAGNOSIS — E1169 Type 2 diabetes mellitus with other specified complication: Secondary | ICD-10-CM

## 2021-09-15 DIAGNOSIS — I5020 Unspecified systolic (congestive) heart failure: Secondary | ICD-10-CM

## 2021-09-15 DIAGNOSIS — I152 Hypertension secondary to endocrine disorders: Secondary | ICD-10-CM

## 2021-09-15 DIAGNOSIS — E538 Deficiency of other specified B group vitamins: Secondary | ICD-10-CM | POA: Insufficient documentation

## 2021-09-15 DIAGNOSIS — I428 Other cardiomyopathies: Secondary | ICD-10-CM

## 2021-09-15 LAB — VITAMIN B12: Vitamin B-12: 356 pg/mL (ref 211–911)

## 2021-09-15 MED ORDER — METOPROLOL SUCCINATE ER 50 MG PO TB24: 50.0000 mg | ORAL_TABLET | Freq: Every day | ORAL | 3 refills | Status: DC

## 2021-09-15 NOTE — Assessment & Plan Note (Addendum)
Update B12 levels off regular replacement.

## 2021-09-15 NOTE — Progress Notes (Signed)
Patient ID: Alvin Phillips, male    DOB: 14-Sep-1980, 41 y.o.   MRN: [id number]  This visit was conducted in person.  BP 134/90   Pulse 84   Temp 97.7 F (36.5 C) (Temporal)   Ht 5' 8.5" (1.74 m)   Wt (!) 356 lb 4 oz (161.6 kg)   SpO2 96%   BMI 53.38 kg/m    CC: DM f/u visit  Subjective:   HPI: Alvin Phillips is a 41 y.o. male presenting on 09/15/2021 for Diabetes (Here for f/u.)   New job at [employer]. Has been mowing yards recently.  Not fasting today.   Hypertension-well-controlled on current regimen Known h/o WPW, chronic HFrEF on entresto and amlodipine followed by Dr End last seen 09/2020. Ran out of toprol XL. Rare lasix use.  OSA - continues CPAP machine off and on.  DM - does not regularly check sugars. Compliant with antihyperglycemic regimen which includes: trulicity 0.75mg  weekly x3 wks, planning to increase to 1.5mg  weekly, also continues glimepiride 2mg  daily. H/o balanitis to SGLT2.  Declines insulin.  History of metformin intolerance.  Denies low sugars or hypoglycemic symptoms. Denies paresthesias, blurry vision. Last diabetic eye exam DUE. Glucometer brand: doesn't have meter. Last foot exam: 09/2020 - DUE. DSME: . Lab Results  Component Value Date   HGBA1C 13.2 (H) 06/30/2021   Diabetic Foot Exam - Simple   Simple Foot Form Diabetic Foot exam was performed with the following findings: Yes 09/15/2021 10:02 AM  Visual Inspection See comments: Yes Sensation Testing Intact to touch and monofilament testing bilaterally: Yes Pulse Check Posterior Tibialis and Dorsalis pulse intact bilaterally: Yes Comments Dry skin to soles    Lab Results  Component Value Date   MICROALBUR 7.0 (H) 06/30/2021    Has not been taking b12 supplement.      Relevant past medical, surgical, family and social history reviewed and updated as indicated. Interim medical history since our last visit reviewed. Allergies and medications reviewed and updated. Outpatient Medications  Prior to Visit  Medication Sig Dispense Refill   amLODipine (NORVASC) 5 MG tablet Take 1 tablet (5 mg total) by mouth daily. 30 tablet 11   atorvastatin (LIPITOR) 20 MG tablet Take 1 tablet (20 mg total) by mouth daily. 90 tablet 3   ENTRESTO 49-51 MG Take 1 tablet by mouth 2 (two) times daily.     furosemide (LASIX) 40 MG tablet Take 40 mg by mouth as needed.     glimepiride (AMARYL) 1 MG tablet Take 2 tablets (2 mg total) by mouth daily with breakfast. 90 tablet 3   pantoprazole (PROTONIX) 20 MG tablet TAKE 1 TABLET BY MOUTH ONCE DAILY AS NEEDED FOR  HEARTBURN  OR  INDIGESTION 30 tablet 5   sildenafil (VIAGRA) 50 MG tablet Take 1 tablet (50 mg total) by mouth daily as needed for erectile dysfunction. 6 tablet 6   Dulaglutide (TRULICITY) 0.75 MG/0.5ML SOPN Inject 0.75 mg into the skin once a week. 2 mL 0   Acetaminophen (TYLENOL PO) Take by mouth as needed.     Cyanocobalamin (B-12) 1000 MCG SUBL Place 1 tablet under the tongue daily. (Patient not taking: Reported on 09/15/2021)     Dulaglutide (TRULICITY) 1.5 MG/0.5ML SOPN Inject 1.5 mg into the skin once a week. Start after the first month of 0.75mg  dose (Patient not taking: Reported on 09/15/2021) 2 mL 3   glucose blood (BAYER CONTOUR NEXT TEST) test strip Use as instructed (Patient not taking: Reported  on 09/15/2021) 100 each 5   Lancets (ONETOUCH ULTRASOFT) lancets Use as instructed (Patient not taking: Reported on 09/15/2021) 100 each 5   metoprolol succinate (TOPROL-XL) 50 MG 24 hr tablet Take 1 tablet (50 mg total) by mouth daily. (Patient not taking: Reported on 09/15/2021) 30 tablet 6   No facility-administered medications prior to visit.     Per HPI unless specifically indicated in ROS section below Review of Systems  Objective:  BP 134/90   Pulse 84   Temp 97.7 F (36.5 C) (Temporal)   Ht 5' 8.5" (1.74 m)   Wt (!) 356 lb 4 oz (161.6 kg)   SpO2 96%   BMI 53.38 kg/m   Wt Readings from Last 3 Encounters:  09/15/21 (!) 356 lb 4  oz (161.6 kg)  07/07/21 (!) 356 lb (161.5 kg)  10/09/20 (!) 348 lb (157.9 kg)      Physical Exam Vitals and nursing note reviewed.  Constitutional:      Appearance: Normal appearance. He is obese. He is not ill-appearing.  Eyes:     Extraocular Movements: Extraocular movements intact.     Conjunctiva/sclera: Conjunctivae normal.     Pupils: Pupils are equal, round, and reactive to light.  Cardiovascular:     Rate and Rhythm: Normal rate and regular rhythm.     Pulses: Normal pulses.     Heart sounds: Normal heart sounds. No murmur heard. Pulmonary:     Effort: Pulmonary effort is normal. No respiratory distress.     Breath sounds: Normal breath sounds. No wheezing, rhonchi or rales.  Musculoskeletal:     Right lower leg: No edema.     Left lower leg: No edema.     Comments: See HPI for foot exam if done  Skin:    General: Skin is warm and dry.     Findings: No rash.  Neurological:     Mental Status: He is alert.  Psychiatric:        Mood and Affect: Mood normal.        Behavior: Behavior normal.       Results for orders placed or performed in visit on 06/30/21  Hepatitis C antibody  Result Value Ref Range   Hepatitis C Ab NON-REACTIVE NON-REACTIVE   SIGNAL TO CUT-OFF 0.13 <1.00  Microalbumin / creatinine urine ratio  Result Value Ref Range   Microalb, Ur 7.0 (H) 0.0 - 1.9 mg/dL   Creatinine,U 38.9 mg/dL   Microalb Creat Ratio 17.0 0.0 - 30.0 mg/g  Hemoglobin A1c  Result Value Ref Range   Hgb A1c MFr Bld 13.2 (H) 4.6 - 6.5 %  Comprehensive metabolic panel  Result Value Ref Range   Sodium 133 (L) 135 - 145 mEq/L   Potassium 4.0 3.5 - 5.1 mEq/L   Chloride 97 96 - 112 mEq/L   CO2 27 19 - 32 mEq/L   Glucose, Bld 391 (H) 70 - 99 mg/dL   BUN 12 6 - 23 mg/dL   Creatinine, Ser 3.73 0.40 - 1.50 mg/dL   Total Bilirubin 0.4 0.2 - 1.2 mg/dL   Alkaline Phosphatase 88 39 - 117 U/L   AST 15 0 - 37 U/L   ALT 21 0 - 53 U/L   Total Protein 7.3 6.0 - 8.3 g/dL   Albumin 4.2  3.5 - 5.2 g/dL   GFR 428.76 >81.15 mL/min   Calcium 8.8 8.4 - 10.5 mg/dL  Lipid panel  Result Value Ref Range   Cholesterol 229 (H) 0 -  200 mg/dL   Triglycerides (H) 0.0 - 149.0 mg/dL    440.1 Triglyceride is over 400; calculations on Lipids are invalid.   HDL 29.10 (L) >39.00 mg/dL   Total CHOL/HDL Ratio 8   LDL cholesterol, direct  Result Value Ref Range   Direct LDL 77.0 mg/dL   Lab Results  Component Value Date   VITAMINB12 230 02/10/2020   Assessment & Plan:   Problem List Items Addressed This Visit     Type 2 diabetes mellitus with other specified complication (HCC) - Primary    Chronic. Just recently restarted trulicity, planning to increase weekly dose to 1.5mg  in 2 wks. Seems to be tolerating well. Continues amaryl 2mg  with breakfast. Will update fructosamine levels.       Relevant Orders   Fructosamine   Hypertension associated with type 2 diabetes mellitus (HCC)    Chronic, improved control on regular entresto and amlodipine 5mg  daily. Had run out of Toprol XL - have refilled today, advised hold amlodipine if he develops symptoms of hypotension.       Relevant Medications   metoprolol succinate (TOPROL-XL) 50 MG 24 hr tablet   HFrEF (heart failure with reduced ejection fraction) (HCC)    Encouraged cards f/u.       Relevant Medications   metoprolol succinate (TOPROL-XL) 50 MG 24 hr tablet   Nonischemic cardiomyopathy (HCC)   Relevant Medications   metoprolol succinate (TOPROL-XL) 50 MG 24 hr tablet   Hyperlipidemia associated with type 2 diabetes mellitus (HCC)    He is now regularly taking atorvatatin. Not fasting today. Update dLDL.       Relevant Medications   metoprolol succinate (TOPROL-XL) 50 MG 24 hr tablet   Other Relevant Orders   LDL Cholesterol, Direct   Low serum vitamin B12    Update B12 levels off regular replacement.      Relevant Orders   Vitamin B12     Meds ordered this encounter  Medications   metoprolol succinate (TOPROL-XL)  50 MG 24 hr tablet    Sig: Take 1 tablet (50 mg total) by mouth daily.    Dispense:  90 tablet    Refill:  3   Orders Placed This Encounter  Procedures   Vitamin B12   Fructosamine   LDL Cholesterol, Direct     Patient Instructions  We are a little early for A1c so we will check fructosamine today. Let know preferred insurance brand for glucometer.  Schedule yearly diabetic eye exam  Schedule heart doctor appointment Good to see you today. Thanks for bringing in your medicines today.  I've refilled Toprol XL 50mg  for you. If you have low blood pressures <110/60 or low BP symptoms like dizziness, hold amlodipine.   Follow up plan: Return in about 3 months (around 12/16/2021) for follow up visit.  [place], MD

## 2021-09-15 NOTE — Assessment & Plan Note (Signed)
Encouraged cards f/u.

## 2021-09-15 NOTE — Patient Instructions (Addendum)
We are a little early for A1c so we will check fructosamine today. Let us know preferred insurance brand for glucometer.  Schedule yearly diabetic eye exam  Schedule heart doctor appointment Good to see you today. Thanks for bringing in your medicines today.  I've refilled Toprol XL 50mg  for you. If you have low blood pressures <110/60 or low BP symptoms like dizziness, hold amlodipine.

## 2021-09-15 NOTE — Assessment & Plan Note (Addendum)
Chronic. Just recently restarted trulicity, planning to increase weekly dose to 1.5mg  in 2 wks. Seems to be tolerating well. Continues amaryl 2mg  with breakfast. Will update fructosamine levels.

## 2021-09-15 NOTE — Assessment & Plan Note (Addendum)
He is now regularly taking atorvatatin. Not fasting today. Update dLDL.

## 2021-09-15 NOTE — Assessment & Plan Note (Addendum)
Chronic, improved control on regular entresto and amlodipine 5mg  daily. Had run out of Toprol XL - have refilled today, advised hold amlodipine if he develops symptoms of hypotension.

## 2021-09-16 LAB — LDL CHOLESTEROL, DIRECT: Direct LDL: 84 mg/dL

## 2021-09-19 LAB — FRUCTOSAMINE: Fructosamine: 434 umol/L — ABNORMAL HIGH (ref 205–285)

## 2021-09-22 ENCOUNTER — Telehealth: Payer: Self-pay

## 2021-09-22 NOTE — Telephone Encounter (Signed)
Attempted to contact pt.  No answer.  No vm.  Need to relay results (see Labs, Result Notes- 6/28, 09/16/21) and get answer to Dr. G's question.  Labs/Dr. G's question: Your LDL (bad cholesterol) level returned okay as did your vitamin B12 levels. Continue vitamin B12 replacement.  Your fructosamine return equivalent to an A1c of 10.9%, which is an improvement from previous 13% value, but still remaining elevated. Hopefully, you tolerate Trulicity well and it helps lower these sugar readings. Were you able to find out what brand & model glucometer your insurance prefers? 

## 2021-09-23 NOTE — Telephone Encounter (Signed)
Pt rtn call.  I relayed results and Dr. Timoteo Expose message.  Pt verbalizes understanding and states his is doing good with Trulicity.  He will contact ins tomorrow since he will be off and will call us with glucometer info.

## 2021-09-23 NOTE — Telephone Encounter (Signed)
Attempted to contact pt.  No answer.  No vm.  Need to relay results (see Labs, Result Notes- 6/28, 09/16/21) and get answer to Dr. Timoteo Expose question.  Labs/Dr. Timoteo Expose question: Your LDL (bad cholesterol) level returned okay as did your vitamin B12 levels. Continue vitamin B12 replacement.  Your fructosamine return equivalent to an A1c of 10.9%, which is an improvement from previous 13% value, but still remaining elevated. Hopefully, you tolerate Trulicity well and it helps lower these sugar readings. Were you able to find out what brand & model glucometer your insurance prefers?

## 2021-09-29 LAB — HM DIABETES EYE EXAM

## 2021-10-14 ENCOUNTER — Encounter: Payer: Self-pay | Admitting: Primary Care

## 2021-10-14 ENCOUNTER — Telehealth: Payer: Self-pay

## 2021-10-14 NOTE — Telephone Encounter (Signed)
Spoke with pt relaying eye exam results and to rpt exam in 1 yr.  Pt verbalizes understanding.

## 2021-10-14 NOTE — Telephone Encounter (Signed)
-----   Message from Doreene Nest, NP sent at 10/14/2021  6:58 AM EDT ----- Regarding: DR Eye Exam Eye exam negative for DR. I abstracted this information in his chart and placed a copy of his results in G's inbox. Patient will need to be notified of results.

## 2021-11-16 ENCOUNTER — Other Ambulatory Visit: Payer: Self-pay | Admitting: Internal Medicine

## 2021-11-16 NOTE — Telephone Encounter (Signed)
Please contact pt for appt.  Last seen 7-22 no 6-8 week follow up. Rx refill pending appt.  Thank you

## 2021-11-16 NOTE — Telephone Encounter (Signed)
Last visit 10-09-2020

## 2021-11-28 NOTE — Progress Notes (Unsigned)
Cardiology Office Note:    Date:  11/29/2021   ID:  [name], DOB [birth year], MRN [id number]  PCP:  Eustaquio Boyden, MD  [facility] Cardiologist:  Yvonne Kendall, MD  [facility] Electrophysiologist:  Lewayne Bunting, MD   Referring MD: Eustaquio Boyden, MD   Chief Complaint: 1 year follow-up  History of Present Illness:    Alvin Phillips is a 41 y.o. male with a hx of Wolff-Parkinson-White syndrome, chronic HFrEF, NICM, palpitations, HTN, DM2, morbid obesity, GERD, OSA, and medication noncompliance who presents for follow-up.   Echo in 2019 showed reduced EF 20-25%. Subsequent R/L heart cath showed normal coronary arteries, severly elevated left and right heart filling pressures, moderately reduced Fick output/index.   The patient is followed by Dr. Ladona Ridgel for WPW. He was previously on sotalol, but now is only on metoprolol. He underwent incomplete ablation 12/2017 of the right mid septal accessory pathway..   Echo 02/2018 showed LVEF 30-35% with global HK, moderately dilated LV with mild LVH, mild LAE, normal RV size and function, mild to moderate pulmonary HTN.   Last seen 10/09/21 and was dong well from a cardiac perspective. Plan was to continue GDMT, but recommended follow-up was not completed.   Today, the patient reports he has overall been doing well from a cardiac perspective. He denies shortness of breath and lower leg edema. He has mid sternal chest pain from time to time that he feels is MSK. He takes lasix only as needed. BP is high, but he has not had his medications today. He says he is fairly active, walking his dogs. He smokes marijuana occasionally. We discussed GDMT for heart failure.    Past Medical History:  Diagnosis Date   CHF (congestive heart failure) (HCC)    Diabetes mellitus without complication (HCC) 07/10/13   referred to DSME, did not show (04/2014)   GERD (gastroesophageal reflux disease)    HFrEF (heart failure with reduced ejection  fraction) (HCC)    a. 11/2017 Echo: EF 20-25%, diff HK;  b. 02/2018 Echo: EF 30-35%, diff HK   Hypertension    Marijuana abuse    Morbidly obese (HCC) 03/24/2014   NICM (nonischemic cardiomyopathy) (HCC)    a. 11/2017 Echo: EF 20-25%, diff HK, mild MR, mildy dil LA, PASP ; b. 11/2017 Cath: nl cors. PCWP . CO/CI 4.8/1.79; c. 02/2018 Echo: EF 30-35%, diff HK, mildly dil LA, nl RV fxn. PASP .   OSA (obstructive sleep apnea) 10/29/2013   Tobacco abuse    Wolff-Parkinson-White (WPW) syndrome    a. 11/2017 Admitted w/ SVT @ 190 bpm; b. 12/2017 EPS w/RFCA: right mid septal accessory pathway <1cm from AV node. Ablation performed but developed recurrent WPW post-procedure and placed on Sotalol.    Past Surgical History:  Procedure Laterality Date   CARDIAC CATHETERIZATION     FRACTURE SURGERY Left 03/21/2000   fractured arm   HERNIA REPAIR  12/16/2013   umbilical   PVC ABLATION N/A 12/22/2017   Procedure: WPW  ABLATION;  Surgeon: Marinus Maw, MD;  Location: [facility] INVASIVE CV LAB;  Service: Cardiovascular;  Laterality: N/A;   RIGHT/LEFT HEART CATH AND CORONARY ANGIOGRAPHY N/A 12/18/2017   Procedure: RIGHT/LEFT HEART CATH AND CORONARY ANGIOGRAPHY;  Surgeon: Iran Ouch, MD;  Location: [facility] INVASIVE CV LAB;  Service: Cardiovascular;  Laterality: N/A;    Current Medications: Current Meds  Medication Sig   Acetaminophen (TYLENOL PO) Take by mouth as needed.   atorvastatin (LIPITOR) 20 MG tablet Take  1 tablet (20 mg total) by mouth daily.   Cyanocobalamin (B-12) 1000 MCG SUBL Place 1 tablet under the tongue daily.   Dulaglutide (TRULICITY) 1.5 MG/0.5ML SOPN Inject 1.5 mg into the skin once a week. Start after the first month of 0.75mg  dose   ENTRESTO 49-51 MG Take 1 tablet by mouth twice daily   furosemide (LASIX) 40 MG tablet Take 40 mg by mouth as needed.   glimepiride (AMARYL) 1 MG tablet Take 2 tablets (2 mg total) by mouth daily with breakfast.   metoprolol succinate  (TOPROL-XL) 50 MG 24 hr tablet Take 1 tablet (50 mg total) by mouth daily.   pantoprazole (PROTONIX) 20 MG tablet TAKE 1 TABLET BY MOUTH ONCE DAILY AS NEEDED FOR  HEARTBURN  OR  INDIGESTION   sildenafil (VIAGRA) 50 MG tablet Take 1 tablet (50 mg total) by mouth daily as needed for erectile dysfunction.     Allergies:   Metformin and related and Sulfa antibiotics   Social History   Socioeconomic History   Marital status: Single    Spouse name: Not on file   Number of children: 2   Years of education: Not on file   Highest education level: Not on file  Occupational History   Occupation: Advertising account executive: SELF-EMPLOYED    Comment: [employer]  Tobacco Use   Smoking status: Former    Packs/day: 0.25    Years: 15.00    Total pack years: 3.75    Types: Cigarettes    Quit date: 2019    Years since quitting: 4.6   Smokeless tobacco: Never  Vaping Use   Vaping Use: Never used  Substance and Sexual Activity   Alcohol use: Yes    Alcohol/week: 0.0 standard drinks of alcohol    Comment: Occassional beer, shots of liquor   Drug use: Yes    Frequency: 2.0 times per week    Types: Marijuana    Comment: 1 time per week; none in 4-6 months.   Sexual activity: Not on file  Other Topics Concern   Not on file  Social History Narrative   Lives with girlfriend Alvin Phillips grew up in [place]. He is currently living in [place]. [name], [place]). He breeds dogs Medical sales representative).   Occ: Teaching laboratory technician rep   Social Determinants of Health   Financial Resource Strain: Not on file  Food Insecurity: Not on file  Transportation Needs: Not on file  Physical Activity: Not on file  Stress: Not on file  Social Connections: Not on file     Family History: The patient's family history includes Diabetes in his maternal grandmother and paternal grandfather; Heart attack (age of onset: 28) in his maternal grandmother.  ROS:    Please see the history of present illness.     All other systems reviewed and are negative.  EKGs/Labs/Other Studies Reviewed:    The following studies were reviewed today:  Echo 02/2018 Study Conclusions   - Left ventricle: The cavity size was moderately dilated. There was    mild concentric hypertrophy. Systolic function was moderately to    severely reduced. The estimated ejection fraction was in the    range of 30% to 35%. Diffuse hypokinesis. Regional wall motion    abnormalities cannot be excluded.  - Left atrium: The atrium was mildly dilated.  - Right ventricle: Systolic function was normal.  - Pulmonary arteries: Systolic pressure was mildly elevated. PA  peak pressure: 44 mm Hg (S).   Cardiac cath 11/2017 Conclusion   1.  Normal coronary arteries. 2.  Severely reduced LV systolic function by echo.  Left ventricular angiography was not performed. 3.  Right heart catheterization showed severely elevated filling pressures with pulmonary capillary wedge pressure of 33 mmHg, moderate to severe pulmonary hypertension and moderately reduced cardiac output at 4.8 with a cardiac index of 1.79.   Recommendations: The patient has nonischemic cardiomyopathy.  He continues to be significantly volume overloaded.  I recommend continuing IV diuresis for at least another day.  I switch losartan to Entresto.  Echo 2019 Study Conclusions   - Left ventricle: The cavity size was severely dilated. There was    mild concentric hypertrophy. Systolic function was severely    reduced. The estimated ejection fraction was in the range of 20%    to 25%. Diffuse hypokinesis. Regional wall motion abnormalities    cannot be excluded.  - Mitral valve: There was mild regurgitation.  - Left atrium: The atrium was mildly dilated.  - Right ventricle: Poorly visualized. Systolic function was mildly    reduced.  - Pulmonary arteries: Systolic pressure was mildly elevated. PA    peak pressure: 36 mm  Hg (S).   EKG:  EKG is ordered today.  The ekg ordered today demonstrates NSR, 1st degree AV block, TWI II, III, V6, nonspecific ST abnormality  Recent Labs: 06/30/2021: ALT 21; BUN 12; Creatinine, Ser 0.88; Potassium 4.0; Sodium 133  Recent Lipid Panel    Component Value Date/Time   CHOL 229 (H) 06/30/2021 0743   TRIG (H) 06/30/2021 0743    916.0 Triglyceride is over 400; calculations on Lipids are invalid.   HDL 29.10 (L) 06/30/2021 0743   CHOLHDL 8 06/30/2021 0743   VLDL 43 (H) 12/17/2017 0535   LDLCALC 93 12/17/2017 0535   LDLDIRECT 84.0 09/16/2021 0834    Physical Exam:    VS:  BP (!) 160/100 (BP Location: Left Arm, Patient Position: Sitting, Cuff Size: Large)   Pulse 80   Ht 5\' 10"  (1.778 m)   Wt (!) 362 lb (164.2 kg)   SpO2 96%   BMI 51.94 kg/m     Wt Readings from Last 3 Encounters:  11/29/21 (!) 362 lb (164.2 kg)  09/15/21 (!) 356 lb 4 oz (161.6 kg)  07/07/21 (!) 356 lb (161.5 kg)     GEN:  Well nourished, well developed in no acute distress HEENT: Normal NECK: No JVD; No carotid bruits LYMPHATICS: No lymphadenopathy CARDIAC: RRR, no murmurs, rubs, gallops RESPIRATORY:  Clear to auscultation without rales, wheezing or rhonchi  ABDOMEN: Soft, non-tender, non-distended MUSCULOSKELETAL:  No edema; No deformity  SKIN: Warm and dry NEUROLOGIC:  Alert and oriented x 3 PSYCHIATRIC:  Normal affect   ASSESSMENT:    1. HFrEF (heart failure with reduced ejection fraction) (HCC)   2. Chronic systolic heart failure (HCC)   3. WPW (Wolff-Parkinson-White syndrome)   4. Essential hypertension   5. Morbid obesity (HCC)    PLAN:    In order of problems listed above:  Chronic HFrEF NICM LVEF 30-35% Follow-up echo in 2019 showed LVEF 30-35%, prior EF 20-25%. Patient has been taking Entresto and Coreg. He saw Dr. 2020 in 2019 to discuss ICD, but he was not taking cardiac medications at that time. Today we discussed heart failure and GDMT in depth. He did not tolerate  Jardiance due to groin rash. I will repeat an echocardiogram today. We will see him back  after this to discuss additional medications and ICD. Patient did not want to make any medication changes today. Continue lasix 40mg  as needed for swelling.   WPW S/p incomplete ablation by Dr. . He was previously on sotolol, now he is only on metoprolol. He denies palpitations. Continue metoprolol 50mg  BID.   HTN BP high, but he has not had his medications today. Continue Entresto and Coreg. Appears PCP started patient on amlodipine. We will await echo results to address this.   Obesity Weight loss encouraged.   Disposition: Follow up in 2 month(s) with MD/APP    Signed, [place] Ladona Ridgel, PA-C  11/29/2021 11:06 AM    [facility]

## 2021-11-29 ENCOUNTER — Ambulatory Visit: Payer: 59 | Attending: Medical | Admitting: Medical

## 2021-11-29 ENCOUNTER — Encounter: Payer: Self-pay | Admitting: Medical

## 2021-11-29 VITALS — BP 160/100 | HR 80 | Ht 70.0 in | Wt 362.0 lb

## 2021-11-29 DIAGNOSIS — I5020 Unspecified systolic (congestive) heart failure: Secondary | ICD-10-CM | POA: Diagnosis not present

## 2021-11-29 DIAGNOSIS — I5022 Chronic systolic (congestive) heart failure: Secondary | ICD-10-CM | POA: Diagnosis not present

## 2021-11-29 DIAGNOSIS — I10 Essential (primary) hypertension: Secondary | ICD-10-CM

## 2021-11-29 DIAGNOSIS — I456 Pre-excitation syndrome: Secondary | ICD-10-CM | POA: Diagnosis not present

## 2021-11-29 NOTE — Patient Instructions (Signed)
Medication Instructions:  Your physician recommends that you continue on your current medications as directed. Please refer to the Current Medication list given to you today.  *If you need a refill on your cardiac medications before your next appointment, please call your pharmacy*   Lab Work: None ordered If you have labs (blood work) drawn today and your tests are completely normal, you will receive your results only by: MyChart Message (if you have MyChart) OR A paper copy in the mail If you have any lab test that is abnormal or we need to change your treatment, we will call you to review the results.   Testing/Procedures: Your physician has requested that you have an echocardiogram. Echocardiography is a painless test that uses sound waves to create images of your heart. It provides your doctor with information about the size and shape of your heart and how well your heart's chambers and valves are working. This procedure takes approximately one hour. There are no restrictions for this procedure.    Follow-Up: At [facility], you and your health needs are our priority.  As part of our continuing mission to provide you with exceptional heart care, we have created designated Provider Care Teams.  These Care Teams include your primary Cardiologist (physician) and Advanced Practice Providers (APPs -  Physician Assistants and Nurse Practitioners) who all work together to provide you with the care you need, when you need it.  We recommend signing up for the patient portal called "MyChart".  Sign up information is provided on this After Visit Summary.  MyChart is used to connect with patients for Virtual Visits (Telemedicine).  Patients are able to view lab/test results, encounter notes, upcoming appointments, etc.  Non-urgent messages can be sent to your provider as well.   To learn more about what you can do with MyChart, go to [email or web address].    Your next appointment:   2  month(s)  The format for your next appointment:   In Person  Provider:   You may see Yvonne Kendall, MD or one of the following Advanced Practice Providers on your designated Care Team:   Nicolasa Ducking, NP Eula Listen, PA-C Cadence Fransico Michael, PA-C Charlsie Quest, NP   Other Instructions N/A  Important Information About Sugar

## 2021-12-09 NOTE — Addendum Note (Signed)
Addended by: James Ivanoff D on: 12/09/2021 03:54 PM   Modules accepted: Orders

## 2021-12-20 ENCOUNTER — Other Ambulatory Visit: Payer: Self-pay | Admitting: Family Medicine

## 2021-12-20 DIAGNOSIS — N529 Male erectile dysfunction, unspecified: Secondary | ICD-10-CM

## 2021-12-20 NOTE — Telephone Encounter (Signed)
Refill request Viagra Last refill 10/05/20  6/6 refills

## 2022-01-12 ENCOUNTER — Telehealth: Payer: Self-pay

## 2022-01-12 NOTE — Telephone Encounter (Signed)
Received fax from cover my meds. Requesting auth for Ozempic. Does not look like patient is currently taking? If so happy to start authorization.   Key: [id number]

## 2022-01-12 NOTE — Telephone Encounter (Addendum)
No, pt is now taking Trulicity.  No PA needed for Ozempic.

## 2022-01-14 ENCOUNTER — Ambulatory Visit: Payer: 59 | Attending: Medical

## 2022-01-14 ENCOUNTER — Telehealth: Payer: Self-pay | Admitting: Internal Medicine

## 2022-01-14 DIAGNOSIS — I5020 Unspecified systolic (congestive) heart failure: Secondary | ICD-10-CM

## 2022-01-14 DIAGNOSIS — N529 Male erectile dysfunction, unspecified: Secondary | ICD-10-CM

## 2022-01-14 LAB — ECHOCARDIOGRAM COMPLETE
AR max vel: 3.71 cm2
AV Area VTI: 3.59 cm2
AV Area mean vel: 3.13 cm2
AV Mean grad: 5 mmHg
AV Peak grad: 8.4 mmHg
Ao pk vel: 1.45 m/s
Area-P 1/2: 4.8 cm2
Calc EF: 44.1 %
S' Lateral: 5.4 cm
Single Plane A2C EF: 40.3 %
Single Plane A4C EF: 46.2 %

## 2022-01-14 MED ORDER — SILDENAFIL CITRATE 50 MG PO TABS: 50.0000 mg | ORAL_TABLET | Freq: Every day | ORAL | 0 refills | Status: DC | PRN

## 2022-01-14 NOTE — Telephone Encounter (Signed)
Noted  

## 2022-01-14 NOTE — Telephone Encounter (Signed)
Patient has been scheduled

## 2022-01-14 NOTE — Telephone Encounter (Signed)
Please review for refill for Dr. Darnell Level

## 2022-01-14 NOTE — Telephone Encounter (Signed)
E-scribed refill  Plz schedule 3 month f/u OV (was due around 12/16/21, per Dr. Darnell Level).

## 2022-01-14 NOTE — Telephone Encounter (Signed)
*  STAT* If patient is at the pharmacy, call can be transferred to refill team.   1. Which medications need to be refilled? (please list name of each medication and dose if known) Sildenafil 50mg  I tablet daily  2. Which pharmacy/location (including street and city if local pharmacy) is medication to be sent to? [facility]  3. Do they need a 30 day or 90 day supply? 6 tablets

## 2022-01-17 ENCOUNTER — Other Ambulatory Visit: Payer: Self-pay | Admitting: Internal Medicine

## 2022-01-21 ENCOUNTER — Ambulatory Visit (INDEPENDENT_AMBULATORY_CARE_PROVIDER_SITE_OTHER): Payer: 59 | Admitting: Family Medicine

## 2022-01-21 ENCOUNTER — Encounter: Payer: Self-pay | Admitting: Family Medicine

## 2022-01-21 VITALS — BP 150/92 | HR 72 | Temp 97.9°F | Ht 70.0 in | Wt 355.2 lb

## 2022-01-21 DIAGNOSIS — I152 Hypertension secondary to endocrine disorders: Secondary | ICD-10-CM

## 2022-01-21 DIAGNOSIS — E1129 Type 2 diabetes mellitus with other diabetic kidney complication: Secondary | ICD-10-CM

## 2022-01-21 DIAGNOSIS — R809 Proteinuria, unspecified: Secondary | ICD-10-CM

## 2022-01-21 DIAGNOSIS — E1159 Type 2 diabetes mellitus with other circulatory complications: Secondary | ICD-10-CM | POA: Diagnosis not present

## 2022-01-21 DIAGNOSIS — I5020 Unspecified systolic (congestive) heart failure: Secondary | ICD-10-CM

## 2022-01-21 DIAGNOSIS — E1169 Type 2 diabetes mellitus with other specified complication: Secondary | ICD-10-CM

## 2022-01-21 LAB — POCT GLYCOSYLATED HEMOGLOBIN (HGB A1C): Hemoglobin A1C: 8.5 % — AB (ref 4.0–5.6)

## 2022-01-21 MED ORDER — TRULICITY 3 MG/0.5ML ~~LOC~~ SOAJ: 3.0000 mg | SUBCUTANEOUS | 11 refills | Status: DC

## 2022-01-21 NOTE — Assessment & Plan Note (Signed)
Appreciate cardiology care.  °

## 2022-01-21 NOTE — Assessment & Plan Note (Signed)
Chronic, improving.  Continue amaryl 2mg  daily, increase trulicity to 3mg  weekly, monitoring for increased side effects.  RTC 3 mo DM f/u visit  I asked he check on preferred glucometer brand.

## 2022-01-21 NOTE — Progress Notes (Signed)
Patient ID: Alvin Phillips, male    DOB: Aug 12, 1980, 41 y.o.   MRN: [id number]  This visit was conducted in person.  BP (!) 150/92   Pulse 72   Temp 97.9 F (36.6 C) (Temporal)   Ht 5\' 10"  (1.778 m)   Wt (!) 355 lb 4 oz (161.1 kg)   SpO2 97%   BMI 50.97 kg/m    CC: 3 mo f/u visit  Subjective:   HPI: Alvin Phillips is a 41 y.o. male presenting on 01/21/2022 for Follow-up (Here for 3 mo f/u.)   Just found out he may need dental work - for cracked tooth, recommended root canal.  Financial stressors.  ED - notes ongoing ED despite sildenafil 50mg  daily. Would like to try 100mg  dose.   Saw cardiology 11/2021 for known chronic HFrEF, NICM EF 30-35%, WPW syndrome and hypertension - continues entresto and Toprol XL as well as amlodipine daily. BP elevated today - hasn't taken meds yet today. He's only taking entresto once daily (in am)  DM - does not regularly check sugars. Compliant with antihyperglycemic regimen which includes: trulicity 1.5mg  weekly (started ), amaryl 2mg  daily. H/o metformin intolerance. Denies low sugars or hypoglycemic symptoms. Denies paresthesias, blurry vision. Last diabetic eye exam 09/2021. Glucometer brand: doesn't have one. Last foot exam: 08/2021. DSME: never completed. Lab Results  Component Value Date   HGBA1C 8.5 (A) 01/21/2022  Last fructosamine - 434 (A1c 10.9%) 08/2021 Diabetic Foot Exam - Simple   No data filed    Lab Results  Component Value Date   MICROALBUR 7.0 (H) 06/30/2021         Relevant past medical, surgical, family and social history reviewed and updated as indicated. Interim medical history since our last visit reviewed. Allergies and medications reviewed and updated. Outpatient Medications Prior to Visit  Medication Sig Dispense Refill   amLODipine (NORVASC) 5 MG tablet Take 1 tablet (5 mg total) by mouth daily. 30 tablet 11   atorvastatin (LIPITOR) 20 MG tablet Take 1 tablet (20 mg total) by mouth daily. 90 tablet 3    Cyanocobalamin (B-12) 1000 MCG SUBL Place 1 tablet under the tongue daily.     ENTRESTO 49-51 MG Take 1 tablet by mouth twice daily 60 tablet 0   furosemide (LASIX) 40 MG tablet Take 40 mg by mouth as needed.     glimepiride (AMARYL) 1 MG tablet Take 2 tablets (2 mg total) by mouth daily with breakfast. 90 tablet 3   glucose blood (BAYER CONTOUR NEXT TEST) test strip Use as instructed 100 each 5   Lancets (ONETOUCH ULTRASOFT) lancets Use as instructed 100 each 5   metoprolol succinate (TOPROL-XL) 50 MG 24 hr tablet Take 1 tablet (50 mg total) by mouth daily. 90 tablet 3   pantoprazole (PROTONIX) 20 MG tablet TAKE 1 TABLET BY MOUTH ONCE DAILY AS NEEDED FOR  HEARTBURN  OR  INDIGESTION 30 tablet 5   sildenafil (VIAGRA) 50 MG tablet Take 1 tablet (50 mg total) by mouth daily as needed for erectile dysfunction. 6 tablet 0   Dulaglutide (TRULICITY) 1.5 MG/0.5ML SOPN Inject 1.5 mg into the skin once a week. Start after the first month of 0.75mg  dose 2 mL 3   Acetaminophen (TYLENOL PO) Take by mouth as needed.     No facility-administered medications prior to visit.     Per HPI unless specifically indicated in ROS section below Review of Systems  Objective:  BP (!) 150/92   Pulse  72   Temp 97.9 F (36.6 C) (Temporal)   Ht 5\' 10"  (1.778 m)   Wt (!) 355 lb 4 oz (161.1 kg)   SpO2 97%   BMI 50.97 kg/m   Wt Readings from Last 3 Encounters:  01/21/22 (!) 355 lb 4 oz (161.1 kg)  11/29/21 (!) 362 lb (164.2 kg)  09/15/21 (!) 356 lb 4 oz (161.6 kg)      Physical Exam Vitals and nursing note reviewed.  Constitutional:      Appearance: Normal appearance. He is obese. He is not ill-appearing.  Eyes:     Extraocular Movements: Extraocular movements intact.     Conjunctiva/sclera: Conjunctivae normal.     Pupils: Pupils are equal, round, and reactive to light.  Cardiovascular:     Rate and Rhythm: Normal rate and regular rhythm.     Pulses: Normal pulses.     Heart sounds: Normal heart sounds.  No murmur heard. Pulmonary:     Effort: Pulmonary effort is normal. No respiratory distress.     Breath sounds: Normal breath sounds. No wheezing, rhonchi or rales.  Musculoskeletal:     Right lower leg: No edema.     Left lower leg: No edema.     Comments: See HPI for foot exam if done  Skin:    General: Skin is warm and dry.     Findings: No rash.  Neurological:     Mental Status: He is alert.  Psychiatric:        Mood and Affect: Mood normal.        Behavior: Behavior normal.       Results for orders placed or performed in visit on 01/21/22  POCT glycosylated hemoglobin (Hb A1C)  Result Value Ref Range   Hemoglobin A1C 8.5 (A) 4.0 - 5.6 %   HbA1c POC (<> result, manual entry)     HbA1c, POC (prediabetic range)     HbA1c, POC (controlled diabetic range)      Assessment & Plan:   Problem List Items Addressed This Visit     Type 2 diabetes mellitus with other specified complication (HCC) - Primary    Chronic, improving.  Continue amaryl 2mg  daily, increase trulicity to 3mg  weekly, monitoring for increased side effects.  RTC 3 mo DM f/u visit  I asked he check on preferred glucometer brand.      Relevant Medications   Dulaglutide (TRULICITY) 3 MG/0.5ML SOPN   Other Relevant Orders   POCT glycosylated hemoglobin (Hb A1C) (Completed)   Hypertension associated with type 2 diabetes mellitus (HCC)    BP elevated - he did miss am BP meds today.  Continue current regimen, encouraged compliance with medications.       Relevant Medications   Dulaglutide (TRULICITY) 3 MG/0.5ML SOPN   HFrEF (heart failure with reduced ejection fraction) (HCC)    Appreciate cardiology care.       Type 2 diabetes mellitus with microalbuminuria, without long-term current use of insulin (HCC)   Relevant Medications   Dulaglutide (TRULICITY) 3 MG/0.5ML SOPN     Meds ordered this encounter  Medications   Dulaglutide (TRULICITY) 3 MG/0.5ML SOPN    Sig: Inject 3 mg as directed once a week.     Dispense:  2 mL    Refill:  11    Note new dose   Orders Placed This Encounter  Procedures   POCT glycosylated hemoglobin (Hb A1C)    Patient Instructions  Check with insurance or pharmacy on preferred glucose  meter and let me know.  May try viagra 1.5 or 2 pills at a time (max 100mg  daily).  Continue current medicines, increase trulicity to 3mg  weekly - new dose sent to pharmacy, start after you run out of current 1.5mg  pens.  Return in 3 months for diabetes follow up visit.   Follow up plan: Return in about 3 months (around 04/23/2022) for follow up visit.  Ria Bush, MD

## 2022-01-21 NOTE — Assessment & Plan Note (Signed)
BP elevated - he did miss am BP meds today.  Continue current regimen, encouraged compliance with medications.

## 2022-01-21 NOTE — Patient Instructions (Addendum)
Check with insurance or pharmacy on preferred glucose meter and let me know.  May try viagra 1.5 or 2 pills at a time (max 100mg  daily).  Continue current medicines, increase trulicity to 3mg  weekly - new dose sent to pharmacy, start after you run out of current 1.5mg  pens.  Return in 3 months for diabetes follow up visit.

## 2022-02-02 ENCOUNTER — Encounter: Payer: Self-pay | Admitting: Internal Medicine

## 2022-02-02 ENCOUNTER — Ambulatory Visit: Payer: 59 | Attending: Internal Medicine | Admitting: Internal Medicine

## 2022-02-02 ENCOUNTER — Other Ambulatory Visit: Payer: Self-pay | Admitting: Family Medicine

## 2022-02-02 VITALS — BP 152/96 | HR 72 | Ht 70.0 in | Wt 350.0 lb

## 2022-02-02 DIAGNOSIS — I428 Other cardiomyopathies: Secondary | ICD-10-CM

## 2022-02-02 DIAGNOSIS — E1169 Type 2 diabetes mellitus with other specified complication: Secondary | ICD-10-CM

## 2022-02-02 DIAGNOSIS — I456 Pre-excitation syndrome: Secondary | ICD-10-CM

## 2022-02-02 DIAGNOSIS — I5022 Chronic systolic (congestive) heart failure: Secondary | ICD-10-CM | POA: Diagnosis not present

## 2022-02-02 DIAGNOSIS — E785 Hyperlipidemia, unspecified: Secondary | ICD-10-CM | POA: Diagnosis not present

## 2022-02-02 MED ORDER — ENTRESTO 97-103 MG PO TABS: 1.0000 | ORAL_TABLET | Freq: Two times a day (BID) | ORAL | 11 refills | Status: DC

## 2022-02-02 NOTE — Progress Notes (Signed)
Ordering ferritin and BMP per cardiology request.  Will also order BMP to be completed in 2 weeks.

## 2022-02-02 NOTE — Progress Notes (Signed)
Follow-up Outpatient Visit Date: 02/02/2022  Primary Care Provider: Eustaquio Boyden, MD [address] [place] [address]  Chief Complaint: Follow-up HFrEF  HPI:  [name] is a 41 y.o. male with history of Wolff-Parkinson-White syndrome, chronic HFrEF due to nonischemic cardiomyopathy, palpitations, hypertension, uncontrolled diabetes mellitus, morbid obesity, GERD, and sleep apnea, who presents for follow-up of nonischemic cardiomyopathy and WPW.  He was last seen in our office in September by Cadence Further, PA, after having been lost to follow-up for over a year.  At that time, he was feeling well other than occasional mid-sternal chest pain felt to be musculoskeletal in nature.  Repeat echocardiogram last month showed slight improved in LV systolic function, with EF 35-40%.  Today, Mr. [name] reports that he has continued to feel well.  He denies chest pain, shortness of breath, palpitations, and lightheadedness.  He has rare edema and has only needed to use as needed furosemide a few times a year.  He is tolerating his medications well.  --------------------------------------------------------------------------------------------------  Cardiovascular History & Procedures: Cardiovascular Problems: Chest pain Shortness of breath WPW   Risk Factors: Hypertension, diabetes mellitus, male gender, and morbid obesity   Cath/PCI: R/LHC (12/18/2017): Normal coronary arteries.  Severely elevated left and right heart filling pressures.  Moderately reduced Fick cardiac output/index.   CV Surgery: None   EP Procedures and Devices: EPS/ablation (12/22/2017, Dr. 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   Non-Invasive Evaluation(s): TTE (01/14/2022): Moderately dilated LV with LVEF 35-40% with global hypokinesis and grade 2 diastolic dysfunction (GLS -11.8%).  Normal RV size and function.   Moderate left atrial enlargement.  Mild MR. TTE (02/22/2018): Moderately dilated LV with mild LVH.  LVEF 30-35% with global hypokinesis.  Mild LAE.  Normal RV size and function.  Mild to moderate pulmonary hypertension. TTE (12/15/2017): Severely dilated LV with mild LVH.  LVEF 20-25% with global hypokinesis.  Mild MR.  Mild LAE.  Mildly reduced RV function.  Mild pulmonary hypertension.  Recent CV Pertinent Labs: Lab Results  Component Value Date   CHOL 229 (H) 06/30/2021   HDL 29.10 (L) 06/30/2021   LDLCALC 93 12/17/2017   LDLDIRECT 84.0 09/16/2021   TRIG (H) 06/30/2021    916.0 Triglyceride is over 400; calculations on Lipids are invalid.   CHOLHDL 8 06/30/2021   INR 0.97 12/17/2017   BNP 234.0 (H) 12/14/2017   K 4.0 06/30/2021   K 3.6 12/10/2013   MG 1.8 12/25/2017   BUN 12 06/30/2021   BUN 16 10/27/2020   BUN 11 12/10/2013   CREATININE 0.88 06/30/2021   CREATININE 0.97 12/10/2013    Past medical and surgical history were reviewed and updated in EPIC.  Current Meds  Medication Sig   amLODipine (NORVASC) 5 MG tablet Take 1 tablet (5 mg total) by mouth daily.   atorvastatin (LIPITOR) 20 MG tablet Take 1 tablet (20 mg total) by mouth daily.   Cyanocobalamin (B-12) 1000 MCG SUBL Place 1 tablet under the tongue daily.   Dulaglutide (TRULICITY) 3 MG/0.5ML SOPN Inject 3 mg as directed once a week.   ENTRESTO 49-51 MG Take 1 tablet by mouth twice daily   furosemide (LASIX) 40 MG tablet Take 40 mg by mouth as needed.   glimepiride (AMARYL) 1 MG tablet Take 2 tablets (2 mg total) by mouth daily with breakfast.   glucose blood (BAYER CONTOUR NEXT TEST) test strip Use as instructed   Lancets (  ONETOUCH ULTRASOFT) lancets Use as instructed   metoprolol succinate (TOPROL-XL) 50 MG 24 hr tablet Take 1 tablet (50 mg total) by mouth daily.   pantoprazole (PROTONIX) 20 MG tablet TAKE 1 TABLET BY MOUTH ONCE DAILY AS NEEDED FOR  HEARTBURN  OR  INDIGESTION   sildenafil (VIAGRA) 50 MG tablet Take  1 tablet (50 mg total) by mouth daily as needed for erectile dysfunction.    Allergies: Metformin and related and Sulfa antibiotics  Social History   Tobacco Use   Smoking status: Former    Packs/day: 0.25    Years: 15.00    Total pack years: 3.75    Types: Cigarettes    Quit date: 2019    Years since quitting: 4.8   Smokeless tobacco: Never  Vaping Use   Vaping Use: Never used  Substance Use Topics   Alcohol use: Yes    Alcohol/week: 0.0 standard drinks of alcohol    Comment: Occassional beer, shots of liquor   Drug use: Yes    Frequency: 2.0 times per week    Types: Marijuana    Comment: 1 time per week; none in 4-6 months.    Family History  Problem Relation Age of Onset   Diabetes Maternal Grandmother    Heart attack Maternal Grandmother 61   Diabetes Paternal Grandfather     Review of Systems: A 12-system review of systems was performed and was negative except as noted in the HPI.  --------------------------------------------------------------------------------------------------  Physical Exam: BP (!) 152/96 (BP Location: Left Arm, Patient Position: Sitting, Cuff Size: Large) Comment: has not takens meds yet this morning  Pulse 72   Ht 5\' 10"  (1.778 m)   Wt (!) 350 lb (158.8 kg)   SpO2 99%   BMI 50.22 kg/m   General:  NAD. Neck: No JVD or HJR, though body habitus limits evaluation. Lungs: Clear to auscultation bilaterally without wheezes or crackles. Heart: Distant heart sounds.  Regular rate and rhythm without murmurs, rubs, or gallops. Abdomen: Soft, nontender, nondistended. Extremities: No lower extremity edema.  Lab Results  Component Value Date   WBC 7.4 09/28/2020   HGB 13.5 09/28/2020   HCT 40.5 09/28/2020   MCV 88.2 09/28/2020   PLT 377 09/28/2020    Lab Results  Component Value Date   NA 133 (L) 06/30/2021   K 4.0 06/30/2021   CL 97 06/30/2021   CO2 27 06/30/2021   BUN 12 06/30/2021   CREATININE 0.88 06/30/2021   GLUCOSE 391 (H)  06/30/2021   ALT 21 06/30/2021    Lab Results  Component Value Date   CHOL 229 (H) 06/30/2021   HDL 29.10 (L) 06/30/2021   LDLCALC 93 12/17/2017   LDLDIRECT 84.0 09/16/2021   TRIG (H) 06/30/2021    916.0 Triglyceride is over 400; calculations on Lipids are invalid.   CHOLHDL 8 06/30/2021    --------------------------------------------------------------------------------------------------  ASSESSMENT AND PLAN: Chronic HFrEF due to nonischemic cardiomyopathy: Mr. Nunn appears euvolemic though body habitus makes evaluation difficult.  He does not have any significant symptoms consistent with NYHA class I heart failure.  Echocardiogram last month showed slight improvement in LVEF to 35-40%.  Given his persistently elevated blood pressure, we have agreed to increase Entresto to 97-103 mg twice daily.  I will check a BMP tomorrow through his PCP at Mr. Shinsato request as well as a ferritin to exclude of hemochromatosis underlying his cardiomyopathy.  We will also repeat a BMP in about 2 weeks to ensure stable renal function and potassium.  Once maximized on Entresto, addition of an SGLT2 inhibitor and judicious escalation of metoprolol will need to be considered.  He did not tolerate empagliflozin in the past due to balanitis.  WPW: No palpitations reported.  Continue current dose of metoprolol.  Hyperlipidemia associated with type 2 diabetes mellitus: Continue rosuvastatin and DM therapy per Dr. Sharen Hones.  Morbid obesity: BMI remains greater than 50 though weight is gradually trending down.  I have encouraged Mr. Foti to keep working on weight loss through diet and exercise.  Follow-up: Return to clinic in 3 months.  Yvonne Kendall, MD 02/02/2022 9:30 AM

## 2022-02-02 NOTE — Patient Instructions (Signed)
Medication Instructions:   Your physician has recommended you make the following change in your medication:    INCREASE your Entresto to 97-103 MG twice a day. (You can double up on your 49-51 MG tablets until you run out).   *If you need a refill on your cardiac medications before your next appointment, please call your pharmacy*   Lab Work:   Your physician recommends that you return for lab work Julianne Rice, and a BMP) in: Tomorrow in AM.   2.   Your physician recommends that you return for lab work (BMP) in: In 2 weeks    Follow-Up: At [facility], you and your health needs are our priority.  As part of our continuing mission to provide you with exceptional heart care, we have created designated Provider Care Teams.  These Care Teams include your primary Cardiologist (physician) and Advanced Practice Providers (APPs -  Physician Assistants and Nurse Practitioners) who all work together to provide you with the care you need, when you need it.  We recommend signing up for the patient portal called "MyChart".  Sign up information is provided on this After Visit Summary.  MyChart is used to connect with patients for Virtual Visits (Telemedicine).  Patients are able to view lab/test results, encounter notes, upcoming appointments, etc.  Non-urgent messages can be sent to your provider as well.   To learn more about what you can do with MyChart, go to [email or web address].    Your next appointment:   3 month(s)  The format for your next appointment:   In Person  Provider:   Yvonne Kendall, MD    Other Instructions   Important Information About Sugar

## 2022-02-18 ENCOUNTER — Other Ambulatory Visit: Payer: Self-pay | Admitting: Family Medicine

## 2022-02-18 DIAGNOSIS — N529 Male erectile dysfunction, unspecified: Secondary | ICD-10-CM

## 2022-04-25 ENCOUNTER — Ambulatory Visit (INDEPENDENT_AMBULATORY_CARE_PROVIDER_SITE_OTHER): Payer: 59 | Admitting: Family Medicine

## 2022-04-25 ENCOUNTER — Encounter: Payer: Self-pay | Admitting: Family Medicine

## 2022-04-25 VITALS — BP 150/88 | HR 92 | Temp 97.1°F | Ht 70.0 in | Wt 360.2 lb

## 2022-04-25 DIAGNOSIS — N529 Male erectile dysfunction, unspecified: Secondary | ICD-10-CM | POA: Diagnosis not present

## 2022-04-25 DIAGNOSIS — E1129 Type 2 diabetes mellitus with other diabetic kidney complication: Secondary | ICD-10-CM | POA: Diagnosis not present

## 2022-04-25 DIAGNOSIS — I5020 Unspecified systolic (congestive) heart failure: Secondary | ICD-10-CM

## 2022-04-25 DIAGNOSIS — I428 Other cardiomyopathies: Secondary | ICD-10-CM

## 2022-04-25 DIAGNOSIS — R809 Proteinuria, unspecified: Secondary | ICD-10-CM | POA: Diagnosis not present

## 2022-04-25 DIAGNOSIS — I152 Hypertension secondary to endocrine disorders: Secondary | ICD-10-CM

## 2022-04-25 DIAGNOSIS — E1169 Type 2 diabetes mellitus with other specified complication: Secondary | ICD-10-CM

## 2022-04-25 DIAGNOSIS — E1159 Type 2 diabetes mellitus with other circulatory complications: Secondary | ICD-10-CM

## 2022-04-25 LAB — BASIC METABOLIC PANEL
BUN: 12 mg/dL (ref 6–23)
CO2: 29 mEq/L (ref 19–32)
Calcium: 8.9 mg/dL (ref 8.4–10.5)
Chloride: 96 mEq/L (ref 96–112)
Creatinine, Ser: 0.84 mg/dL (ref 0.40–1.50)
GFR: 107.19 mL/min (ref >60.00)
Glucose, Bld: 414 mg/dL — ABNORMAL HIGH (ref 70–99)
Potassium: 4.3 mEq/L (ref 3.5–5.1)
Sodium: 135 mEq/L (ref 135–145)

## 2022-04-25 LAB — POCT GLYCOSYLATED HEMOGLOBIN (HGB A1C): Hemoglobin A1C: 10.3 % — AB (ref 4.0–5.6)

## 2022-04-25 LAB — FERRITIN: Ferritin: 175.9 ng/mL (ref 22.0–322.0)

## 2022-04-25 MED ORDER — DAPAGLIFLOZIN PROPANEDIOL 5 MG PO TABS: 5.0000 mg | ORAL_TABLET | Freq: Every day | ORAL | 6 refills | Status: DC

## 2022-04-25 MED ORDER — SILDENAFIL CITRATE 100 MG PO TABS: 100.0000 mg | ORAL_TABLET | Freq: Every day | ORAL | 3 refills | Status: DC | PRN

## 2022-04-25 NOTE — Assessment & Plan Note (Addendum)
Weight gain noted.  Rec restart trulicity 3mg  weekly - he's currently out of med.

## 2022-04-25 NOTE — Assessment & Plan Note (Addendum)
Chronic, deteriorated control which patient attributes to dietary liberties during the holiday season. Encouraged renewed efforts to improve sugar control. Continue trulicity 3mg  weekly, amaryl 2mg  daily, add farxiga 5mg  daily, reviewing side effects/adverse effects to monitor for.  RTC 3 mo DM f/u visit.  Discussed need to monitor for recurrent groin abscess.

## 2022-04-25 NOTE — Progress Notes (Signed)
Patient ID: Alvin Phillips, male    DOB: November 26, 1980, 42 y.o.   MRN: [id number]  This visit was conducted in person.  BP (!) 150/88   Pulse 92   Temp (!) 97.1 F (36.2 C) (Temporal)   Ht 5\' 10"  (1.778 m)   Wt (!) 360 lb 4 oz (163.4 kg)   SpO2 97%   BMI 51.69 kg/m   BP Readings from Last 3 Encounters:  04/25/22 (!) 150/88  02/02/22 (!) 152/96  01/21/22 (!) 150/92    CC: DM f/u visit  Subjective:   HPI: Alvin Phillips is a 42 y.o. male presenting on 04/25/2022 for Medical Management of Chronic Issues (Here for 3 mo DM f/u. Also, wants to discuss pantoprazole. )   Worried about daughter's upcoming trip to [place].  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  Has [employer].   2-3 wks ago felt ill for 1 week with cough sinus congestion and ear pain, symptoms have fully resolved.   Seeing cardiology regularly, now on Entresto 97/103mg  BID. H/o chronic HFrEF, NICM EF 30-35%, WPW, HTN.   Hasn't taking BP meds yet today but he already had breakfast.  No h/o recurrent UTI, yeast infection, groin cellulitis or other skin infection.   DM - does not regularly check sugars. Compliant with antihyperglycemic regimen which includes: trulicity 3mg  weekly, amaryl 2mg  daily. Tolerating med overall ok. Notes holiday season has had poor diet. Ran out of trulicity this past week, planning to refilled. H/o metformin intolerance. Notes some cramping to fingers. Denies low sugars or hypoglycemic symptoms. Denies paresthesias, blurry vision. Last diabetic eye exam 09/2021. Glucometer brand: doesn't have one. Last foot exam: 08/2021. DSME: has not completed.  Lab Results  Component Value Date   HGBA1C 10.3 (A) 04/25/2022   Diabetic Foot Exam - Simple   Simple Foot Form Diabetic Foot exam was performed with the following findings: Yes 04/25/2022 10:34 AM  Visual Inspection No deformities, no ulcerations, no other skin breakdown  bilaterally: Yes Sensation Testing Intact to touch and monofilament testing bilaterally: Yes Pulse Check Posterior Tibialis and Dorsalis pulse intact bilaterally: Yes Comments    Lab Results  Component Value Date   MICROALBUR 7.0 (H) 06/30/2021         Relevant past medical, surgical, family and social history reviewed and updated as indicated. Interim medical history since our last visit reviewed. Allergies and medications reviewed and updated. Outpatient Medications Prior to Visit  Medication Sig Dispense Refill   amLODipine (NORVASC) 5 MG tablet Take 1 tablet (5 mg total) by mouth daily. 30 tablet 11   atorvastatin (LIPITOR) 20 MG tablet Take 1 tablet (20 mg total) by mouth daily. 90 tablet 3   Cyanocobalamin (B-12) 1000 MCG SUBL Place 1 tablet under the tongue daily.     Dulaglutide (TRULICITY) 3 ZH/0.8MV SOPN Inject 3 mg as directed once a week. 2 mL 11   furosemide (LASIX) 40 MG tablet Take 40 mg by mouth as needed.     glimepiride (AMARYL) 1 MG tablet Take 2 tablets (2 mg total) by mouth daily with breakfast. 90 tablet 3   glucose blood (BAYER CONTOUR NEXT TEST) test strip Use as instructed 100 each 5   Lancets (ONETOUCH ULTRASOFT) lancets Use as instructed 100 each 5   metoprolol succinate (TOPROL-XL) 50 MG 24 hr tablet Take 1 tablet (50 mg total) by mouth daily. 90 tablet 3  pantoprazole (PROTONIX) 20 MG tablet TAKE 1 TABLET BY MOUTH ONCE DAILY AS NEEDED FOR  HEARTBURN  OR  INDIGESTION 30 tablet 5   sacubitril-valsartan (ENTRESTO) 97-103 MG Take 1 tablet by mouth 2 (two) times daily. 60 tablet 11   sildenafil (VIAGRA) 50 MG tablet TAKE 1 TABLET BY MOUTH ONCE DAILY AS NEEDED FOR ERECTILE DYSFUNCTION 6 tablet 3   No facility-administered medications prior to visit.     Per HPI unless specifically indicated in ROS section below Review of Systems  Objective:  BP (!) 150/88   Pulse 92   Temp (!) 97.1 F (36.2 C) (Temporal)   Ht 5\' 10"  (1.778 m)   Wt (!) 360 lb 4 oz  (163.4 kg)   SpO2 97%   BMI 51.69 kg/m   Wt Readings from Last 3 Encounters:  04/25/22 (!) 360 lb 4 oz (163.4 kg)  02/02/22 (!) 350 lb (158.8 kg)  01/21/22 (!) 355 lb 4 oz (161.1 kg)      Physical Exam Vitals and nursing note reviewed.  Constitutional:      Appearance: Normal appearance. He is obese. He is not ill-appearing.  Eyes:     Extraocular Movements: Extraocular movements intact.     Conjunctiva/sclera: Conjunctivae normal.     Pupils: Pupils are equal, round, and reactive to light.  Cardiovascular:     Rate and Rhythm: Normal rate and regular rhythm.     Pulses: Normal pulses.     Heart sounds: Normal heart sounds. No murmur heard. Pulmonary:     Effort: Pulmonary effort is normal. No respiratory distress.     Breath sounds: Normal breath sounds. No wheezing, rhonchi or rales.  Musculoskeletal:     Right lower leg: No edema.     Left lower leg: No edema.     Comments: See HPI for foot exam if done  Skin:    General: Skin is warm and dry.     Findings: No rash.  Neurological:     Mental Status: He is alert.  Psychiatric:        Mood and Affect: Mood normal.        Behavior: Behavior normal.       Results for orders placed or performed in visit on 04/25/22  POCT glycosylated hemoglobin (Hb A1C)  Result Value Ref Range   Hemoglobin A1C 10.3 (A) 4.0 - 5.6 %   HbA1c POC (<> result, manual entry)     HbA1c, POC (prediabetic range)     HbA1c, POC (controlled diabetic range)      Assessment & Plan:   Problem List Items Addressed This Visit     Type 2 diabetes mellitus with other specified complication ([place]) - Primary    Chronic, deteriorated control which patient attributes to dietary liberties during the holiday season. Encouraged renewed efforts to improve sugar control. Continue trulicity 3mg  weekly, amaryl 2mg  daily, add farxiga 5mg  daily, reviewing side effects/adverse effects to monitor for.  RTC 3 mo DM f/u visit.  Discussed need to monitor for  recurrent groin abscess.       Relevant Medications   dapagliflozin propanediol (FARXIGA) 5 MG TABS tablet   Obesity, morbid, BMI 50 or higher ([place])    Weight gain noted.  Rec restart trulicity 3mg  weekly - he's currently out of med.       Relevant Medications   dapagliflozin propanediol (FARXIGA) 5 MG TABS tablet   Hypertension associated with type 2 diabetes mellitus (HCC)    Chronic, uncontrolled. He  has not yet taking BP meds this morning yet.  Add farxiga as per below.       Relevant Medications   sildenafil (VIAGRA) 100 MG tablet   dapagliflozin propanediol (FARXIGA) 5 MG TABS tablet   HFrEF (heart failure with reduced ejection fraction) ([place])    Appreciate cardiology care.  Keep f/u next week.  Check BMP, ferritin today to eval for iron overload condition.       Relevant Medications   sildenafil (VIAGRA) 100 MG tablet   Nonischemic cardiomyopathy (HCC)   Relevant Medications   sildenafil (VIAGRA) 100 MG tablet   Erectile dysfunction   Relevant Medications   sildenafil (VIAGRA) 100 MG tablet   Type 2 diabetes mellitus with microalbuminuria, without long-term current use of insulin (HCC)   Relevant Medications   dapagliflozin propanediol (FARXIGA) 5 MG TABS tablet   Other Relevant Orders   POCT glycosylated hemoglobin (Hb A1C) (Completed)     Meds ordered this encounter  Medications   sildenafil (VIAGRA) 100 MG tablet    Sig: Take 1 tablet (100 mg total) by mouth daily as needed for erectile dysfunction.    Dispense:  6 tablet    Refill:  3   dapagliflozin propanediol (FARXIGA) 5 MG TABS tablet    Sig: Take 1 tablet (5 mg total) by mouth daily before breakfast.    Dispense:  30 tablet    Refill:  6    Orders Placed This Encounter  Procedures   POCT glycosylated hemoglobin (Hb A1C)    Patient Instructions  Continue current medicines, work on diet. Start farxiga 5mg  daily. Watch for UTI or yeast infection or groin infection symptoms and let us know if  that develops.  Return in 3 months for physical and diabetes follow up visit Happy early Birthday!  Follow up plan: Return in about 3 months (around 07/24/2022) for annual exam, prior fasting for blood work.  Ria Bush, MD

## 2022-04-25 NOTE — Assessment & Plan Note (Signed)
Chronic, uncontrolled. He has not yet taking BP meds this morning yet.  Add farxiga as per below.

## 2022-04-25 NOTE — Assessment & Plan Note (Signed)
Appreciate cardiology care.  Keep f/u next week.  Check BMP, ferritin today to eval for iron overload condition.

## 2022-04-25 NOTE — Patient Instructions (Addendum)
Continue current medicines, work on diet. Start farxiga 5mg  daily. Watch for UTI or yeast infection or groin infection symptoms and let us know if that develops.  Return in 3 months for physical and diabetes follow up visit Happy early Birthday!

## 2022-05-04 NOTE — Progress Notes (Unsigned)
Follow-up Outpatient Visit Date: 05/05/2022  Primary Care Provider: Ria Bush, MD [place] [address]  Chief Complaint: Follow-up nonischemic cardiomyopathy and Wolff-Parkinson-White syndrome.  HPI:  Alvin Phillips is a 42 y.o. male with history of Wolff-Parkinson-White syndrome, chronic HFrEF due to nonischemic cardiomyopathy, palpitations, hypertension, uncontrolled diabetes mellitus, morbid obesity, GERD, and sleep apnea, who presents for follow-up of nonischemic cardiomyopathy and WPW.  I last saw him in 01/2022, at which time he was feeling well.  We did not make any medication changes or pursue additional testing at our last visit.  Earlier this month, he was started on dapagliflozin 5 mg daily by Dr. Danise Mina (of note, Alvin Phillips was intolerant of empagliflozin in the past due to balanitis).  Today, Alvin Phillips reports that he has been feeling fairly well.  He has not yet started the dapagliflozin that was prescribed by Dr. Danise Mina.  He notes that he had been off of his medications a few weeks ago due to some insurance issues, though he is now back on his regimen.  He denies chest pain, shortness of breath, palpitations, lightheadedness, and edema.  He walks a lot at work but does not exercise regularly.  He was diagnosed with sleep apnea around 2016 but has never worn a CPAP.  --------------------------------------------------------------------------------------------------  Cardiovascular History & Procedures: Cardiovascular Problems: Chronic HFrEF due to nonischemic cardiomyopathy WPW   Risk Factors: Hypertension, diabetes mellitus, male gender, and morbid obesity   Cath/PCI: R/LHC (12/18/2017): Normal coronary arteries.  Severely elevated left and right heart filling pressures.  Moderately reduced Fick cardiac output/index.   CV Surgery: None   EP Procedures and Devices: EPS/ablation (12/22/2017, Dr. Lovena Le): Successful ablation of right mid  septal accessory pathway (evidence of recurrence the next day; placed on sotalol).   Non-Invasive Evaluation(s): TTE (01/14/2022): Moderately dilated LV with LVEF 35-40% with global hypokinesis and grade 2 diastolic dysfunction (GLS -11.8%).  Normal RV size and function.  Moderate left atrial enlargement.  Mild MR. TTE (02/22/2018): Moderately dilated LV with mild LVH.  LVEF 30-35% with global hypokinesis.  Mild LAE.  Normal RV size and function.  Mild to moderate pulmonary hypertension. TTE (12/15/2017): Severely dilated LV with mild LVH.  LVEF 20-25% with global hypokinesis.  Mild MR.  Mild LAE.  Mildly reduced RV function.  Mild pulmonary hypertension.  Recent CV Pertinent Labs: Lab Results  Component Value Date   CHOL 229 (H) 06/30/2021   HDL 29.10 (L) 06/30/2021   LDLCALC 93 12/17/2017   LDLDIRECT 84.0 09/16/2021   TRIG (H) 06/30/2021    916.0 Triglyceride is over 400; calculations on Lipids are invalid.   CHOLHDL 8 06/30/2021   INR 0.97 12/17/2017   BNP 234.0 (H) 12/14/2017   K 4.3 04/25/2022   K 3.6 12/10/2013   MG 1.8 12/25/2017   BUN 12 04/25/2022   BUN 16 10/27/2020   BUN 11 12/10/2013   CREATININE 0.84 04/25/2022   CREATININE 0.97 12/10/2013    Past medical and surgical history were reviewed and updated in EPIC.  Current Meds  Medication Sig   amLODipine (NORVASC) 5 MG tablet Take 1 tablet (5 mg total) by mouth daily.   atorvastatin (LIPITOR) 20 MG tablet Take 1 tablet (20 mg total) by mouth daily.   Cyanocobalamin (B-12) 1000 MCG SUBL Place 1 tablet under the tongue daily.   dapagliflozin propanediol (FARXIGA) 5 MG TABS tablet Take 1 tablet (5 mg total) by mouth daily before breakfast.   Dulaglutide (TRULICITY) 3 0000000  SOPN Inject 3 mg as directed once a week.   furosemide (LASIX) 40 MG tablet Take 40 mg by mouth as needed.   glimepiride (AMARYL) 1 MG tablet Take 2 tablets (2 mg total) by mouth daily with breakfast.   glucose blood (BAYER CONTOUR NEXT TEST) test  strip Use as instructed   Lancets (ONETOUCH ULTRASOFT) lancets Use as instructed   metoprolol succinate (TOPROL-XL) 50 MG 24 hr tablet Take 1 tablet (50 mg total) by mouth daily.   pantoprazole (PROTONIX) 20 MG tablet TAKE 1 TABLET BY MOUTH ONCE DAILY AS NEEDED FOR  HEARTBURN  OR  INDIGESTION   sacubitril-valsartan (ENTRESTO) 97-103 MG Take 1 tablet by mouth 2 (two) times daily.   sildenafil (VIAGRA) 100 MG tablet Take 1 tablet (100 mg total) by mouth daily as needed for erectile dysfunction.    Allergies: Metformin and related and Sulfa antibiotics  Social History   Tobacco Use   Smoking status: Former    Packs/day: 0.25    Years: 15.00    Total pack years: 3.75    Types: Cigarettes    Quit date: 2019    Years since quitting: 5.1   Smokeless tobacco: Never   Tobacco comments:    Patient has smoked 10 cigarettes within the last month. 05/05/2022  Vaping Use   Vaping Use: Never used  Substance Use Topics   Alcohol use: Yes    Alcohol/week: 0.0 standard drinks of alcohol    Comment: Occassional beer, shots of liquor   Drug use: Yes    Frequency: 2.0 times per week    Types: Marijuana    Comment: smoked this morning 05/05/22    Family History  Problem Relation Age of Onset   Diabetes Maternal Grandmother    Heart attack Maternal Grandmother 80   Diabetes Paternal Grandfather     Review of Systems: A 12-system review of systems was performed and was negative except as noted in the HPI.  --------------------------------------------------------------------------------------------------  Physical Exam: BP 120/82 (BP Location: Left Arm, Patient Position: Sitting, Cuff Size: Large)   Pulse 82   Ht 5' 10"$  (1.778 m)   Wt (!) 360 lb 6 oz (163.5 kg)   SpO2 95%   BMI 51.71 kg/m   General:  NAD. Neck: No JVD or HJR. Lungs: Clear to auscultation bilaterally without wheezes or crackles. Heart: Regular rate and rhythm without murmurs, rubs, or gallops. Abdomen: Soft,  nontender, nondistended. Extremities: No lower extremity edema.  Lab Results  Component Value Date   WBC 7.4 09/28/2020   HGB 13.5 09/28/2020   HCT 40.5 09/28/2020   MCV 88.2 09/28/2020   PLT 377 09/28/2020    Lab Results  Component Value Date   NA 135 04/25/2022   K 4.3 04/25/2022   CL 96 04/25/2022   CO2 29 04/25/2022   BUN 12 04/25/2022   CREATININE 0.84 04/25/2022   GLUCOSE 414 (H) 04/25/2022   ALT 21 06/30/2021    Lab Results  Component Value Date   CHOL 229 (H) 06/30/2021   HDL 29.10 (L) 06/30/2021   LDLCALC 93 12/17/2017   LDLDIRECT 84.0 09/16/2021   TRIG (H) 06/30/2021    916.0 Triglyceride is over 400; calculations on Lipids are invalid.   CHOLHDL 8 06/30/2021    --------------------------------------------------------------------------------------------------  ASSESSMENT AND PLAN: Chronic HFrEF due to nonischemic cardiomyopathy: Alvin Phillips appears euvolemic with NYHA class I symptoms.  Most recent echocardiogram in 12/2021 showed persistent moderate reduction in LVEF of 35-40%.  We have agreed to increase  metoprolol succinate to 100 mg daily.  Continue Entresto 97-103 mg twice daily.  Alvin Phillips was previously intolerant of empagliflozin due to balanitis.  However, Dr. Danise Mina has prescribed low-dose dapagliflozin that Alvin Phillips has yet to begin.  We discussed risks for recurrent balanitis but we will move forward with starting the medicine as recommended by Dr. Danise Mina.  Ultimately, this should be titrated up to 10 mg daily, if tolerated, as that is the dose that was studied in the setting of heart failure.  Could consider addition of spironolactone in the future as well as consultation with the advanced heart failure team.  Wolff-Parkinson-White syndrome: No palpitations reported.  He previously underwent accessory pathway ablation but had return of the accessory pathway the next day.  He was on sotalol at 1 point but is currently on metoprolol monotherapy.   Will increase metoprolol succinate to 100 mg daily.  Given the lack of ongoing symptoms, defer repeat EP evaluation for the time being (last saw Dr. Lovena Le in 2022).  Obstructive sleep apnea: Diagnosed around 2016 but not on CPAP.  We will refer him to our sleep medicine team to discuss initiation of CPAP versus repeat sleep study for further evaluation.  Hyperlipidemia associated with type 2 diabetes mellitus: Continue atorvastatin 20 mg daily.  Ongoing management of diabetes mellitus per Dr. Danise Mina.  Morbid obesity: BMI remains greater than 50.  Weight loss encouraged through diet and exercise.  Follow-up: Return to clinic in 3 months.  Nelva Bush, MD 05/05/2022 9:59 AM

## 2022-05-05 ENCOUNTER — Ambulatory Visit: Payer: 59 | Attending: Internal Medicine | Admitting: Internal Medicine

## 2022-05-05 ENCOUNTER — Encounter: Payer: Self-pay | Admitting: Internal Medicine

## 2022-05-05 VITALS — BP 120/82 | HR 82 | Ht 70.0 in | Wt 360.4 lb

## 2022-05-05 DIAGNOSIS — I456 Pre-excitation syndrome: Secondary | ICD-10-CM

## 2022-05-05 DIAGNOSIS — I5022 Chronic systolic (congestive) heart failure: Secondary | ICD-10-CM

## 2022-05-05 DIAGNOSIS — E785 Hyperlipidemia, unspecified: Secondary | ICD-10-CM

## 2022-05-05 DIAGNOSIS — G4733 Obstructive sleep apnea (adult) (pediatric): Secondary | ICD-10-CM

## 2022-05-05 DIAGNOSIS — I428 Other cardiomyopathies: Secondary | ICD-10-CM

## 2022-05-05 DIAGNOSIS — E1169 Type 2 diabetes mellitus with other specified complication: Secondary | ICD-10-CM

## 2022-05-05 MED ORDER — METOPROLOL SUCCINATE ER 100 MG PO TB24: 100.0000 mg | ORAL_TABLET | Freq: Every day | ORAL | 3 refills | Status: DC

## 2022-05-05 NOTE — Patient Instructions (Signed)
Medication Instructions:  Your physician recommends the following medication changes.  INCREASE: Metoprolol Succinate to 100 mg by mouth daily  *If you need a refill on your cardiac medications before your next appointment, please call your pharmacy*   Lab Work: None ordered today   Testing/Procedures: None ordered today   Follow-Up: At [facility], you and your health needs are our priority.  As part of our continuing mission to provide you with exceptional heart care, we have created designated Provider Care Teams.  These Care Teams include your primary Cardiologist (physician) and Advanced Practice Providers (APPs -  Physician Assistants and Nurse Practitioners) who all work together to provide you with the care you need, when you need it.  We recommend signing up for the patient portal called "MyChart".  Sign up information is provided on this After Visit Summary.  MyChart is used to connect with patients for Virtual Visits (Telemedicine).  Patients are able to view lab/test results, encounter notes, upcoming appointments, etc.  Non-urgent messages can be sent to your provider as well.   To learn more about what you can do with MyChart, go to [email or web address].    Your next appointment:   3 month(s)  Provider:   You may see Nelva Bush, MD or one of the following Advanced Practice Providers on your designated Care Team:   Murray Hodgkins, NP Christell Faith, PA-C Cadence Kathlen Mody, PA-C Gerrie Nordmann, NP

## 2022-05-11 ENCOUNTER — Other Ambulatory Visit (HOSPITAL_COMMUNITY): Payer: Self-pay

## 2022-05-11 ENCOUNTER — Telehealth: Payer: Self-pay

## 2022-05-11 NOTE — Telephone Encounter (Signed)
Pharmacy Patient Advocate Encounter   Received notification from [facility] that prior authorization for Farxiga 69m is required/requested.  Per Test Claim: product/service not covered - plan/benefit exclusion. Non-formulary drug   PA submitted on 05/11/22 to (ins) [employer] via CoverMyMeds Key [id number] is pending

## 2022-05-12 ENCOUNTER — Other Ambulatory Visit (HOSPITAL_COMMUNITY): Payer: Self-pay

## 2022-05-13 ENCOUNTER — Telehealth: Payer: Self-pay

## 2022-05-13 MED ORDER — EMPAGLIFLOZIN 10 MG PO TABS: 10.0000 mg | ORAL_TABLET | Freq: Every day | ORAL | 6 refills | Status: DC

## 2022-05-13 NOTE — Telephone Encounter (Signed)
Spoke with pt to relay Dr. Synthia Innocent message. Pt states he tried [place] previously and had issues with blisters in groin. Advised pt not to pick up rx until he hears something back from our office. Pt verbalizes understanding.   After researching pt's chart, I see in 03/27/19 OV notes rx was stopped due to groin boils. Plz advise.

## 2022-05-13 NOTE — Addendum Note (Signed)
Addended by: Ria Bush on: 05/13/2022 04:45 PM   Modules accepted: Orders

## 2022-05-13 NOTE — Telephone Encounter (Signed)
Given this issue with jardiance previously , rec we not start farxiga or jardiance.  Instead continue current diabetes regimen of amaryl '2mg'$  daily and trulicity '3mg'$  weekly. Is he willing to try metformin XR again?

## 2022-05-13 NOTE — Telephone Encounter (Signed)
Spoke with pt relaying Dr. Synthia Innocent message. Pt verbalizes understanding and declines to resume metformin XR at this time due to diarrhea it caused previously. Fyi to Dr. Darnell Level.

## 2022-05-13 NOTE — Telephone Encounter (Signed)
Please notify patient- farxiga (dapagliflozin) was denied by insurance.  Similar med on formulary is Jardiance - I've sent '10mg'$  dose to his pharmacy to start taking in place of farxiga.  Watch for yeast infection, recurrent UTI or groin cellulitis/infection on this medicine and stop and let us know if they occur.

## 2022-05-13 NOTE — Telephone Encounter (Signed)
Received a fax regarding Prior Authorization from [employer] for [place] '5mg'$ .  Authorization has been DENIED because we reviewed the infromation about your condition and circumstances. The policy states that this medication may be approved when: -The member has a clinical condition or needs a specific dosage form for which there is no alternative on the formulary OR -The listed formulary alternatives are not recommended based on published guidelines or clinical literature OR -The formulary alternatives will likely be ineffective or less effective for the member OR -The formulary alternatives will likely cause an adverse effect OR -The member is unable to take the required number of formulary alternatives for the given diagnosis due to a trial and inadequate treatment response or contraindication OR -The member has tried and failed the required number of formulary alternatives. Based on the policy and the information we have, your request is denied. We did not receive any documentation that you meet any of the criteria outlined above. Formulary alternative(s) are Jardiance. Requirement: 3 in a class with 3 or more alternatives, 2 in a class with 2 alternatives, or 1 in a class with only 1 alternative. Please refer to your plan documents for a complete list of alternatives.  Phone# [phone]  Denial letter scanned to chart

## 2022-05-13 NOTE — Addendum Note (Signed)
Addended by: Ria Bush on: 05/13/2022 01:23 PM   Modules accepted: Orders

## 2022-07-13 ENCOUNTER — Other Ambulatory Visit: Payer: Self-pay | Admitting: Internal Medicine

## 2022-07-13 ENCOUNTER — Other Ambulatory Visit: Payer: Self-pay | Admitting: Family Medicine

## 2022-07-13 DIAGNOSIS — E1159 Type 2 diabetes mellitus with other circulatory complications: Secondary | ICD-10-CM

## 2022-07-13 DIAGNOSIS — E1169 Type 2 diabetes mellitus with other specified complication: Secondary | ICD-10-CM

## 2022-07-13 DIAGNOSIS — E1129 Type 2 diabetes mellitus with other diabetic kidney complication: Secondary | ICD-10-CM

## 2022-07-25 ENCOUNTER — Ambulatory Visit: Payer: 59 | Admitting: Family Medicine

## 2022-08-03 ENCOUNTER — Telehealth: Payer: Self-pay | Admitting: Family Medicine

## 2022-08-03 DIAGNOSIS — E1169 Type 2 diabetes mellitus with other specified complication: Secondary | ICD-10-CM

## 2022-08-03 MED ORDER — SEMAGLUTIDE (2 MG/DOSE) 8 MG/3ML ~~LOC~~ SOPN: 2.0000 mg | PEN_INJECTOR | SUBCUTANEOUS | 11 refills | Status: DC

## 2022-08-03 NOTE — Telephone Encounter (Signed)
Patient called in and stated that Dulaglutide (TRULICITY) 3 MG/0.5ML SOPN has been on back order for 2 weeks now. He was wanting to know if there was something else that could be called in for him. Please advise. Thank you!

## 2022-08-03 NOTE — Telephone Encounter (Addendum)
Due to lack of availability of trulicity, recommend we switch to ozempic which is now more readily available.  I've sent in ozempic 2mg  weekly to go ahead and start right away.  Stop and let us know if develops nausea, constipation, diarrhea, abdominal pain

## 2022-08-03 NOTE — Addendum Note (Signed)
Addended by: Eustaquio Boyden on: 08/03/2022 05:04 PM   Modules accepted: Orders

## 2022-08-04 NOTE — Telephone Encounter (Signed)
Spoke with pt relaying Dr. G's message. Pt verbalizes understanding.  

## 2022-08-08 ENCOUNTER — Other Ambulatory Visit: Payer: Self-pay | Admitting: Family Medicine

## 2022-08-09 NOTE — Telephone Encounter (Signed)
Message from pharmacy:  please resend for trulicity per insurance. thanks.

## 2022-08-11 ENCOUNTER — Ambulatory Visit: Payer: 59 | Admitting: Internal Medicine

## 2022-08-11 NOTE — Telephone Encounter (Signed)
Please contact patient - due to Trulicity backorder I sent in ozempic last week. Now we're receiving message from pharmacy that insurance requires he be on Trulicity. I've sent in trulicity - let us know if still unavailable.

## 2022-08-11 NOTE — Telephone Encounter (Signed)
Noted. (See 08/08/22 refill note.)

## 2022-08-11 NOTE — Telephone Encounter (Signed)
See Rx refill request.  We have resubmitted ozempic and will submit PA.

## 2022-08-11 NOTE — Telephone Encounter (Signed)
Please submit PA for Ozempic as trulicity is unavailable.

## 2022-08-11 NOTE — Telephone Encounter (Signed)
Spoke with pt relaying Dr. Timoteo Expose message. Pt is confused and expresses his frustration that Trulicity was sent in again and he was already told it's on backorder. Pt is waiting for Ozempic rx be sent, which he states he already knows a PA will be needed. Then pt disconnected call.   Spoke with [facility] to see, if by chance, Trulicity is available yet. Told 3 mg and 4.5 mg are still on backorder. I explained that an Ozempic rx was sent for pt between previous Trulicity rx and rx sent today and the message we received. States the Ozempic probably needs PA. So they will reactivate rx and fax PA request.   Plz advise.

## 2022-08-11 NOTE — Telephone Encounter (Signed)
Patient would like to know if Ozempic was sent in for him? He said that he has been off of his diabetic medication for a few days now,and he knows that the ozempic will require a prior authorization that will take 3 days. He spoke to someone a few days ago that told him that the ozempic would be called in for him.

## 2022-08-22 ENCOUNTER — Other Ambulatory Visit (HOSPITAL_COMMUNITY): Payer: Self-pay

## 2022-08-22 ENCOUNTER — Telehealth: Payer: Self-pay

## 2022-08-22 NOTE — Telephone Encounter (Signed)
My chart sent to let patient know approved.

## 2022-08-22 NOTE — Telephone Encounter (Signed)
PA has been submitted and is currently pending, documented in separate encounter.

## 2022-08-22 NOTE — Telephone Encounter (Signed)
Pharmacy Patient Advocate Encounter  Prior Authorization for Ozempic has been APPROVED by [facility] from 08/22/2022 to 08/22/2023.   PA # [id number]

## 2022-08-22 NOTE — Telephone Encounter (Signed)
Patient Advocate Encounter   Received notification from [employer] that prior authorization for Ozempic is required.   PA submitted on 08/22/2022 [facility] Insurance [employer] Status is pending

## 2022-08-22 NOTE — Telephone Encounter (Signed)
Noted. (See 08/22/22 phn note.)

## 2022-08-23 NOTE — Telephone Encounter (Signed)
Rtn pt's mom, Stanton Kidney (on dpr), call. I suggested they contact pt's ins co and explain to them pt is prescribed Trulicity but it's currently on backorder. Let them know Ozempic was prescribed in meantime but pt cannot afford it. Ask ins co what they will cover in the same "family" as Trulicity that pt can afford while he waits for it to come back in stock. She verbalizes understanding and will see what they can find out.

## 2022-08-23 NOTE — Telephone Encounter (Signed)
Patient's mother contacted the office regarding this issue again, would like a call back when possible, please advise [phone]

## 2022-08-23 NOTE — Telephone Encounter (Signed)
Patient's mother contacted the office regarding this medication, says cost of this prescription is $900, wants to know if there is a cheaper alternative for this medication. States the patient has not had any of this medication in a month, would like an alternative either injectable or even a tablet. Please advise, thank you.

## 2022-08-24 ENCOUNTER — Other Ambulatory Visit: Payer: Self-pay

## 2022-08-24 ENCOUNTER — Telehealth: Payer: Self-pay | Admitting: Family Medicine

## 2022-08-24 DIAGNOSIS — E1169 Type 2 diabetes mellitus with other specified complication: Secondary | ICD-10-CM

## 2022-08-24 DIAGNOSIS — E1129 Type 2 diabetes mellitus with other diabetic kidney complication: Secondary | ICD-10-CM

## 2022-08-24 MED ORDER — TRULICITY 1.5 MG/0.5ML ~~LOC~~ SOAJ: 3.0000 mg | SUBCUTANEOUS | 2 refills | Status: DC

## 2022-08-24 NOTE — Telephone Encounter (Signed)
Called patient let him know where we are with the quantity exception. Reviewed all options from Dr. Reece Agar message. Patient would like to give it until tomorrow to see if exception can be approved. If not he will go on daily insulin. If approved he would like to continue on Trulicity.  If we can get approved patient agreed that he will reach out to the pharmacy the week before he is due and let us know if they have in stock so we can avoid this issue in the future.  I informed patient that I will call insurance back at 1030 to see what the result is from insurance and reach out to him to let him know what the plan is.  You will be out out of the office tomorrow. If it is not approved if you want to let me know what the alternative insulin and instructions would be so I can get set up for patient in the event that Trulicity is no longer an option at this time.

## 2022-08-24 NOTE — Telephone Encounter (Signed)
To follow up on the Trulicity. I have called the pharmacy and verified that that they have received script that was sent in for 1.5 inject two to equal to 3ml weekly. They did need a quantity exception and that has been started with insurance. We will not have answer from that for 24 hrs.  Case number [id number] Phone number [phone]

## 2022-08-24 NOTE — Telephone Encounter (Signed)
Thank you for speaking with patient.  If Trulicity not approved, would start basal insulin.  If started, he would need to start monitoring sugars regularly with glucometer (has previously declined checking sugars). Furthermore, if daily insulin started, he may qualify for coverage for continuous glucose monitor which may make it easier to check sugars.  Would recommend basaglar pen 10u once daily.  Would then titrate by 2 units every 3 days if average fasting sugar stays >150 to max 30u daily and recheck at CPE in July.

## 2022-08-24 NOTE — Telephone Encounter (Signed)
Call transferred from front office Supervisor [facility] Marcell Barlow). Pt extremely angry due to not having medication for a month. Pt expressed belief of practice getting "kickbacks" from pharmaceutical companies that influence how we prescribe. Pt doesn't feel anyone cares he doesn't have medication and how the insurance company makes it too hard to get what is prescribed. Pt cannot comprehend how a provider doesn't know that a medication is on a nationwide backorder. Ultimately, pt doesn't care if the medication is Trulicity or Ozempic. He is asking for a medication(s) he can access consistently to maintain is diabetes.  I committed to communicating the request.

## 2022-08-24 NOTE — Telephone Encounter (Addendum)
He's already on glimepiride and has intolerance to metformin. Other meds are nonpreferred - avoiding actos in CHF and avoiding farxiga/jardiance in h/o groin infection (he declined retrial of this although would be good for heart protection benefit).  Only other option would be daily insulin shot and he's previously declined his. Is he willing to retry this? Ozempic/Trulicity are best options for him.  Ozempic would be best option as this is the best supply however insurance did not cover well despite completing PA (unaffordable to patient).  We will try trulicity as prescribed 1.5mg  twice weekly due to issue with 3mg  backorder. Awaiting quantity exception request done today.

## 2022-08-24 NOTE — Telephone Encounter (Signed)
Patient states pharmacy has the following trulicity dosages-  .75, 1.5, and 4.5  Patient has been out of medication for a month, needs script changed asap

## 2022-08-25 MED ORDER — BLOOD GLUCOSE TEST VI STRP
1.0000 | ORAL_STRIP | Freq: Three times a day (TID) | 0 refills | Status: AC
Start: 2022-08-25 — End: 2022-09-24

## 2022-08-25 MED ORDER — BASAGLAR KWIKPEN 100 UNIT/ML ~~LOC~~ SOPN
30.0000 [IU] | PEN_INJECTOR | Freq: Every day | SUBCUTANEOUS | 1 refills | Status: DC
Start: 2022-08-25 — End: 2022-10-11

## 2022-08-25 MED ORDER — BLOOD GLUCOSE MONITORING SUPPL DEVI
1.0000 | Freq: Three times a day (TID) | 0 refills | Status: DC
Start: 2022-08-25 — End: 2023-01-11

## 2022-08-25 MED ORDER — DEXCOM G7 RECEIVER DEVI: 3 refills | Status: DC

## 2022-08-25 MED ORDER — DEXCOM G7 SENSOR MISC: 3 refills | Status: DC

## 2022-08-25 MED ORDER — LANCETS MISC. MISC
1.0000 | Freq: Three times a day (TID) | 0 refills | Status: AC
Start: 2022-08-25 — End: 2022-09-24

## 2022-08-25 MED ORDER — LANCET DEVICE MISC
1.0000 | Freq: Three times a day (TID) | 0 refills | Status: AC
Start: 2022-08-25 — End: 2022-09-24

## 2022-08-25 NOTE — Addendum Note (Signed)
Addended by: Donnamarie Poag on: 08/25/2022 10:29 AM   Modules accepted: Orders

## 2022-08-25 NOTE — Telephone Encounter (Signed)
Called insurance was not approved. Have called and reviewed all information with patient. I reviewed the importance of checking blood sugar several times and patient voiced understanding. He is aware that we are calling in testing supplies and inulin as well as CGM supplies He was made a ware that the CGM will need auth and will take a few days to pick up. Reviewed all symptoms of hypoglycemia as well as hyperglycemia with patient and he repeated back to me. Offered to set up nurse visit to review use of testing supplies as well as insulin use but patient declined will call to set up nurse visit for teaching if changes mind or has any questions. I have reviewed dosing of Basal and titration instructions. Informed patient will send that information in my chart message so he will have to reference anytime.

## 2022-08-26 NOTE — Telephone Encounter (Signed)
Please see phone not for more documentation. There were 2 open for same thing for patient.

## 2022-08-31 ENCOUNTER — Other Ambulatory Visit: Payer: Self-pay | Admitting: Family Medicine

## 2022-08-31 DIAGNOSIS — E1169 Type 2 diabetes mellitus with other specified complication: Secondary | ICD-10-CM

## 2022-08-31 DIAGNOSIS — E1129 Type 2 diabetes mellitus with other diabetic kidney complication: Secondary | ICD-10-CM

## 2022-08-31 DIAGNOSIS — E1159 Type 2 diabetes mellitus with other circulatory complications: Secondary | ICD-10-CM

## 2022-09-09 NOTE — Progress Notes (Deleted)
Cardiology Office Note    Date:  09/09/2022   ID:  CALVIN JABLONOWSKI, DOB Oct 24, 1980, MRN [id number]  PCP:  Eustaquio Boyden, MD  Cardiologist:  Yvonne Kendall, MD  Electrophysiologist:  Lewayne Bunting, MD   Chief Complaint: ***  History of Present Illness:   Alvin Phillips is a 42 y.o. male with history of Wolff-Parkinson-White syndrome, HFrEF secondary to NICM, palpitations, HTN, uncontrolled diabetes mellitus, obesity, sleep apnea not on CPAP, and GERD who presents for ***.  He was admitted to the hospital in 11/2017 with persistent tachypalpitations and dyspnea.  He was found to be in SVT with a rate of 190.  He converted spontaneously and was noted to have delta wave consistent with Parkinson White syndrome.  CTA of the chest was negative for PE, though did show interstitial edema.  Echo showed an EF of 20 to 25% with diffuse hypokinesis.  R/LHC showed normal coronary arteries with elevated right heart filling pressure and PCWP with moderate to severe pulmonary hypertension.  He was transferred to [facility] and underwent catheter ablation for WPW, which was initially successful, though he was noted to have recurrent delta waves and subsequently placed on sotalol.  More recently, has been managed with metoprolol monotherapy.  He has had intermittent compliance with antihypertensive therapy and cardiology follow-up.  Repeat echo in 02/2018 demonstrated an EF of 30 to 35%, diffuse hypokinesis, mildly dilated left atrium, normal RV systolic function, and an estimated PASP of 44 mmHg.  Most recent echo from 12/2021 demonstrated an EF of 35 to 40%, global hypokinesis, moderately dilated LV internal cavity size, grade 2 diastolic dysfunction, normal RV systolic function and ventricular cavity size, moderately dilated left atrium, mild mitral regurgitation, and a normal CVP.  He was last seen in the office in 04/2022 and reported having been off of his medications for a few weeks due to insurance issues,  though indicated he was back on his current regimen.  He was without symptoms of angina or cardiac decompensation.  It was recommended he increase Toprol-XL to 100 mg daily with continuation of Entresto.  He was also noted he had been prescribed low-dose dapagliflozin by outside office (deviously intolerant to empagliflozin due to balanitis).  ***   Labs independently reviewed: 04/2022 - potassium 4.3, BUN 12, serum creatinine 0.84, A1c 10.3 08/2021 - direct LDL 84 06/2021 - TC 229, TG 916, HDL 29, albumin 4.2, AST/ALT normal 09/2020 - Hgb 13.5, PLT 377 11/2017 - TSH normal  Past Medical History:  Diagnosis Date   CHF (congestive heart failure) (HCC)    Diabetes mellitus without complication (HCC) 07/10/13   referred to DSME, did not show (04/2014)   GERD (gastroesophageal reflux disease)    HFrEF (heart failure with reduced ejection fraction) (HCC)    a. 11/2017 Echo: EF 20-25%, diff HK;  b. 02/2018 Echo: EF 30-35%, diff HK   Hypertension    Marijuana abuse    Morbidly obese (HCC) 03/24/2014   NICM (nonischemic cardiomyopathy) (HCC)    a. 11/2017 Echo: EF 20-25%, diff HK, mild MR, mildy dil LA, PASP ; b. 11/2017 Cath: nl cors. PCWP . CO/CI 4.8/1.79; c. 02/2018 Echo: EF 30-35%, diff HK, mildly dil LA, nl RV fxn. PASP .   OSA (obstructive sleep apnea) 10/29/2013   Tobacco abuse    Wolff-Parkinson-White (WPW) syndrome    a. 11/2017 Admitted w/ SVT @ 190 bpm; b. 12/2017 EPS w/RFCA: right mid septal accessory pathway <1cm from AV node. Ablation performed but developed  recurrent WPW post-procedure and placed on Sotalol.    Past Surgical History:  Procedure Laterality Date   CARDIAC CATHETERIZATION     FRACTURE SURGERY Left 03/21/2000   fractured arm   HERNIA REPAIR  12/16/2013   umbilical   PVC ABLATION N/A 12/22/2017   Procedure: WPW  ABLATION;  Surgeon: Marinus Maw, MD;  Location: [facility] INVASIVE CV LAB;  Service: Cardiovascular;  Laterality: N/A;   RIGHT/LEFT HEART CATH AND  CORONARY ANGIOGRAPHY N/A 12/18/2017   Procedure: RIGHT/LEFT HEART CATH AND CORONARY ANGIOGRAPHY;  Surgeon: Iran Ouch, MD;  Location: [facility] INVASIVE CV LAB;  Service: Cardiovascular;  Laterality: N/A;    Current Medications: No outpatient medications have been marked as taking for the 09/14/22 encounter (Appointment) with Sondra Barges, PA-C.    Allergies:   Metformin and related and Sulfa antibiotics   Social History   Socioeconomic History   Marital status: Single    Spouse name: Not on file   Number of children: 2   Years of education: Not on file   Highest education level: Not on file  Occupational History   Occupation: Advertising account executive: SELF-EMPLOYED    Comment: [employer]  Tobacco Use   Smoking status: Former    Packs/day: 0.25    Years: 15.00    Additional pack years: 0.00    Total pack years: 3.75    Types: Cigarettes    Quit date: 2019    Years since quitting: 5.4   Smokeless tobacco: Never   Tobacco comments:    Patient has smoked 10 cigarettes within the last month. 05/05/2022  Vaping Use   Vaping Use: Never used  Substance and Sexual Activity   Alcohol use: Yes    Alcohol/week: 0.0 standard drinks of alcohol    Comment: Occassional beer, shots of liquor   Drug use: Yes    Frequency: 2.0 times per week    Types: Marijuana    Comment: smoked this morning 05/05/22   Sexual activity: Not on file  Other Topics Concern   Not on file  Social History Narrative   Lives with girlfriend Alvin Phillips grew up in [place]. He is currently living in [place]. He has a daughter and son Alvin Phillips, Alvin Phillips). He breeds dogs Medical sales representative).   Occ: Teaching laboratory technician rep   Social Determinants of Health   Financial Resource Strain: Not on file  Food Insecurity: Not on file  Transportation Needs: Not on file  Physical Activity: Not on file  Stress: Not on file  Social Connections: Not on file     Family History:  The  patient's family history includes Diabetes in his maternal grandmother and paternal grandfather; Heart attack (age of onset: 46) in his maternal grandmother.  ROS:   12-point review of systems is negative unless otherwise noted in the HPI.   EKGs/Labs/Other Studies Reviewed:    Studies reviewed were summarized above. The additional studies were reviewed today: ***  EKG:  EKG is ordered today.  The EKG ordered today demonstrates ***  Recent Labs: 04/25/2022: BUN 12; Creatinine, Ser 0.84; Potassium 4.3; Sodium 135  Recent Lipid Panel    Component Value Date/Time   CHOL 229 (H) 06/30/2021 0743   TRIG (H) 06/30/2021 0743    916.0 Triglyceride is over 400; calculations on Lipids are invalid.   HDL 29.10 (L) 06/30/2021 0743   CHOLHDL 8 06/30/2021 0743   VLDL 43 (H) 12/17/2017 0535   LDLCALC 93 12/17/2017  0535   LDLDIRECT 84.0 09/16/2021 0834    PHYSICAL EXAM:    VS:  There were no vitals taken for this visit.  BMI: There is no height or weight on file to calculate BMI.  Physical Exam  Wt Readings from Last 3 Encounters:  05/05/22 (!) 360 lb 6 oz (163.5 kg)  04/25/22 (!) 360 lb 4 oz (163.4 kg)  02/02/22 (!) 350 lb (158.8 kg)     ASSESSMENT & PLAN:   HFrEF secondary to NICM:  Wolff-Parkinson-White syndrome:  HTN: Blood pressure ***  HLD with hypertriglyceridemia: LDL 84 in 08/2021 with a triglyceride of 916 from 06/2021.  DM2: A1c 10.3.  Obesity:   {Are you ordering a CV Procedure (e.g. stress test, cath, DCCV, TEE, etc)?   Press F2        :[id number]}     Disposition: F/u with Dr. Okey Dupre or an APP in ***, and EP as directed.   Medication Adjustments/Labs and Tests Ordered: Current medicines are reviewed at length with the patient today.  Concerns regarding medicines are outlined above. Medication changes, Labs and Tests ordered today are summarized above and listed in the Patient Instructions accessible in Encounters.   Signed, Eula Listen, PA-C 09/09/2022 2:24 PM      [facility] [place] [address] [place], [place] [address] [phone]

## 2022-09-14 ENCOUNTER — Ambulatory Visit: Payer: 59 | Attending: Internal Medicine | Admitting: Physician Assistant

## 2022-09-15 ENCOUNTER — Encounter: Payer: Self-pay | Admitting: Physician Assistant

## 2022-09-19 ENCOUNTER — Ambulatory Visit (INDEPENDENT_AMBULATORY_CARE_PROVIDER_SITE_OTHER): Payer: 59 | Admitting: Family Medicine

## 2022-09-19 ENCOUNTER — Encounter: Payer: Self-pay | Admitting: Family Medicine

## 2022-09-19 VITALS — BP 130/80 | HR 81 | Temp 98.5°F | Wt 355.0 lb

## 2022-09-19 DIAGNOSIS — L02229 Furuncle of trunk, unspecified: Secondary | ICD-10-CM

## 2022-09-19 MED ORDER — DOXYCYCLINE HYCLATE 100 MG PO CAPS: 100.0000 mg | ORAL_CAPSULE | Freq: Two times a day (BID) | ORAL | 0 refills | Status: DC

## 2022-09-19 NOTE — Progress Notes (Signed)
   Subjective:    Patient ID: Alvin Phillips, male    DOB: Oct 09, 1980, 42 y.o.   MRN: [id number]  HPI Here for one week of a painful boil under the left breast. Yesterday it began to drain some fluid. No fever.    Review of Systems  Constitutional: Negative.   Respiratory: Negative.    Cardiovascular: Negative.   Skin:  Positive for wound.       Objective:   Physical Exam Constitutional:      Appearance: Normal appearance.  Cardiovascular:     Rate and Rhythm: Normal rate and regular rhythm.     Pulses: Normal pulses.     Heart sounds: Normal heart sounds.  Pulmonary:     Effort: Pulmonary effort is normal.     Breath sounds: Normal breath sounds.  Skin:    Comments: There is a tender boil in the left breast which is draining some purulent fluid  Neurological:     Mental Status: He is alert.           Assessment & Plan:  Boil. This was lanced with a scalpel, and a large amount of purulent fluid was expressed. This was dressed with gauze. We will treat him with 10 days of Doxycycline.  Gershon Crane, MD

## 2022-09-20 ENCOUNTER — Telehealth: Payer: Self-pay

## 2022-09-20 ENCOUNTER — Other Ambulatory Visit (HOSPITAL_COMMUNITY): Payer: Self-pay

## 2022-09-20 NOTE — Telephone Encounter (Signed)
Pharmacy Patient Advocate Encounter   Received notification that prior authorization for Trulicity 3mg /0.49ml is required/requested.   PA submitted to [facility] via CoverMyMeds Key  # [id number] Status is pending

## 2022-09-20 NOTE — Telephone Encounter (Signed)
Patient Advocate Encounter  Prior Authorization for Trulicity has been approved with [employer].    PA# [id number] Effective dates: 09/20/22 through 09/20/23

## 2022-09-21 ENCOUNTER — Other Ambulatory Visit: Payer: Self-pay

## 2022-09-21 ENCOUNTER — Telehealth: Payer: Self-pay | Admitting: Family Medicine

## 2022-09-21 MED ORDER — CEPHALEXIN 500 MG PO CAPS: 500.0000 mg | ORAL_CAPSULE | Freq: Three times a day (TID) | ORAL | 0 refills | Status: DC

## 2022-09-21 NOTE — Telephone Encounter (Signed)
Cary with triage nurse called and stated she spoke to pt. He was prescribed an antibiotic by Dr. Clent Ridges on 09/19/22, however he states the antibiotic is not agreeing with him. He denies any fever, but states he has an upset stomach. He is requesting a new antibiotic to be prescribed, he also denies being allergic to any medication that he is aware of.

## 2022-09-21 NOTE — Telephone Encounter (Signed)
Stop the Doxycycline and call in Keflex 500 mg TID for 10 days  

## 2022-09-21 NOTE — Telephone Encounter (Signed)
New Rx sent to pt pharmacy, pt notified 

## 2022-10-05 ENCOUNTER — Ambulatory Visit: Payer: 59 | Admitting: Family Medicine

## 2022-10-05 ENCOUNTER — Other Ambulatory Visit: Payer: Self-pay

## 2022-10-05 DIAGNOSIS — E1169 Type 2 diabetes mellitus with other specified complication: Secondary | ICD-10-CM

## 2022-10-05 DIAGNOSIS — E1129 Type 2 diabetes mellitus with other diabetic kidney complication: Secondary | ICD-10-CM

## 2022-10-05 NOTE — Telephone Encounter (Signed)
 Opened in error

## 2022-10-05 NOTE — Telephone Encounter (Signed)
Trulicity Last rx:  08/24/22, 4 mL Last OV:  04/25/22, 3 mo DM f/u Next OV:  was scheduled today- no show

## 2022-10-06 ENCOUNTER — Encounter: Payer: Self-pay | Admitting: Family Medicine

## 2022-10-07 MED ORDER — TRULICITY 1.5 MG/0.5ML ~~LOC~~ SOAJ: 3.0000 mg | SUBCUTANEOUS | 11 refills | Status: DC

## 2022-10-07 NOTE — Telephone Encounter (Signed)
Per phone note earlier this month: Patient Advocate Encounter Prior Authorization for Trulicity has been approved with [employer].    PA# [id number] Effective dates: 09/20/22 through 09/20/23 __________________________________________________ I will refill trulicity 1.5mg  pens take 3mg  weekly Please notify pt.  He missed appt this week. Please reschedule.   I would also verify that he's taking trulicity 3mg  weekly as sent in and whether he's taking basaglar insulin.

## 2022-10-11 NOTE — Telephone Encounter (Signed)
Called patient he is not taking Hospital doctor. He was able to get the Trulicity right after that was called in. He has not picked up recent script. We have received auth from [place] but patient states still needs one for his [facility]. That normally covers better. I have started [place]. See below for information. Have called patient and let him know were we are at with it.

## 2022-10-11 NOTE — Addendum Note (Signed)
Addended by: Donnamarie Poag on: 10/11/2022 04:53 PM   Modules accepted: Orders

## 2022-10-12 NOTE — Telephone Encounter (Signed)
Patient called in asking about PA for this medication Trulicity through [facility]. He asked to speak with administration due to him having this same issue the last time with getting his medication.

## 2022-10-12 NOTE — Telephone Encounter (Signed)
Alexia from [facility] called in to follow up on this PA request. She stated that she seen a denial and a pending auth but wasn't sure which one is current. She was wanting a status update. She stated that the denial is due not enough information regarding him having type 2 diabetes. She can be reached at [phone] with reference #: [id number].

## 2022-10-12 NOTE — Telephone Encounter (Signed)
Called patient let know we are working on getting approval will call back with more information once received.

## 2022-10-14 NOTE — Telephone Encounter (Signed)
Called insurance to follow up. This was denied and needed further information. As requested have sent last office note with A1c of higher than 6.5 to fax number below. Will call and follow up on Monday   Fax : [phone] Reff # [id number]

## 2022-10-18 ENCOUNTER — Other Ambulatory Visit: Payer: Self-pay | Admitting: Family Medicine

## 2022-10-18 NOTE — Telephone Encounter (Signed)
Pt called to get update on PA status. Told pt Joellen's response. Pt states he was told by [facility] that the only thing that's needed for eligibility approval for the PA, was proof that the pt is a Type 2 diabetic. Pt states its going on 2 weeks since he's had his meds & he doesn't under why the proof that he's a diabetic cannot be submitted to [facility]. Call back # [phone]

## 2022-10-20 NOTE — Telephone Encounter (Signed)
The dose of Toprol was increased to 100 mg once daily in February and that is the correct dose.  This can be refilled but the patient needs a follow-up appointment in our office within 1 month.

## 2022-10-21 NOTE — Telephone Encounter (Signed)
Nurse sent notification to pharmacy indicating pt's metoprolol was increased to 100 mg daily in Feb, 2024.

## 2022-11-16 ENCOUNTER — Other Ambulatory Visit: Payer: Self-pay | Admitting: Family Medicine

## 2022-11-16 ENCOUNTER — Other Ambulatory Visit: Payer: Self-pay | Admitting: Internal Medicine

## 2022-11-16 DIAGNOSIS — E785 Hyperlipidemia, unspecified: Secondary | ICD-10-CM

## 2022-11-16 DIAGNOSIS — E1129 Type 2 diabetes mellitus with other diabetic kidney complication: Secondary | ICD-10-CM

## 2022-11-16 DIAGNOSIS — E1159 Type 2 diabetes mellitus with other circulatory complications: Secondary | ICD-10-CM

## 2022-11-16 NOTE — Telephone Encounter (Signed)
Please advise on refill request.  Last seen by Dr. Okey Dupre on 05/05/22 with plan to f/u in 3 months.  Furosemide refill request is for QD dosing.  Never filled here before and entered as PRN.  NS 6/26 appt no future appt

## 2022-11-17 ENCOUNTER — Telehealth: Payer: Self-pay | Admitting: Internal Medicine

## 2022-11-17 NOTE — Telephone Encounter (Signed)
-----   Message from Nurse Kathrene Alu sent at 11/17/2022 11:48 AM EDT ----- Please schedule pt for a follow up appointment with Dr. Okey Dupre  Thanks

## 2022-11-17 NOTE — Telephone Encounter (Signed)
Lasix 40 mg daily PRN sent to requested pharmacy as advised by Dr. Okey Dupre. Message sent to scheduling to arrange appointment date and time.

## 2022-11-17 NOTE — Telephone Encounter (Signed)
Please refill furosemide 40 mg p.o. daily as needed for edema/weight gain.  Alvin Phillips should follow-up with [place] at his earliest convenience.

## 2022-11-17 NOTE — Telephone Encounter (Signed)
E-scribed refills.  Plz schedule CPE and fasting lab (no food/drink- except water and/or blk coffee 5 hrs prior) visits for additional refills.  

## 2022-11-17 NOTE — Telephone Encounter (Signed)
Not able to leave voicemail to schedule follow up appt

## 2022-11-29 ENCOUNTER — Other Ambulatory Visit: Payer: Self-pay | Admitting: Family Medicine

## 2022-11-29 DIAGNOSIS — N529 Male erectile dysfunction, unspecified: Secondary | ICD-10-CM

## 2022-12-07 ENCOUNTER — Other Ambulatory Visit (HOSPITAL_COMMUNITY): Payer: Self-pay

## 2022-12-07 ENCOUNTER — Telehealth: Payer: Self-pay

## 2022-12-07 NOTE — Telephone Encounter (Signed)
Pharmacy Patient Advocate Encounter  Received notification from [facility] that Prior Authorization for ENTRESTO has been APPROVED from 12/07/22 to 12/07/23. Ran test claim, Copay is $250. This test claim was processed through [facility]- copay amounts may vary at other pharmacies due to pharmacy/plan contracts, or as the patient moves through the different stages of their insurance plan.

## 2022-12-07 NOTE — Telephone Encounter (Signed)
Pharmacy Patient Advocate Encounter   Received notification from CoverMyMeds that prior authorization for ENTRESTO is required/requested.   Insurance verification completed.   The patient is insured through [facility] .   Per test claim: PA required; PA submitted to [facility] via CoverMyMeds Key/confirmation #/EOC [facility] Status is pending

## 2023-01-02 ENCOUNTER — Other Ambulatory Visit: Payer: Self-pay | Admitting: Family Medicine

## 2023-01-02 ENCOUNTER — Telehealth: Payer: Self-pay

## 2023-01-02 DIAGNOSIS — R3129 Other microscopic hematuria: Secondary | ICD-10-CM

## 2023-01-02 DIAGNOSIS — E538 Deficiency of other specified B group vitamins: Secondary | ICD-10-CM

## 2023-01-02 DIAGNOSIS — E1169 Type 2 diabetes mellitus with other specified complication: Secondary | ICD-10-CM

## 2023-01-02 NOTE — Telephone Encounter (Signed)
Received fax from pharmacy that Trulicity needs prior auth. Please advise.

## 2023-01-04 ENCOUNTER — Other Ambulatory Visit (INDEPENDENT_AMBULATORY_CARE_PROVIDER_SITE_OTHER): Payer: 59

## 2023-01-04 DIAGNOSIS — E538 Deficiency of other specified B group vitamins: Secondary | ICD-10-CM | POA: Diagnosis not present

## 2023-01-04 DIAGNOSIS — E785 Hyperlipidemia, unspecified: Secondary | ICD-10-CM

## 2023-01-04 DIAGNOSIS — E1169 Type 2 diabetes mellitus with other specified complication: Secondary | ICD-10-CM | POA: Diagnosis not present

## 2023-01-04 DIAGNOSIS — R3129 Other microscopic hematuria: Secondary | ICD-10-CM | POA: Diagnosis not present

## 2023-01-04 LAB — COMPREHENSIVE METABOLIC PANEL
ALT: 19 U/L (ref 0–53)
AST: 14 U/L (ref 0–37)
Albumin: 4.2 g/dL (ref 3.5–5.2)
Alkaline Phosphatase: 78 U/L (ref 39–117)
BUN: 13 mg/dL (ref 6–23)
CO2: 31 meq/L (ref 19–32)
Calcium: 9.2 mg/dL (ref 8.4–10.5)
Chloride: 99 meq/L (ref 96–112)
Creatinine, Ser: 0.83 mg/dL (ref 0.40–1.50)
GFR: 107.06 mL/min (ref >60.00)
Glucose, Bld: 297 mg/dL — ABNORMAL HIGH (ref 70–99)
Potassium: 4.4 meq/L (ref 3.5–5.1)
Sodium: 138 meq/L (ref 135–145)
Total Bilirubin: 0.5 mg/dL (ref 0.2–1.2)
Total Protein: 7 g/dL (ref 6.0–8.3)

## 2023-01-04 LAB — URINALYSIS, ROUTINE W REFLEX MICROSCOPIC
Bilirubin Urine: NEGATIVE
Hgb urine dipstick: NEGATIVE
Ketones, ur: NEGATIVE
Leukocytes,Ua: NEGATIVE
Nitrite: NEGATIVE
RBC / HPF: NONE SEEN (ref 0–?)
Specific Gravity, Urine: 1.02 (ref 1.000–1.030)
Total Protein, Urine: NEGATIVE
Urine Glucose: >=1000 — AB
Urobilinogen, UA: 0.2 (ref 0.0–1.0)
pH: 6.5 (ref 5.0–8.0)

## 2023-01-04 LAB — LIPID PANEL
Cholesterol: 152 mg/dL (ref 0–200)
HDL: 28.2 mg/dL — ABNORMAL LOW (ref >39.00)
LDL Cholesterol: 62 mg/dL (ref 0–99)
NonHDL: 124.25
Total CHOL/HDL Ratio: 5
Triglycerides: 312 mg/dL — ABNORMAL HIGH (ref 0.0–149.0)
VLDL: 62.4 mg/dL — ABNORMAL HIGH (ref 0.0–40.0)

## 2023-01-04 LAB — CBC WITH DIFFERENTIAL/PLATELET
Basophils Absolute: 0.1 10*3/uL (ref 0.0–0.1)
Basophils Relative: 2 % (ref 0.0–3.0)
Eosinophils Absolute: 0.2 10*3/uL (ref 0.0–0.7)
Eosinophils Relative: 2.2 % (ref 0.0–5.0)
HCT: 41.7 % (ref 39.0–52.0)
Hemoglobin: 13.4 g/dL (ref 13.0–17.0)
Lymphocytes Relative: 36.6 % (ref 12.0–46.0)
Lymphs Abs: 2.6 10*3/uL (ref 0.7–4.0)
MCHC: 32 g/dL (ref 30.0–36.0)
MCV: 90.7 fL (ref 78.0–100.0)
Monocytes Absolute: 0.7 10*3/uL (ref 0.1–1.0)
Monocytes Relative: 9.4 % (ref 3.0–12.0)
Neutro Abs: 3.5 10*3/uL (ref 1.4–7.7)
Neutrophils Relative %: 49.8 % (ref 43.0–77.0)
Platelets: 364 10*3/uL (ref 150.0–400.0)
RBC: 4.6 Mil/uL (ref 4.22–5.81)
RDW: 13.5 % (ref 11.5–15.5)
WBC: 7.1 10*3/uL (ref 4.0–10.5)

## 2023-01-04 LAB — MICROALBUMIN / CREATININE URINE RATIO
Creatinine,U: 91.7 mg/dL
Microalb Creat Ratio: 11.5 mg/g (ref 0.0–30.0)
Microalb, Ur: 10.6 mg/dL — ABNORMAL HIGH (ref 0.0–1.9)

## 2023-01-04 LAB — VITAMIN B12: Vitamin B-12: 656 pg/mL (ref 211–911)

## 2023-01-04 LAB — TSH: TSH: 0.8 u[IU]/mL (ref 0.35–5.50)

## 2023-01-04 LAB — HEMOGLOBIN A1C: Hgb A1c MFr Bld: 11.3 % — ABNORMAL HIGH (ref 4.6–6.5)

## 2023-01-05 ENCOUNTER — Other Ambulatory Visit (HOSPITAL_COMMUNITY): Payer: Self-pay

## 2023-01-05 NOTE — Telephone Encounter (Signed)
Per test claim, medication is covered, refill is too soon and will fill again 01/18/2023

## 2023-01-11 ENCOUNTER — Encounter: Payer: Self-pay | Admitting: Family Medicine

## 2023-01-11 ENCOUNTER — Ambulatory Visit: Payer: 59 | Admitting: Family Medicine

## 2023-01-11 VITALS — BP 138/78 | HR 80 | Temp 98.0°F | Ht 69.0 in | Wt 357.2 lb

## 2023-01-11 DIAGNOSIS — I152 Hypertension secondary to endocrine disorders: Secondary | ICD-10-CM

## 2023-01-11 DIAGNOSIS — I428 Other cardiomyopathies: Secondary | ICD-10-CM

## 2023-01-11 DIAGNOSIS — I456 Pre-excitation syndrome: Secondary | ICD-10-CM | POA: Diagnosis not present

## 2023-01-11 DIAGNOSIS — R21 Rash and other nonspecific skin eruption: Secondary | ICD-10-CM

## 2023-01-11 DIAGNOSIS — G4733 Obstructive sleep apnea (adult) (pediatric): Secondary | ICD-10-CM

## 2023-01-11 DIAGNOSIS — E1129 Type 2 diabetes mellitus with other diabetic kidney complication: Secondary | ICD-10-CM

## 2023-01-11 DIAGNOSIS — I5022 Chronic systolic (congestive) heart failure: Secondary | ICD-10-CM

## 2023-01-11 DIAGNOSIS — E1159 Type 2 diabetes mellitus with other circulatory complications: Secondary | ICD-10-CM | POA: Diagnosis not present

## 2023-01-11 DIAGNOSIS — L602 Onychogryphosis: Secondary | ICD-10-CM

## 2023-01-11 DIAGNOSIS — E538 Deficiency of other specified B group vitamins: Secondary | ICD-10-CM

## 2023-01-11 DIAGNOSIS — Z0001 Encounter for general adult medical examination with abnormal findings: Secondary | ICD-10-CM | POA: Diagnosis not present

## 2023-01-11 DIAGNOSIS — E1169 Type 2 diabetes mellitus with other specified complication: Secondary | ICD-10-CM

## 2023-01-11 DIAGNOSIS — E785 Hyperlipidemia, unspecified: Secondary | ICD-10-CM

## 2023-01-11 DIAGNOSIS — R809 Proteinuria, unspecified: Secondary | ICD-10-CM

## 2023-01-11 DIAGNOSIS — K219 Gastro-esophageal reflux disease without esophagitis: Secondary | ICD-10-CM

## 2023-01-11 MED ORDER — ATORVASTATIN CALCIUM 20 MG PO TABS: 20.0000 mg | ORAL_TABLET | Freq: Every day | ORAL | 4 refills | Status: DC

## 2023-01-11 MED ORDER — TRULICITY 4.5 MG/0.5ML ~~LOC~~ SOAJ: 4.5000 mg | SUBCUTANEOUS | 11 refills | Status: DC

## 2023-01-11 MED ORDER — PANTOPRAZOLE SODIUM 20 MG PO TBEC: 20.0000 mg | DELAYED_RELEASE_TABLET | Freq: Every day | ORAL | 5 refills | Status: DC

## 2023-01-11 MED ORDER — BLOOD GLUCOSE MONITORING SUPPL DEVI: 1.0000 | Freq: Three times a day (TID) | 0 refills | Status: AC

## 2023-01-11 MED ORDER — AMLODIPINE BESYLATE 5 MG PO TABS
5.0000 mg | ORAL_TABLET | Freq: Every day | ORAL | 4 refills | Status: DC
Start: 2023-01-11 — End: 2024-01-31

## 2023-01-11 MED ORDER — BLOOD GLUCOSE TEST VI STRP: 1.0000 | ORAL_STRIP | Freq: Three times a day (TID) | 0 refills | Status: AC

## 2023-01-11 MED ORDER — LANCETS MISC. MISC: 1.0000 | Freq: Three times a day (TID) | 0 refills | Status: DC

## 2023-01-11 MED ORDER — GLIMEPIRIDE 2 MG PO TABS: 2.0000 mg | ORAL_TABLET | Freq: Every day | ORAL | 4 refills | Status: DC

## 2023-01-11 MED ORDER — B-12 1000 MCG SL SUBL: 1.0000 | SUBLINGUAL_TABLET | SUBLINGUAL | Status: DC

## 2023-01-11 MED ORDER — LANCET DEVICE MISC: 1.0000 | Freq: Three times a day (TID) | 0 refills | Status: AC

## 2023-01-11 MED ORDER — TRIAMCINOLONE ACETONIDE 0.1 % EX CREA: 1.0000 | TOPICAL_CREAM | Freq: Two times a day (BID) | CUTANEOUS | 0 refills | Status: DC

## 2023-01-11 NOTE — Patient Instructions (Addendum)
Call cardiology to reschedule follow up appointment  Ask for diabetic eye exam with eye doctor Try steroid cream for skin rash to right leg sent to pharmacy.  Increase trulicity to 4.5mg  weekly - new dose sent to pharmacy.  Glucometer prescription sent to pharmacy.  Try freestyle libre 3 sample provided today  Return in 4 months for diabetes follow up visit   We will refer you to diabetes classes at [facility] We will refer you to foot doctor.

## 2023-01-11 NOTE — Assessment & Plan Note (Signed)
Preventative protocols reviewed and updated unless pt declined. Discussed healthy diet and lifestyle.  

## 2023-01-11 NOTE — Progress Notes (Unsigned)
Ph: [phone] Fax: [phone]   Patient ID: Alvin Phillips, male    DOB: June 05, 1980, 42 y.o.   MRN: [id number]  This visit was conducted in person.  BP 138/78   Pulse 80   Temp 98 F (36.7 C) (Oral)   Ht 5\' 9"  (1.753 m)   Wt (!) 357 lb 4 oz (162 kg)   SpO2 97%   BMI 52.76 kg/m    CC: CPE Subjective:   HPI: Alvin Phillips is a 42 y.o. male presenting on 01/11/2023 for Annual Exam   New job, new insurance.  Just got engaged.  Switching pharmacies - to [facility].   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 Last seen 04/2022, last saw cardiology 04/2022, last saw EP Dr Ladona Ridgel 2022. Missed cardiology appt 08/2022 - encouraged he call and reschedule.   Avoiding SGLT2i in h/o scrotal infections, balanitis, hidradenitis.    Difficulty difficulty getting GLP1RA covered, difficulty with cost, difficulty with nationwide shortage over the past 6 months. He has been very frustrated with this experience.   H/o intolerance to metformin IR and XR formulations (nausea). He continues glimepiride 1mg  daily. Currently on trulicity 3mg  weekly, tolerating well without nausea, constipation, diarrhea or epigastric pain.   Dry scaly dark itchy rash to R lower anterior leg, present for 1-2 yrs. No other rash like this. Treating with neosporin and lotion without significant benefit.   Would like podiatry referral for toenail trimming in diabetic  Preventative: Colon cancer screening - not dye, no fmhx Prostate cancer screening - not due, no fmhx Lung cancer screening - not eligible  Flu shot - declined COVID shot - declined Td 01/2020 Pneumonia shot - declined Shingrix - not due Advanced directive discussion - not discussed Seat belt use discussed Sunscreen use discussed. No changing moles on skin. 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 Interim medical history since our last visit reviewed. Allergies and medications reviewed and updated. Outpatient Medications Prior to Visit  Medication Sig Dispense Refill   furosemide (LASIX) 40 MG tablet Take 1 tablet (40 mg total) by mouth daily as needed (for swelling or weight gain of 2 lbs overnight or 5 lbs in a week). 30 tablet 3   sacubitril-valsartan (ENTRESTO) 97-103 MG Take 1 tablet by mouth 2 (two) times daily. 60 tablet 11   sildenafil (VIAGRA) 100 MG tablet TAKE 1 TABLET BY MOUTH ONCE DAILY AS NEEDED FOR ERECTILE DYSFUNCTION 6 tablet 1   amLODipine (NORVASC) 5 MG tablet Take 1 tablet by mouth once daily 30 tablet 0   atorvastatin (LIPITOR) 20 MG tablet Take 1 tablet by mouth once daily 30 tablet 0   Blood Glucose Monitoring Suppl DEVI 1 each by Does not apply route in the morning, at noon, and at bedtime. May substitute to any manufacturer covered by patient's insurance. DX E11.69 1 each 0   Continuous Glucose Receiver (DEXCOM G7 RECEIVER) DEVI Use to check blood sugars. 1 each 3   Continuous Glucose Sensor (DEXCOM G7  SENSOR) MISC Apply every 10 days to check blood sugars. 3 each 3   Dulaglutide (TRULICITY) 1.5 MG/0.5ML SOPN Inject 3 mg into the skin once a week. 4 mL 11   glimepiride (AMARYL) 1 MG tablet TAKE 2 TABLETS BY MOUTH ONCE DAILY WITH BREAKFAST 60 tablet 0   Lancets (ONETOUCH ULTRASOFT) lancets Use as instructed 100 each 5   pantoprazole (PROTONIX) 20 MG tablet TAKE 1 TABLET BY MOUTH ONCE DAILY AS NEEDED FOR  HEARTBURN  OR  INDIGESTION 30 tablet 5   metoprolol succinate (TOPROL-XL) 100 MG 24 hr tablet Take 1 tablet (100 mg  total) by mouth daily. (Patient not taking: Reported on 01/11/2023) 90 tablet 3   cephALEXin (KEFLEX) 500 MG capsule Take 1 capsule (500 mg total) by mouth 3 (three) times daily. 30 capsule 0   Cyanocobalamin (B-12) 1000 MCG SUBL Place 1 tablet under the tongue daily. (Patient not taking: Reported on 01/11/2023)     No facility-administered medications prior to visit.     Per HPI unless specifically indicated in ROS section below Review of Systems  Constitutional:  Positive for appetite change (increased). Negative for activity change, chills, fatigue, fever and unexpected weight change.  HENT:  Negative for hearing loss.   Eyes:  Negative for visual disturbance.  Respiratory:  Negative for cough, chest tightness, shortness of breath and wheezing.   Cardiovascular:  Negative for chest pain, palpitations and leg swelling.  Gastrointestinal:  Positive for nausea (in am). Negative for abdominal distention, abdominal pain, blood in stool, constipation, diarrhea and vomiting.       R flank discomfort  Genitourinary:  Negative for difficulty urinating and hematuria.  Musculoskeletal:  Negative for arthralgias, myalgias and neck pain.  Skin:  Negative for rash.  Neurological:  Negative for dizziness, seizures, syncope and headaches.  Hematological:  Negative for adenopathy. Does not bruise/bleed easily.  Psychiatric/Behavioral:  Negative for dysphoric mood. The patient is not nervous/anxious.     Objective:  BP 138/78   Pulse 80   Temp 98 F (36.7 C) (Oral)   Ht 5\' 9"  (1.753 m)   Wt (!) 357 lb 4 oz (162 kg)   SpO2 97%   BMI 52.76 kg/m   Wt Readings from Last 3 Encounters:  01/11/23 (!) 357 lb 4 oz (162 kg)  09/19/22 (!) 355 lb (161 kg)  05/05/22 (!) 360 lb 6 oz (163.5 kg)      Physical Exam Vitals and nursing note reviewed.  Constitutional:      General: He is not in acute distress.    Appearance: Normal appearance. He is well-developed. He is not ill-appearing.  HENT:      Head: Normocephalic and atraumatic.     Right Ear: Hearing, tympanic membrane, ear canal and external ear normal.     Left Ear: Hearing, tympanic membrane, ear canal and external ear normal.     Nose: Nose normal.     Mouth/Throat:     Mouth: Mucous membranes are moist.     Pharynx: Oropharynx is clear. No oropharyngeal exudate or posterior oropharyngeal erythema.  Eyes:     General: No scleral icterus.    Extraocular Movements: Extraocular movements intact.     Conjunctiva/sclera: Conjunctivae normal.     Pupils: Pupils are equal, round, and reactive to light.  Neck:     Thyroid: No thyroid mass or thyromegaly.  Cardiovascular:     Rate and Rhythm: Normal rate and regular rhythm.     Pulses: Normal pulses.  Radial pulses are 2+ on the right side and 2+ on the left side.     Heart sounds: Normal heart sounds. No murmur heard. Pulmonary:     Effort: Pulmonary effort is normal. No respiratory distress.     Breath sounds: Normal breath sounds. No wheezing, rhonchi or rales.  Abdominal:     General: Bowel sounds are normal. There is no distension.     Palpations: Abdomen is soft. There is no mass.     Tenderness: There is no abdominal tenderness. There is no guarding or rebound.     Hernia: No hernia is present.  Musculoskeletal:        General: Normal range of motion.     Cervical back: Normal range of motion and neck supple.     Right lower leg: No edema.     Left lower leg: No edema.  Lymphadenopathy:     Cervical: No cervical adenopathy.  Skin:    General: Skin is warm and dry.     Findings: No rash.  Neurological:     General: No focal deficit present.     Mental Status: He is alert and oriented to person, place, and time.  Psychiatric:        Mood and Affect: Mood normal.        Behavior: Behavior normal.        Thought Content: Thought content normal.        Judgment: Judgment normal.    Diabetic Foot Exam - Simple   Simple Foot Form Diabetic Foot exam was  performed with the following findings: Yes 01/11/2023 10:13 AM  Visual Inspection No deformities, no ulcerations, no other skin breakdown bilaterally: Yes Sensation Testing Intact to touch and monofilament testing bilaterally: Yes Pulse Check Posterior Tibialis and Dorsalis pulse intact bilaterally: Yes Comments No claudication Thickened nails        Results for orders placed or performed in visit on 01/04/23  TSH  Result Value Ref Range   TSH 0.80 0.35 - 5.50 uIU/mL  CBC with Differential/Platelet  Result Value Ref Range   WBC 7.1 4.0 - 10.5 K/uL   RBC 4.60 4.22 - 5.81 Mil/uL   Hemoglobin 13.4 13.0 - 17.0 g/dL   HCT 40.9 81.1 - 91.4 %   MCV 90.7 78.0 - 100.0 fl   MCHC 32.0 30.0 - 36.0 g/dL   RDW 78.2 95.6 - 21.3 %   Platelets 364.0 150.0 - 400.0 K/uL   Neutrophils Relative % 49.8 43.0 - 77.0 %   Lymphocytes Relative 36.6 12.0 - 46.0 %   Monocytes Relative 9.4 3.0 - 12.0 %   Eosinophils Relative 2.2 0.0 - 5.0 %   Basophils Relative 2.0 0.0 - 3.0 %   Neutro Abs 3.5 1.4 - 7.7 K/uL   Lymphs Abs 2.6 0.7 - 4.0 K/uL   Monocytes Absolute 0.7 0.1 - 1.0 K/uL   Eosinophils Absolute 0.2 0.0 - 0.7 K/uL   Basophils Absolute 0.1 0.0 - 0.1 K/uL  Urinalysis, Routine w reflex microscopic  Result Value Ref Range   Color, Urine YELLOW Yellow;Lt. Yellow;Straw;Dark Yellow;Amber;Green;Red;Brown   APPearance CLEAR Clear;Turbid;Slightly Cloudy;Cloudy   Specific Gravity, Urine 1.020 1.000 - 1.030   pH 6.5 5.0 - 8.0   Total Protein, Urine NEGATIVE Negative   Urine Glucose >=1000 (A) Negative   Ketones, ur NEGATIVE Negative   Bilirubin Urine NEGATIVE Negative   Hgb urine dipstick NEGATIVE Negative   Urobilinogen, UA 0.2 0.0 - 1.0   Leukocytes,Ua NEGATIVE Negative  Nitrite NEGATIVE Negative   WBC, UA 0-2/hpf 0-2/hpf   RBC / HPF none seen 0-2/hpf  Vitamin B12  Result Value Ref Range   Vitamin B-12 656 211 - 911 pg/mL  Comprehensive metabolic panel  Result Value Ref Range   Sodium 138  135 - 145 mEq/L   Potassium 4.4 3.5 - 5.1 mEq/L   Chloride 99 96 - 112 mEq/L   CO2 31 19 - 32 mEq/L   Glucose, Bld 297 (H) 70 - 99 mg/dL   BUN 13 6 - 23 mg/dL   Creatinine, Ser 2.59 0.40 - 1.50 mg/dL   Total Bilirubin 0.5 0.2 - 1.2 mg/dL   Alkaline Phosphatase 78 39 - 117 U/L   AST 14 0 - 37 U/L   ALT 19 0 - 53 U/L   Total Protein 7.0 6.0 - 8.3 g/dL   Albumin 4.2 3.5 - 5.2 g/dL   GFR 563.87 >56.43 mL/min   Calcium 9.2 8.4 - 10.5 mg/dL  Lipid panel  Result Value Ref Range   Cholesterol 152 0 - 200 mg/dL   Triglycerides 329.5 (H) 0.0 - 149.0 mg/dL   HDL 18.84 (L) >16.60 mg/dL   VLDL 63.0 (H) 0.0 - 16.0 mg/dL   LDL Cholesterol 62 0 - 99 mg/dL   Total CHOL/HDL Ratio 5    NonHDL 124.25   Microalbumin / creatinine urine ratio  Result Value Ref Range   Microalb, Ur 10.6 (H) 0.0 - 1.9 mg/dL   Creatinine,U 10.9 mg/dL   Microalb Creat Ratio 11.5 0.0 - 30.0 mg/g  Hemoglobin A1c  Result Value Ref Range   Hgb A1c MFr Bld 11.3 (H) 4.6 - 6.5 %    Assessment & Plan:   Problem List Items Addressed This Visit     Encounter for general adult medical examination with abnormal findings - Primary (Chronic)    Preventative protocols reviewed and updated unless pt declined. Discussed healthy diet and lifestyle.       Type 2 diabetes mellitus with other specified complication (HCC)   Relevant Medications   atorvastatin (LIPITOR) 20 MG tablet   glimepiride (AMARYL) 2 MG tablet   Dulaglutide (TRULICITY) 4.5 MG/0.5ML SOAJ   Other Relevant Orders   Ambulatory referral to Podiatry   Ambulatory referral to diabetic education   OSA (obstructive sleep apnea)   Hypertension associated with type 2 diabetes mellitus (HCC)   Relevant Medications   amLODipine (NORVASC) 5 MG tablet   atorvastatin (LIPITOR) 20 MG tablet   glimepiride (AMARYL) 2 MG tablet   Dulaglutide (TRULICITY) 4.5 MG/0.5ML SOAJ   WPW (Wolff-Parkinson-White syndrome)   Relevant Medications   amLODipine (NORVASC) 5 MG tablet    atorvastatin (LIPITOR) 20 MG tablet   Chronic HFrEF (heart failure with reduced ejection fraction) (HCC)   Relevant Medications   amLODipine (NORVASC) 5 MG tablet   atorvastatin (LIPITOR) 20 MG tablet   Other Relevant Orders   Ambulatory referral to diabetic education   Nonischemic cardiomyopathy (HCC)   Relevant Medications   amLODipine (NORVASC) 5 MG tablet   atorvastatin (LIPITOR) 20 MG tablet   Hyperlipidemia associated with type 2 diabetes mellitus (HCC)   Relevant Medications   amLODipine (NORVASC) 5 MG tablet   atorvastatin (LIPITOR) 20 MG tablet   glimepiride (AMARYL) 2 MG tablet   Dulaglutide (TRULICITY) 4.5 MG/0.5ML SOAJ   Other Relevant Orders   Ambulatory referral to diabetic education   Type 2 diabetes mellitus with microalbuminuria, without long-term current use of insulin (HCC)   Relevant Medications  atorvastatin (LIPITOR) 20 MG tablet   glimepiride (AMARYL) 2 MG tablet   Dulaglutide (TRULICITY) 4.5 MG/0.5ML SOAJ   Other Relevant Orders   Ambulatory referral to diabetic education   Low serum vitamin B12   Other Visit Diagnoses     Thickened nails       Relevant Orders   Ambulatory referral to Podiatry        Meds ordered this encounter  Medications   triamcinolone cream (KENALOG) 0.1 %    Sig: Apply 1 Application topically 2 (two) times daily. Apply to AA for max 10 days at a time.    Dispense:  45 g    Refill:  0   Blood Glucose Monitoring Suppl DEVI    Sig: 1 each by Does not apply route in the morning, at noon, and at bedtime. May substitute to any manufacturer covered by patient's insurance.    Dispense:  1 each    Refill:  0   Glucose Blood (BLOOD GLUCOSE TEST STRIPS) STRP    Sig: 1 each by In Vitro route in the morning, at noon, and at bedtime. May substitute to any manufacturer covered by patient's insurance.    Dispense:  100 strip    Refill:  0   Lancet Device MISC    Sig: 1 each by Does not apply route in the morning, at noon, and at  bedtime. May substitute to any manufacturer covered by patient's insurance.    Dispense:  1 each    Refill:  0   DISCONTD: Lancets Misc. MISC    Sig: 1 each by Does not apply route in the morning, at noon, and at bedtime. May substitute to any manufacturer covered by patient's insurance.    Dispense:  100 each    Refill:  0   Cyanocobalamin (B-12) 1000 MCG SUBL    Sig: Place 1 tablet under the tongue every Monday, Wednesday, and Friday.   amLODipine (NORVASC) 5 MG tablet    Sig: Take 1 tablet (5 mg total) by mouth daily.    Dispense:  90 tablet    Refill:  4   atorvastatin (LIPITOR) 20 MG tablet    Sig: Take 1 tablet (20 mg total) by mouth daily.    Dispense:  90 tablet    Refill:  4   glimepiride (AMARYL) 2 MG tablet    Sig: Take 1 tablet (2 mg total) by mouth daily with breakfast.    Dispense:  90 tablet    Refill:  4   pantoprazole (PROTONIX) 20 MG tablet    Sig: Take 1 tablet (20 mg total) by mouth daily.    Dispense:  30 tablet    Refill:  5   Dulaglutide (TRULICITY) 4.5 MG/0.5ML SOAJ    Sig: Inject 4.5 mg as directed once a week.    Dispense:  2 mL    Refill:  11    Note new dose    Orders Placed This Encounter  Procedures   Ambulatory referral to Podiatry    Referral Priority:   Routine    Referral Type:   Consultation    Referral Reason:   Specialty Services Required    Requested Specialty:   Podiatry    Number of Visits Requested:   1   Ambulatory referral to diabetic education    Referral Priority:   Routine    Referral Type:   Consultation    Referral Reason:   Specialty Services Required    Number of Visits  Requested:   1    Patient Instructions  Call cardiology to reschedule follow up appointment  Ask for diabetic eye exam with eye doctor Try steroid cream for skin rash to right leg sent to pharmacy.  Increase trulicity to 4.5mg  weekly - new dose sent to pharmacy.  Glucometer prescription sent to pharmacy.  Try freestyle libre 3 sample provided today   Return in 4 months for diabetes follow up visit   We will refer you to diabetes classes at [facility] We will refer you to foot doctor.   Follow up plan: Return in about 4 months (around 05/14/2023) for follow up visit.  Eustaquio Boyden, MD

## 2023-01-12 ENCOUNTER — Encounter: Payer: Self-pay | Admitting: Family Medicine

## 2023-01-12 DIAGNOSIS — R21 Rash and other nonspecific skin eruption: Secondary | ICD-10-CM | POA: Insufficient documentation

## 2023-01-12 NOTE — Assessment & Plan Note (Signed)
Most recently Umicroalb/Cr ratio normal - continue entresto.

## 2023-01-12 NOTE — Assessment & Plan Note (Signed)
Continue MWF oral replacement

## 2023-01-12 NOTE — Assessment & Plan Note (Addendum)
Encouraged ongoing weight loss efforts.  Increase GLP1RA to Trulicity 4.5mg  weekly.

## 2023-01-12 NOTE — Assessment & Plan Note (Signed)
Chronic, stable on current regimen including amlodipine 5mg , Entresto 97/103 BID.  He is unsure if he's taking toprol XL 100mg  - will verify at home.

## 2023-01-12 NOTE — Assessment & Plan Note (Addendum)
Appreciate cardiology care. Continue entresto and PRN lasix. - he will check on BB  Encouraged cards f/u as overdue.

## 2023-01-12 NOTE — Assessment & Plan Note (Addendum)
Chronically uncontrolled. He was frustrated with difficulty filling GLP1RA over the past 6 months. He reports more regular adherence to weekly trulicity 3mg  over the past several months. He is tolerating this well. Will increase to 4.5mg  weekly, continue glimepiride 2mg  daily. Metformin intolerance. Avoiding SGLT2i in h/o recurrent scrotal infections.  Discussed addition of basal insulin as likely next step.  Reviewed risks of uncontrolled diabetes.  Requests podiatry referral for nail care.  Agrees to DSME referral to [facility].  Glucometer/lancets/strips Rx sent to pharmacy Provided with 1 sample sensor for [employer] 3 to try.

## 2023-01-12 NOTE — Assessment & Plan Note (Addendum)
Chronic, continue atorvastatin daily. Goal LDL <100 in diabetic.  The 10-year ASCVD risk score (Arnett DK, et al., 2019) is: 14.2%   Values used to calculate the score:     Age: 42 years     Sex: Male     Is Non-Hispanic African American: Yes     Diabetic: Yes     Tobacco smoker: No     Systolic Blood Pressure: 138 mmHg     Is BP treated: Yes     HDL Cholesterol: 28.2 mg/dL     Total Cholesterol: 152 mg/dL

## 2023-01-12 NOTE — Assessment & Plan Note (Signed)
I don't believe he's using CPAP - will need to verify this.

## 2023-01-12 NOTE — Telephone Encounter (Signed)
Pt seen yesterday, I increased Trulicity to 4.5mg  weekly.  Does this change need new PA, or will previous PA suffice with change in dosing?  Thanks!

## 2023-01-12 NOTE — Assessment & Plan Note (Signed)
Continues management with PRN pantoprazole 20mg 

## 2023-01-12 NOTE — Assessment & Plan Note (Signed)
Has seen EP. Will verify if he's taking Toprol XL

## 2023-01-12 NOTE — Assessment & Plan Note (Signed)
Rx triamcinolone cream, rec regular moisturizing cream.  Update if not improving with treatment.

## 2023-01-13 ENCOUNTER — Other Ambulatory Visit (HOSPITAL_COMMUNITY): Payer: Self-pay

## 2023-01-16 ENCOUNTER — Other Ambulatory Visit (HOSPITAL_COMMUNITY): Payer: Self-pay

## 2023-01-18 ENCOUNTER — Other Ambulatory Visit: Payer: Self-pay | Admitting: Family Medicine

## 2023-01-19 ENCOUNTER — Other Ambulatory Visit (HOSPITAL_COMMUNITY): Payer: Self-pay

## 2023-01-19 NOTE — Telephone Encounter (Signed)
PA not needed-4.5mg  last filled on 10/23

## 2023-02-01 ENCOUNTER — Encounter: Payer: Self-pay | Admitting: Podiatry

## 2023-02-01 ENCOUNTER — Ambulatory Visit (INDEPENDENT_AMBULATORY_CARE_PROVIDER_SITE_OTHER): Payer: 59 | Admitting: Podiatry

## 2023-02-01 DIAGNOSIS — E119 Type 2 diabetes mellitus without complications: Secondary | ICD-10-CM

## 2023-02-01 DIAGNOSIS — M79675 Pain in left toe(s): Secondary | ICD-10-CM

## 2023-02-01 DIAGNOSIS — E1169 Type 2 diabetes mellitus with other specified complication: Secondary | ICD-10-CM | POA: Diagnosis not present

## 2023-02-01 DIAGNOSIS — B351 Tinea unguium: Secondary | ICD-10-CM | POA: Diagnosis not present

## 2023-02-01 DIAGNOSIS — M79674 Pain in right toe(s): Secondary | ICD-10-CM

## 2023-02-01 DIAGNOSIS — S90851A Superficial foreign body, right foot, initial encounter: Secondary | ICD-10-CM

## 2023-02-01 NOTE — Progress Notes (Signed)
  Subjective:  Patient ID: Alvin Phillips, male    DOB: May 21, 1980,  MRN: [id number]  Chief Complaint  Patient presents with   Diabetes    "I need my toenails cut.  I think I have an ingrown toenail.  I am having pain on the bottom of my foot.  I'm Diabetic."    42 y.o. male presents with the above complaint. History confirmed with patient.  A1c is still elevated.  His nails are thickened elongated causing pain and discomfort thinks he has an ingrown toenail.  Also has a spot on his right foot that is painful  Objective:  Physical Exam: warm, good capillary refill, no trophic changes or ulcerative lesions, normal DP and PT pulses, normal monofilament exam and normal sensory exam.  Onychomycosis x10.  Incurvated hallux nail borders.  Right plantar midfoot has a small hyperkeratotic lesion Assessment:   1. Foreign body in right foot, initial encounter   2. Pain due to onychomycosis of toenails of both feet      Plan:  Patient was evaluated and treated and all questions answered.  Patient educated on diabetes. Discussed proper diabetic foot care and discussed risks and complications of disease. Educated patient in depth on reasons to return to the office immediately should he/she discover anything concerning or new on the feet. All questions answered. Discussed proper shoes as well.   Discussed the etiology and treatment options for the condition in detail with the patient. Recommended debridement of the nails today. Sharp and mechanical debridement performed of all painful and mycotic nails today. Nails debrided in length and thickness using a nail nipper  to level of comfort. Discussed treatment options including appropriate shoe gear. Follow up as needed for painful nails.  We discussed that the ingrown toenails could be permanently removed with partial matricectomy but his A1c would need to be 7.5% or lower for this  The lesion on the plantar right forefoot was debrided and a small foreign  body was identified.  It appeared to be a hair splinter.  Complete excision was possible.  There were no signs of infection.  It was dressed with antibiotic ointment and a bandage.  I advised him if there are signs of infection that develop he should notify me immediately.    Return if symptoms worsen or fail to improve.

## 2023-02-22 ENCOUNTER — Ambulatory Visit: Payer: 59 | Admitting: Dietician

## 2023-03-14 ENCOUNTER — Other Ambulatory Visit: Payer: Self-pay | Admitting: Internal Medicine

## 2023-04-09 ENCOUNTER — Other Ambulatory Visit: Payer: Self-pay | Admitting: Internal Medicine

## 2023-04-10 NOTE — Telephone Encounter (Signed)
Unable to schedule no Voicemail.

## 2023-04-10 NOTE — Telephone Encounter (Signed)
Good Morning,  Could you please schedule this patient an overdue follow up appointment? The patient was last seen by Dr. Okey Dupre on 05-24-2022. Thank you so much.

## 2023-04-24 ENCOUNTER — Encounter: Payer: Self-pay | Admitting: Internal Medicine

## 2023-04-24 ENCOUNTER — Telehealth (INDEPENDENT_AMBULATORY_CARE_PROVIDER_SITE_OTHER): Payer: 59 | Admitting: Internal Medicine

## 2023-04-24 DIAGNOSIS — J988 Other specified respiratory disorders: Secondary | ICD-10-CM | POA: Diagnosis not present

## 2023-04-24 DIAGNOSIS — B9789 Other viral agents as the cause of diseases classified elsewhere: Secondary | ICD-10-CM | POA: Diagnosis not present

## 2023-04-24 MED ORDER — OSELTAMIVIR PHOSPHATE 75 MG PO CAPS: 75.0000 mg | ORAL_CAPSULE | Freq: Two times a day (BID) | ORAL | 0 refills | Status: DC

## 2023-04-24 MED ORDER — BENZONATATE 200 MG PO CAPS: 200.0000 mg | ORAL_CAPSULE | Freq: Three times a day (TID) | ORAL | 0 refills | Status: DC | PRN

## 2023-04-24 NOTE — Progress Notes (Signed)
Subjective:    Patient ID: Alvin Phillips, male    DOB: 1980-05-29, 43 y.o.   MRN: [id number]  HPI Video virtual visit due to respiratory infection Identification done Reviewed limitations and billing and he gave consent Participants--patient at home and I am in my office  Started feeling ill yesterday "My body started feeling bad" Throat raspy Light headache and chills Some cough--with occ dark phlegm Started nyquil last night--helped some Feels feverish--chills and hot Some SOB--mostly if lying down  Getting married this weekend and going to [place]  Current Outpatient Medications on File Prior to Visit  Medication Sig Dispense Refill   amLODipine (NORVASC) 5 MG tablet Take 1 tablet (5 mg total) by mouth daily. 90 tablet 4   atorvastatin (LIPITOR) 20 MG tablet Take 1 tablet (20 mg total) by mouth daily. 90 tablet 4   Blood Glucose Monitoring Suppl DEVI 1 each by Does not apply route in the morning, at noon, and at bedtime. May substitute to any manufacturer covered by patient's insurance. 1 each 0   Dulaglutide (TRULICITY) 4.5 MG/0.5ML SOAJ Inject 4.5 mg as directed once a week. 2 mL 11   furosemide (LASIX) 40 MG tablet Take 1 tablet (40 mg total) by mouth daily as needed (for swelling or weight gain of 2 lbs overnight or 5 lbs in a week). 30 tablet 3   glimepiride (AMARYL) 2 MG tablet Take 1 tablet (2 mg total) by mouth daily with breakfast. 90 tablet 4   metoprolol succinate (TOPROL-XL) 100 MG 24 hr tablet Take 1 tablet (100 mg total) by mouth daily. 90 tablet 3   pantoprazole (PROTONIX) 20 MG tablet Take 1 tablet (20 mg total) by mouth daily. 30 tablet 5   sacubitril-valsartan (ENTRESTO) 97-103 MG Take 1 tablet by mouth twice daily 60 tablet 1   sildenafil (VIAGRA) 100 MG tablet TAKE 1 TABLET BY MOUTH ONCE DAILY AS NEEDED FOR ERECTILE DYSFUNCTION 6 tablet 1   No current facility-administered medications on file prior to visit.    Allergies  Allergen Reactions   Metformin  And Related Nausea Only    Also to XR   Sulfa Antibiotics     Itching, rash    Past Medical History:  Diagnosis Date   Balanitis 03/27/2019   Carbuncle of scrotum 03/27/2019   Saw urology (MacDiarmid) - treating thigh ulcer with bactrim for possible MRSA.      CHF (congestive heart failure) (HCC)    Diabetes mellitus without complication (HCC) 07/10/2013   referred to DSME, did not show (04/2014)   GERD (gastroesophageal reflux disease)    HFrEF (heart failure with reduced ejection fraction) (HCC)    a. 11/2017 Echo: EF 20-25%, diff HK;  b. 02/2018 Echo: EF 30-35%, diff HK   Hypertension    Marijuana abuse    Morbidly obese (HCC) 03/24/2014   NICM (nonischemic cardiomyopathy) (HCC)    a. 11/2017 Echo: EF 20-25%, diff HK, mild MR, mildy dil LA, PASP ; b. 11/2017 Cath: nl cors. PCWP . CO/CI 4.8/1.79; c. 02/2018 Echo: EF 30-35%, diff HK, mildly dil LA, nl RV fxn. PASP .   OSA (obstructive sleep apnea) 10/29/2013   Tobacco abuse    Wolff-Parkinson-White (WPW) syndrome    a. 11/2017 Admitted w/ SVT @ 190 bpm; b. 12/2017 EPS w/RFCA: right mid septal accessory pathway <1cm from AV node. Ablation performed but developed recurrent WPW post-procedure and placed on Sotalol.    Past Surgical History:  Procedure Laterality Date   CARDIAC CATHETERIZATION  FRACTURE SURGERY Left 03/21/2000   fractured arm   HERNIA REPAIR  12/16/2013   umbilical   PVC ABLATION N/A 12/22/2017   Procedure: WPW  ABLATION;  Surgeon: Marinus Maw, MD;  Location: [facility];  Service: Cardiovascular;  Laterality: N/A;   RIGHT/LEFT HEART CATH AND CORONARY ANGIOGRAPHY N/A 12/18/2017   Procedure: RIGHT/LEFT HEART CATH AND CORONARY ANGIOGRAPHY;  Surgeon: Iran Ouch, MD;  Location: [facility] INVASIVE CV LAB;  Service: Cardiovascular;  Laterality: N/A;    Family History  Problem Relation Age of Onset   Diabetes Maternal Grandmother    Heart attack Maternal Grandmother 40   Diabetes  Paternal Grandfather     Social History   Socioeconomic History   Marital status: Single    Spouse name: Not on file   Number of children: 2   Years of education: Not on file   Highest education level: Not on file  Occupational History   Occupation: Advertising account executive: SELF-EMPLOYED    Comment: [employer]  Tobacco Use   Smoking status: Former    Current packs/day: 0.00    Average packs/day: 0.3 packs/day for 15.0 years (3.8 ttl pk-yrs)    Types: Cigarettes    Start date: 2004    Quit date: 2019    Years since quitting: 6.0   Smokeless tobacco: Never   Tobacco comments:    Patient has smoked 10 cigarettes within the last month. 05/05/2022  Vaping Use   Vaping status: Never Used  Substance and Sexual Activity   Alcohol use: Yes    Alcohol/week: 0.0 standard drinks of alcohol    Comment: Occassional beer, shots of liquor   Drug use: Yes    Frequency: 2.0 times per week    Types: Marijuana    Comment: smoked this morning 05/05/22   Sexual activity: Not on file  Other Topics Concern   Not on file  Social History Narrative   Lives with girlfriend Rondo Spittler grew up in [place]. He is currently living in [place]. [name], Jezewski). He breeds dogs Medical sales representative).   Occ: Teaching laboratory technician rep   Social Drivers of Corporate investment banker Strain: Not on file  Food Insecurity: Not on file  Transportation Needs: Not on file  Physical Activity: Not on file  Stress: Not on file  Social Connections: Not on file  Intimate Partner Violence: Not on file   Review of Systems No N/V Not much appetite but able to eat a little    Objective:   Physical Exam Constitutional:      Comments: Looks ill but not in distress  Pulmonary:     Effort: Pulmonary effort is normal. No respiratory distress.     Comments: Some noisy breathing---no overt wheezing (he thinks the noise is all the time) Neurological:      Mental Status: He is alert.            Assessment & Plan:

## 2023-04-24 NOTE — Assessment & Plan Note (Addendum)
Seems like the flu--but I can't test for this in virtual visit Analgesics  Tessalon for cough Will try empiric tamiflu since very likely this is the flu A that has been around  ER if worsens Likely will need to miss this week at work

## 2023-05-17 ENCOUNTER — Ambulatory Visit: Payer: 59 | Admitting: Family Medicine

## 2023-05-17 DIAGNOSIS — E1129 Type 2 diabetes mellitus with other diabetic kidney complication: Secondary | ICD-10-CM

## 2023-05-18 ENCOUNTER — Encounter: Payer: Self-pay | Admitting: Family Medicine

## 2023-05-26 ENCOUNTER — Encounter: Payer: Self-pay | Admitting: Family Medicine

## 2023-05-26 NOTE — Telephone Encounter (Signed)
Error

## 2023-06-14 IMAGING — CR DG CHEST 2V
1 series · 2 of 2 positions shown · non-contrast
Comparison: 12/14/2017

CLINICAL DATA: Hypertension

EXAM:
CHEST - 2 VIEW

[Series 1: w chest pa · 0.14mm/px · 2 of 2 slices shown]
[im 1/2]
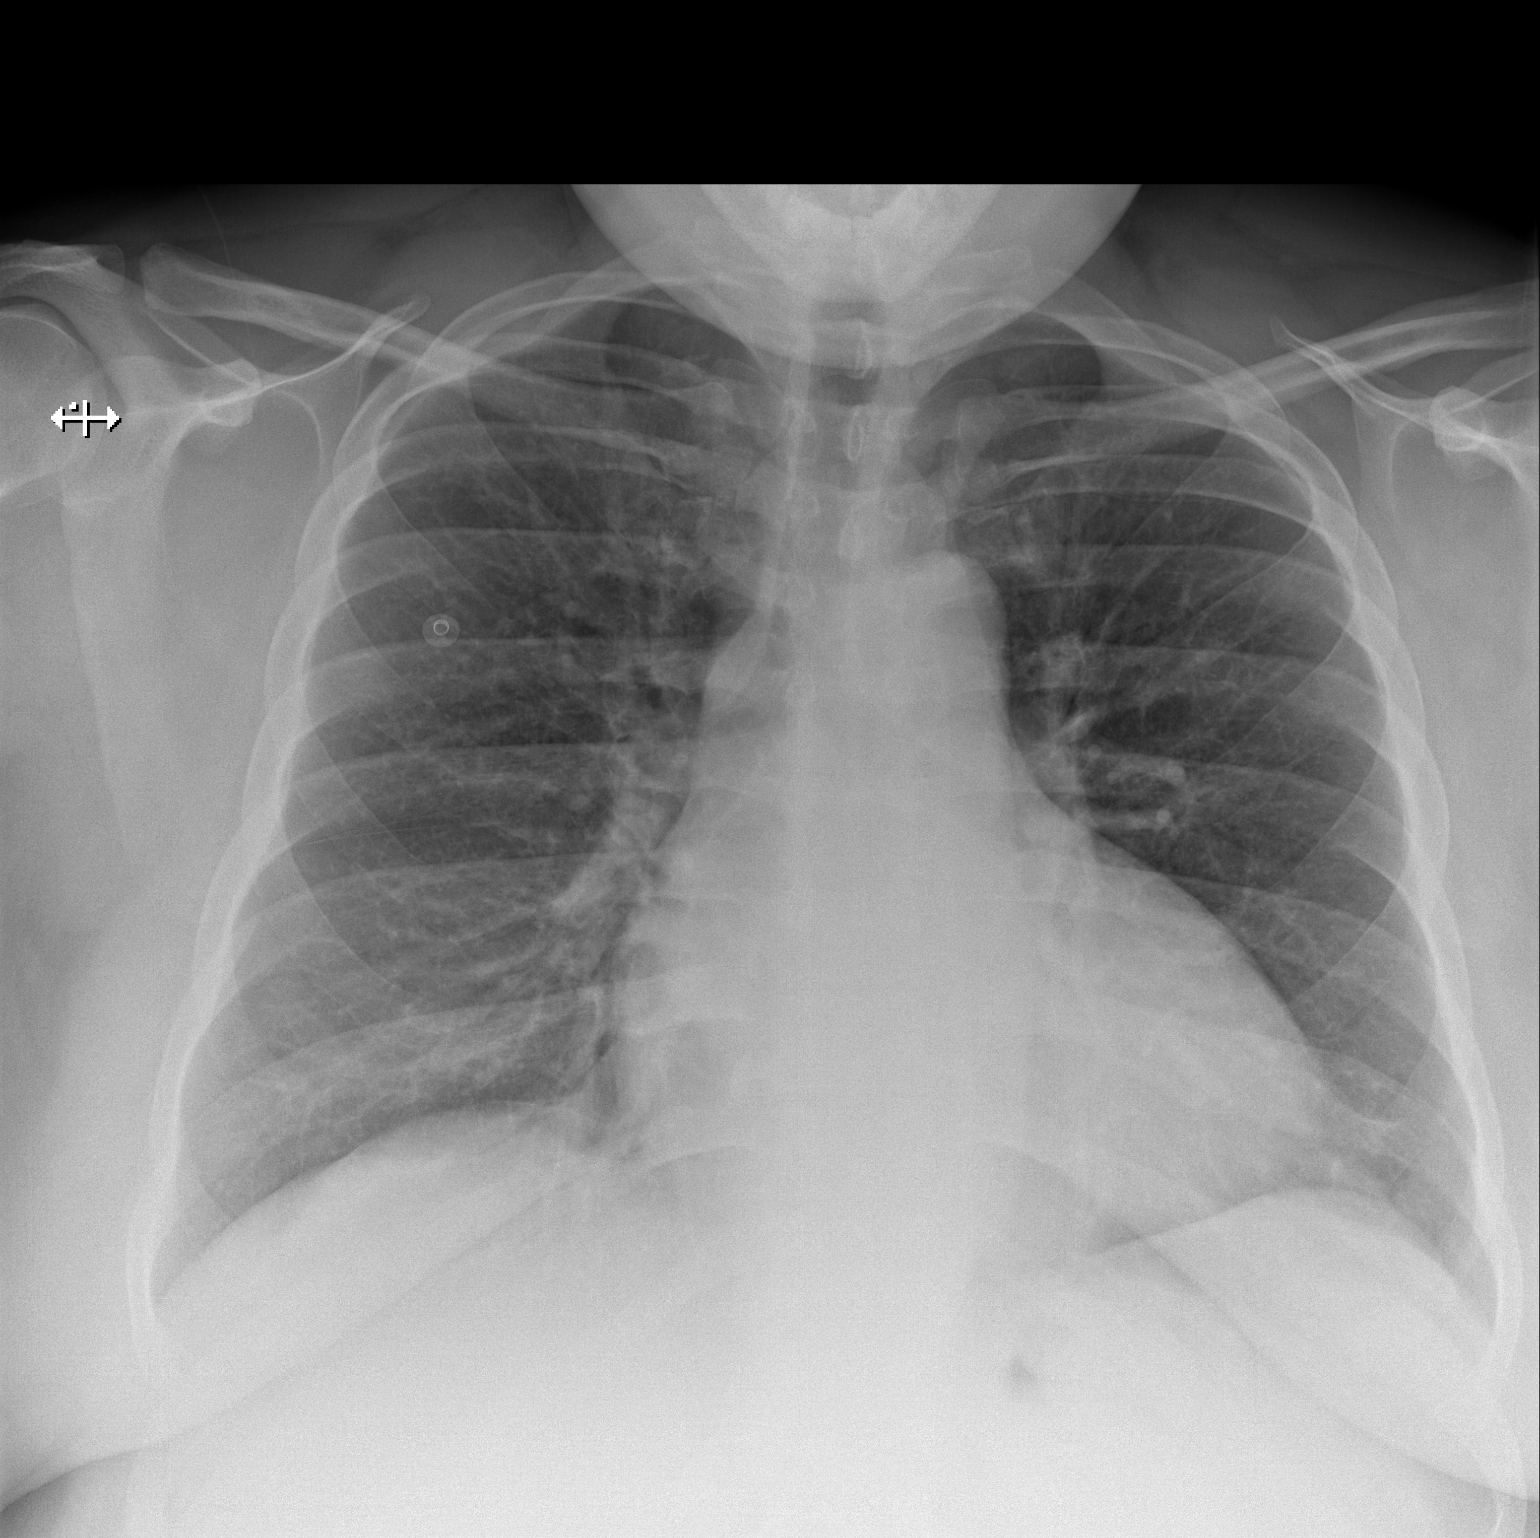
[im 2/2]
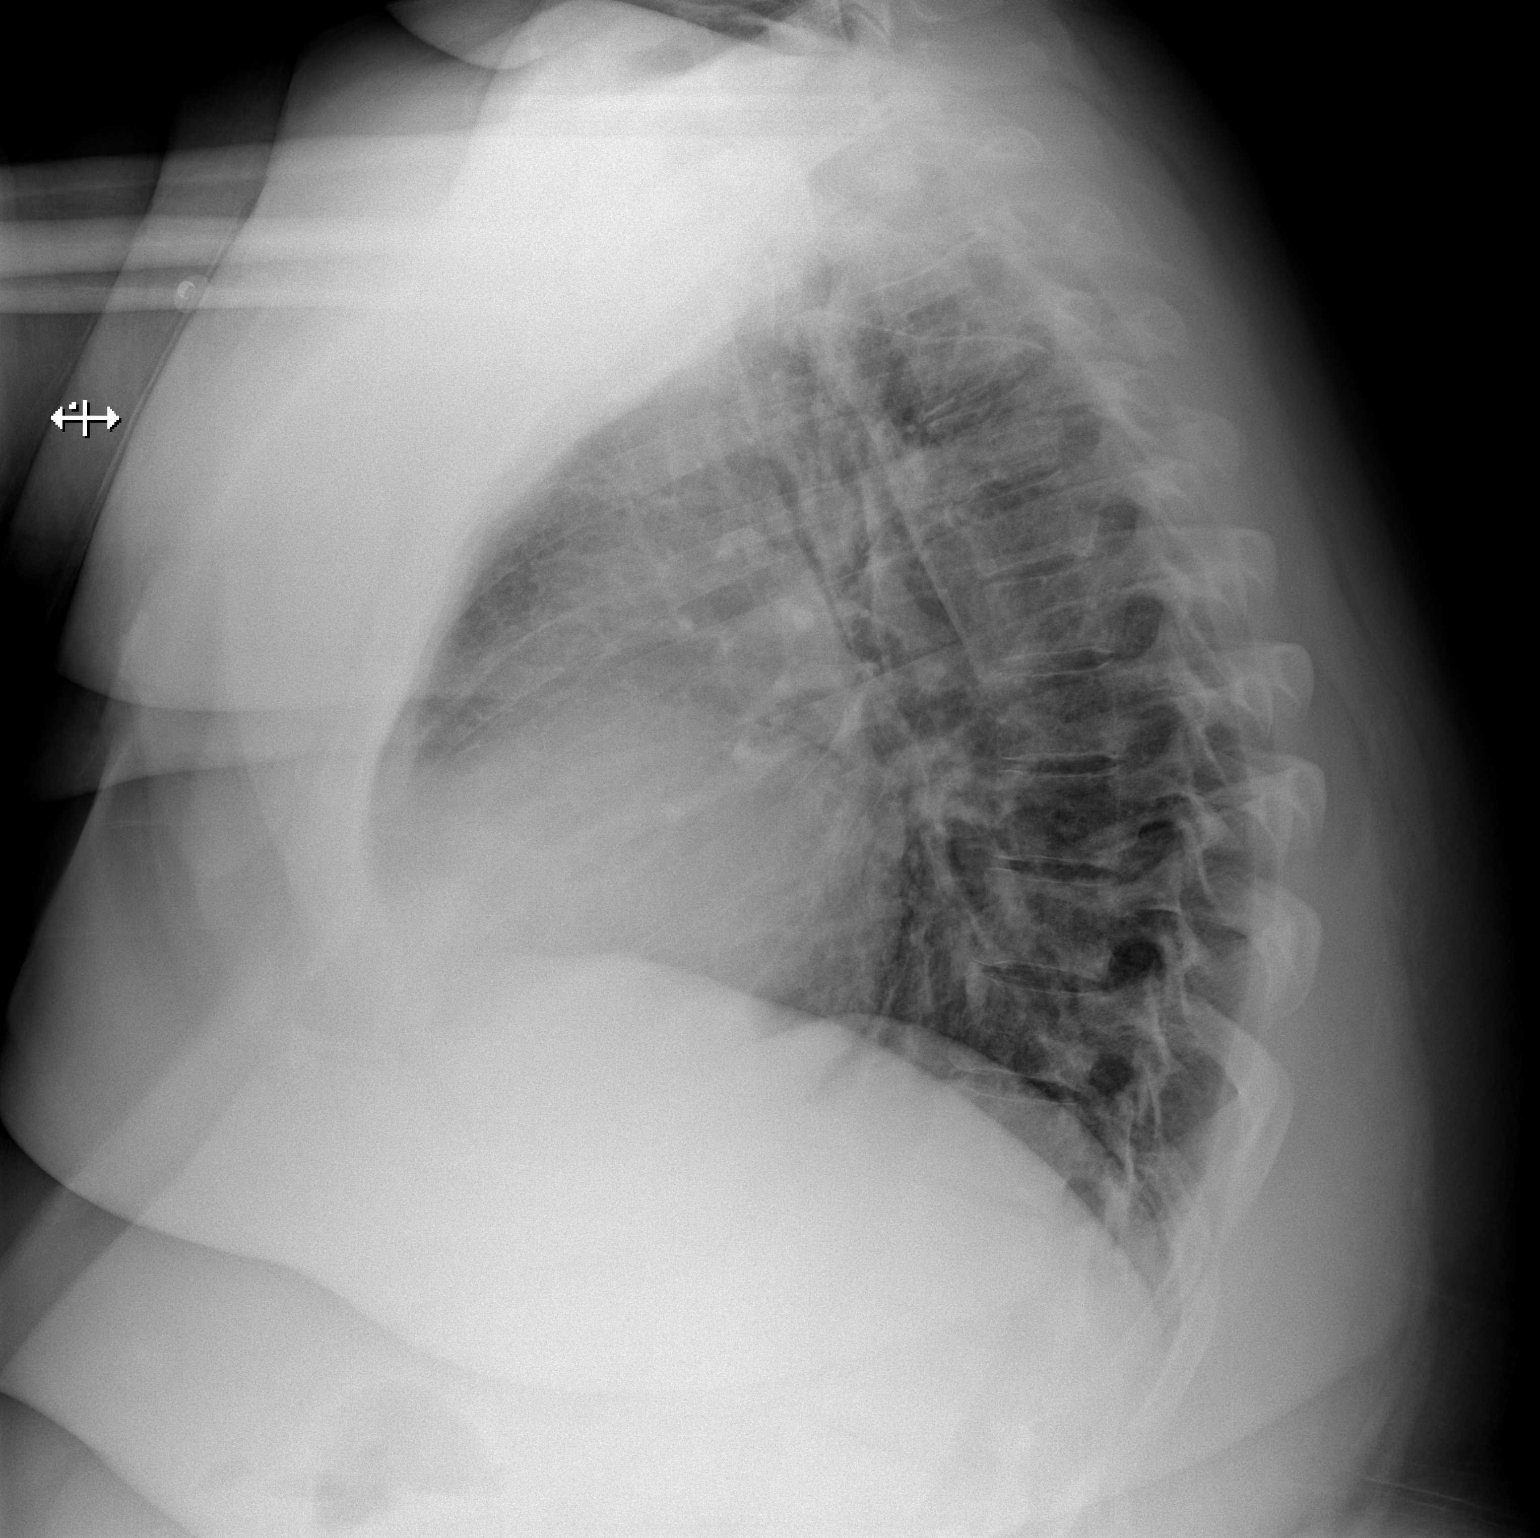

[2 of 2 positions shown; findings below may reference images not displayed]

FINDINGS: Heart and mediastinal contours are within normal limits. No focal
opacities or effusions. No acute bony abnormality.
IMPRESSION: No active cardiopulmonary disease.

## 2023-06-20 ENCOUNTER — Other Ambulatory Visit: Payer: Self-pay | Admitting: Internal Medicine

## 2023-07-12 ENCOUNTER — Telehealth: Payer: Self-pay | Admitting: Family Medicine

## 2023-07-12 ENCOUNTER — Ambulatory Visit: Admitting: Family Medicine

## 2023-07-12 ENCOUNTER — Encounter: Payer: Self-pay | Admitting: Family Medicine

## 2023-07-12 ENCOUNTER — Encounter: Payer: Self-pay | Admitting: Pharmacist

## 2023-07-12 VITALS — BP 154/100 | HR 86 | Temp 98.1°F | Ht 69.0 in | Wt 359.0 lb

## 2023-07-12 DIAGNOSIS — R809 Proteinuria, unspecified: Secondary | ICD-10-CM

## 2023-07-12 DIAGNOSIS — E1169 Type 2 diabetes mellitus with other specified complication: Secondary | ICD-10-CM | POA: Diagnosis not present

## 2023-07-12 DIAGNOSIS — E1159 Type 2 diabetes mellitus with other circulatory complications: Secondary | ICD-10-CM

## 2023-07-12 DIAGNOSIS — Z794 Long term (current) use of insulin: Secondary | ICD-10-CM

## 2023-07-12 DIAGNOSIS — I428 Other cardiomyopathies: Secondary | ICD-10-CM

## 2023-07-12 DIAGNOSIS — E1129 Type 2 diabetes mellitus with other diabetic kidney complication: Secondary | ICD-10-CM

## 2023-07-12 DIAGNOSIS — E785 Hyperlipidemia, unspecified: Secondary | ICD-10-CM

## 2023-07-12 DIAGNOSIS — I456 Pre-excitation syndrome: Secondary | ICD-10-CM

## 2023-07-12 DIAGNOSIS — I5022 Chronic systolic (congestive) heart failure: Secondary | ICD-10-CM

## 2023-07-12 DIAGNOSIS — I152 Hypertension secondary to endocrine disorders: Secondary | ICD-10-CM

## 2023-07-12 LAB — POCT GLYCOSYLATED HEMOGLOBIN (HGB A1C): Hemoglobin A1C: 13.2 % — AB (ref 4.0–5.6)

## 2023-07-12 MED ORDER — PANTOPRAZOLE SODIUM 20 MG PO TBEC: 20.0000 mg | DELAYED_RELEASE_TABLET | Freq: Every day | ORAL | 5 refills | Status: DC | PRN

## 2023-07-12 MED ORDER — FREESTYLE LIBRE 3 PLUS SENSOR MISC: 11 refills | Status: AC

## 2023-07-12 MED ORDER — TOUJEO SOLOSTAR 300 UNIT/ML ~~LOC~~ SOPN: 15.0000 [IU] | PEN_INJECTOR | Freq: Every day | SUBCUTANEOUS | 1 refills | Status: DC

## 2023-07-12 NOTE — Assessment & Plan Note (Addendum)
 Refer back to cardiology Uses lasix  PRN Continues daily entresto . Ran out of Toprol  XL - he will restart this

## 2023-07-12 NOTE — Assessment & Plan Note (Signed)
 Avoiding SGLT2i in h/o recurrent boils.  Start insulin  as per below.

## 2023-07-12 NOTE — Assessment & Plan Note (Signed)
 Chronic poor control, deteriorated despite full dose Trulicity . Metformin  intolerance.  Endorses dietary indiscretions. Nutrition and diabetes handout provided. Refer to diabetes education /nutrition. Stop glimepiride . Start daily insulin  Toujeo  300mg /mL 15u daily, slowly titrate by 3u every 3 days if fasting sugar >150. Recommend he return with pharmacy to facilitate insulin  titration. Start CGM [employer] 3 plus. Sample provided today.  Refer to endocrinology.

## 2023-07-12 NOTE — Progress Notes (Signed)
 Ph: [phone] Fax: [phone]   Patient ID: Alvin Phillips, male    DOB: 01/22/1981, 43 y.o.   MRN: [id number]  This visit was conducted in person.  BP (!) 154/100   Pulse 86   Temp 98.1 F (36.7 C) (Oral)   Ht 5\' 9"  (1.753 m)   Wt (!) 359 lb (162.8 kg)   SpO2 96%   BMI 53.02 kg/m    CC: 44mo DM f/u visit  Subjective:   HPI: Alvin Phillips is a 43 y.o. male presenting on 07/12/2023 for Medical Management of Chronic Issues (Here for 4 mo DM f/u.)   Married 04/2023.  Wife expecting new baby in 2 weeks.   Chronic HFrEF due to NICM, WPW syndrome, OSA - last saw cardiology 04/2022 - discussed overdue for f/u - will re-refer.   HTN - bp elevated despite amlodipine  5mg  daily and entresto . Uses lasix  PRN - none recently. He ran out of Toprol  XL - see above.   ED - sildenafil  ineffective - discussed anticipate due to hyperglycemia effect.   DM - does not regularly check sugars. Compliant with antihyperglycemic regimen which includes: trulicity  4.5mg  weekly, amaryl  2mg  daily. Tolerating GLP1RA without nausea, constipation. Did have some diarrhea. Metformin  intolerance. Admits to dietary indiscretions - sodas, overeating, feels GLP1 RA has lost effect. H/o microalbuminuria, but avoiding SGLT2i in h/o boils to groin. Denies low sugars or hypoglycemic symptoms. Denies paresthesias, blurry vision. Last diabetic eye exam: DUE - encouraged he schedule appt. Glucometer brand: unsure. Last foot exam: 12/2022. DSME: has not completed, interested - referral placed. Notes nocturia, polyuria, polyphagia.  Lab Results  Component Value Date   HGBA1C 13.2 (A) 07/12/2023   Diabetic Foot Exam - Simple   No data filed    Lab Results  Component Value Date   MICROALBUR 10.6 (H) 01/04/2023         Relevant past medical, surgical, family and social history reviewed and updated as indicated. Interim medical history since our last visit reviewed. Allergies and medications reviewed and  updated. Outpatient Medications Prior to Visit  Medication Sig Dispense Refill   amLODipine  (NORVASC ) 5 MG tablet Take 1 tablet (5 mg total) by mouth daily. 90 tablet 4   atorvastatin  (LIPITOR) 20 MG tablet Take 1 tablet (20 mg total) by mouth daily. 90 tablet 4   Blood Glucose Monitoring Suppl DEVI 1 each by Does not apply route in the morning, at noon, and at bedtime. May substitute to any manufacturer covered by patient's insurance. 1 each 0   Dulaglutide  (TRULICITY ) 4.5 MG/0.5ML SOAJ Inject 4.5 mg as directed once a week. 2 mL 11   furosemide  (LASIX ) 40 MG tablet Take 1 tablet (40 mg total) by mouth daily as needed (for swelling or weight gain of 2 lbs overnight or 5 lbs in a week). 30 tablet 3   metoprolol  succinate (TOPROL -XL) 100 MG 24 hr tablet Take 1 tablet (100 mg total) by mouth daily. PLEASE CALL [phone] TO SCHEDULE AN OVERDUE VISIT PRIOR TO NEXT REFILL REQUEST. THANK YOU (1ST ATTEMPT) 30 tablet 0   sacubitril -valsartan  (ENTRESTO ) 97-103 MG Take 1 tablet by mouth twice daily 60 tablet 1   sildenafil  (VIAGRA ) 100 MG tablet TAKE 1 TABLET BY MOUTH ONCE DAILY AS NEEDED FOR ERECTILE DYSFUNCTION 6 tablet 1   glimepiride  (AMARYL ) 2 MG tablet Take 1 tablet (2 mg total) by mouth daily with breakfast. 90 tablet 4   pantoprazole  (PROTONIX ) 20 MG tablet Take 1 tablet (20 mg total)  by mouth daily. 30 tablet 5   benzonatate  (TESSALON ) 200 MG capsule Take 1 capsule (200 mg total) by mouth 3 (three) times daily as needed for cough. 60 capsule 0   oseltamivir  (TAMIFLU ) 75 MG capsule Take 1 capsule (75 mg total) by mouth 2 (two) times daily. 10 capsule 0   No facility-administered medications prior to visit.     Per HPI unless specifically indicated in ROS section below Review of Systems  Objective:  BP (!) 154/100   Pulse 86   Temp 98.1 F (36.7 C) (Oral)   Ht 5\' 9"  (1.753 m)   Wt (!) 359 lb (162.8 kg)   SpO2 96%   BMI 53.02 kg/m   Wt Readings from Last 3 Encounters:  07/12/23 (!)  359 lb (162.8 kg)  01/11/23 (!) 357 lb 4 oz (162 kg)  09/19/22 (!) 355 lb (161 kg)      Physical Exam Vitals and nursing note reviewed.  Constitutional:      Appearance: Normal appearance. He is not ill-appearing.  HENT:     Mouth/Throat:     Mouth: Mucous membranes are dry.     Pharynx: Oropharynx is clear.  Eyes:     Extraocular Movements: Extraocular movements intact.     Conjunctiva/sclera: Conjunctivae normal.     Pupils: Pupils are equal, round, and reactive to light.  Cardiovascular:     Rate and Rhythm: Normal rate and regular rhythm.     Pulses: Normal pulses.     Heart sounds: Normal heart sounds. No murmur heard. Pulmonary:     Effort: Pulmonary effort is normal. No respiratory distress.     Breath sounds: Normal breath sounds. No wheezing, rhonchi or rales.  Musculoskeletal:     Right lower leg: No edema.     Left lower leg: No edema.     Comments: See HPI for foot exam if done  Skin:    General: Skin is warm and dry.     Findings: No rash.  Neurological:     Mental Status: He is alert.  Psychiatric:        Mood and Affect: Mood normal.        Behavior: Behavior normal.       Results for orders placed or performed in visit on 07/12/23  POCT glycosylated hemoglobin (Hb A1C)   Collection Time: 07/12/23  8:16 AM  Result Value Ref Range   Hemoglobin A1C 13.2 (A) 4.0 - 5.6 %   HbA1c POC (<> result, manual entry)     HbA1c, POC (prediabetic range)     HbA1c, POC (controlled diabetic range)      Assessment & Plan:   Problem List Items Addressed This Visit     Type 2 diabetes mellitus with other specified complication (HCC) - Primary   Chronic poor control, deteriorated despite full dose Trulicity . Metformin  intolerance.  Endorses dietary indiscretions. Nutrition and diabetes handout provided. Refer to diabetes education /nutrition. Stop glimepiride . Start daily insulin  Toujeo  300mg /mL 15u daily, slowly titrate by 3u every 3 days if fasting sugar >150.  Recommend he return with pharmacy to facilitate insulin  titration. Start CGM [employer] 3 plus. Sample provided today.  Refer to endocrinology.       Relevant Medications   insulin  glargine, 1 Unit Dial, (TOUJEO  SOLOSTAR) 300 UNIT/ML Solostar Pen   Other Relevant Orders   POCT glycosylated hemoglobin (Hb A1C) (Completed)   Ambulatory referral to diabetic education   Ambulatory referral to Endocrinology   Obesity, morbid, BMI  50 or higher (HCC)   Continue Trulicity  4.5mg  weekly for now. Discussed possible transition to Mounjaro.       Relevant Medications   insulin  glargine, 1 Unit Dial, (TOUJEO  SOLOSTAR) 300 UNIT/ML Solostar Pen   Hypertension associated with type 2 diabetes mellitus (HCC)   Chronic, deteriorated. He recently ran out of toprol  XL. No med changes done today.       Relevant Medications   insulin  glargine, 1 Unit Dial, (TOUJEO  SOLOSTAR) 300 UNIT/ML Solostar Pen   WPW (Wolff-Parkinson-White syndrome)   Relevant Orders   Ambulatory referral to Cardiology   Chronic HFrEF (heart failure with reduced ejection fraction) (HCC)   Refer back to cardiology Uses lasix  PRN Continues daily entresto . Ran out of Toprol  XL - he will restart this      Relevant Orders   Ambulatory referral to Cardiology   Hyperlipidemia associated with type 2 diabetes mellitus (HCC)   Continue atorvastatin  20mg  daily.       Relevant Medications   insulin  glargine, 1 Unit Dial, (TOUJEO  SOLOSTAR) 300 UNIT/ML Solostar Pen   Type 2 diabetes mellitus with microalbuminuria, with long-term current use of insulin  (HCC)   Avoiding SGLT2i in h/o recurrent boils.  Start insulin  as per below.       Relevant Medications   insulin  glargine, 1 Unit Dial, (TOUJEO  SOLOSTAR) 300 UNIT/ML Solostar Pen     Meds ordered this encounter  Medications   pantoprazole  (PROTONIX ) 20 MG tablet    Sig: Take 1 tablet (20 mg total) by mouth daily as needed for heartburn.    Dispense:  30 tablet    Refill:  5    insulin  glargine, 1 Unit Dial, (TOUJEO  SOLOSTAR) 300 UNIT/ML Solostar Pen    Sig: Inject 15 Units into the skin daily.    Dispense:  4.5 mL    Refill:  1   Continuous Glucose Sensor (FREESTYLE LIBRE 3 PLUS SENSOR) MISC    Sig: E11.69 Change sensor every 15 days.    Dispense:  2 each    Refill:  11    Orders Placed This Encounter  Procedures   Ambulatory referral to diabetic education    Referral Priority:   Routine    Referral Type:   Consultation    Referral Reason:   Specialty Services Required    Number of Visits Requested:   1   Ambulatory referral to Cardiology    Referral Priority:   Routine    Referral Type:   Consultation    Referral Reason:   Specialty Services Required    Number of Visits Requested:   1   Ambulatory referral to Endocrinology    Referral Priority:   Routine    Referral Type:   Consultation    Referral Reason:   Specialty Services Required    Number of Visits Requested:   1   POCT glycosylated hemoglobin (Hb A1C)    Patient Instructions  Schedule eye exam We will refer you to diabetes classes, as well as back to cardiology (Dr End).  A1c was up to 13% - will refer you to endocrinology in [place].  Continue trulicity  4.5mg  weekly, may stop amaryl .  Start Toujeo  15 units daily. Increase by 3 units every 3 days if average fasting sugar is >150.  I will also send in a prescription for continuous glucose monitor to use every 2 weeks.  Freestyle libre 3 plus sample provided today to start tracking sugars more regularly.  Good to see you today Return as needed  or in 3 months for physical  Return in 4-6 weeks for diabetes follow up Schedule pharmacist appt for new insulin  start  Follow up plan: Return in about 3 months (around 10/11/2023) for annual exam, prior fasting for blood work.  Claire Crick, MD

## 2023-07-12 NOTE — Telephone Encounter (Signed)
 New referral placed.   Thank you!

## 2023-07-12 NOTE — Assessment & Plan Note (Signed)
 Continue Trulicity  4.5mg  weekly for now. Discussed possible transition to Mounjaro.

## 2023-07-12 NOTE — Patient Instructions (Addendum)
 Schedule eye exam We will refer you to diabetes classes, as well as back to cardiology (Dr End).  A1c was up to 13% - will refer you to endocrinology in [place].  Continue trulicity  4.5mg  weekly, may stop amaryl .  Start Toujeo  15 units daily. Increase by 3 units every 3 days if average fasting sugar is >150.  I will also send in a prescription for continuous glucose monitor to use every 2 weeks.  Freestyle libre 3 plus sample provided today to start tracking sugars more regularly.  Good to see you today Return as needed or in 3 months for physical  Return in 4-6 weeks for diabetes follow up Schedule pharmacist appt for new insulin  start

## 2023-07-12 NOTE — Telephone Encounter (Signed)
 Dr. Crissie Dome requested that pt schedule an appt with pharmacist for new start of insulin . Pt requested a mychart be sent with appt time/date. Pt requested morning time.

## 2023-07-12 NOTE — Assessment & Plan Note (Signed)
 Chronic, deteriorated. He recently ran out of toprol  XL. No med changes done today.

## 2023-07-12 NOTE — Addendum Note (Signed)
 Addended by: Claire Crick on: 07/12/2023 01:22 PM   Modules accepted: Orders

## 2023-07-12 NOTE — Assessment & Plan Note (Signed)
 Continue atorvastatin 20mg  daily

## 2023-07-13 ENCOUNTER — Telehealth: Payer: Self-pay | Admitting: *Deleted

## 2023-07-13 NOTE — Progress Notes (Signed)
 Care Guide Pharmacy Note  07/13/2023 Name: Alvin Phillips MRN: [id number] DOB: 28-Feb-1981  Referred By: Claire Crick, MD Reason for referral: Complex Care Management and Call Attempt #1 (Outreach to schedule referral with pharmacist )   Alvin Phillips is a 43 y.o. year old male who is a primary care patient of Claire Crick, MD.  Alvin Phillips was referred to the pharmacist for assistance related to: DMII  An unsuccessful telephone outreach was attempted today to contact the patient who was referred to the pharmacy team for assistance with medication management. Additional attempts will be made to contact the patient.  Kandis Ormond, CMA [place]  [facility], [facility] Guide Direct Dial: [phone]  Fax: [phone] Website: [email or web address]

## 2023-07-14 NOTE — Progress Notes (Signed)
 Care Guide Pharmacy Note  07/14/2023 Name: Alvin Phillips MRN: [id number] DOB: 03/21/1981  Referred By: Claire Crick, MD Reason for referral: Complex Care Management and Call Attempt #1 (Outreach to schedule referral with pharmacist )   Alvin Phillips is a 43 y.o. year old male who is a primary care patient of Claire Crick, MD.  Alvin Phillips was referred to the pharmacist for assistance related to: DMII  A second unsuccessful telephone outreach was attempted today to contact the patient who was referred to the pharmacy team for assistance with medication management. Additional attempts will be made to contact the patient.  Kandis Ormond, CMA   [facility], [facility] Guide Direct Dial: [phone]  Fax: [phone] Website: [email or web address]

## 2023-07-16 NOTE — Progress Notes (Unsigned)
 Cardiology Clinic Note   Date: 07/18/2023 ID: Alvin Phillips, DOB 1980/11/26, MRN [id number]  [facility] Providers Cardiologist:  Sammy Crisp, MD Electrophysiologist:  Manya Sells, MD {  Chief Complaint   Alvin Phillips is a 43 y.o. male who presents to the clinic today for routine follow up.   Patient Profile   Alvin Phillips is followed by Dr. Nolan Battle and Dr. Carolynne Citron for the history outlined below.      Past medical history significant for: Chronic HFrEF/nonischemic cardiomyopathy. R/LHC 12/18/2017: Normal coronary arteries.  Severely reduced LV systolic function by echo.  LV angiography not performed.  RHC showed severely elevated filling pressures with pulmonary capillary wedge pressure 33 mmHg, moderate to severe pulmonary hypertension and moderately reduced cardiac output 4.8 with cardiac index 1.79. Echo 01/14/2022: EF 35 to 40%.  Global hypokinesis.  Moderately dilated internal LV cavity.  Grade II DD.  Normal RV size/function.  Moderate LAE.  Mild MR. Wolff-Parkinson-White syndrome. Catheter ablation 12/22/2017. Hypertension. Hyperlipidemia. Lipid panel 01/04/2023: LDL 62, HDL 28, TG 312, total 152. OSA. GERD. T2DM.  In summary, patient has a history of Wolff-Parkinson-White syndrome with a past history of noncompliance with hypertensive therapy and cardiology follow-up.  He was admitted to [facility] in late September 2019 with complaints of persistent tachycardia and dyspnea.  He was found to be in SVT at a rate of 190.  He converted spontaneously and was noted to have delta wave consistent with Wolff-Parkinson-White syndrome.  CTA of the chest was negative for PE but did show interstitial edema.  Echo showed EF 20 to 25% with diffuse hypokinesis.  R/LHC demonstrated normal coronary arteries and severely elevated filling pressures as detailed above.  He was transferred to [facility] and underwent catheter ablation which was initially successful but then he was  noted to have recurrent delta waves and was placed on sotalol .  He last saw Dr. Carolynne Citron in January 2022 and noted nonadherence to medications.  He was restarted on metoprolol .  Repeat echo October 2023 showed EF 35 to 40% as detailed above.  Patient was last seen in the office by Dr. Nolan Battle on 05/05/2022 for routine follow-up.  He reported being off medications a few weeks prior secondary to insurance issues but had restarted his regimen.  He had been prescribed dapagliflozin  by PCP but had not started it at the time of his visit.  He had previously been on empagliflozin  but was intolerant secondary to balanitis.  He denied palpitations at that time.  He was instructed to start low-dose dapagliflozin  with goal to increase to 10 mg if tolerated.  He was referred to sleep medicine for discussion of initiation of CPAP versus repeat sleep study.     History of Present Illness    Today, patient reports he is doing well. He is working at a Programme researcher, broadcasting/film/video as a Medical illustrator and does a fair amount of walking with good tolerance. He recently needed 2 doses of Lasix  secondary to feeling fatigued and slightly more dyspneic. He does not weigh at home, as he does not have a scale that can weigh high enough for his current weight. He reports occasional lower extremity edema particularly if he is on his feet a lot. He is working on improving his diet as his A1c was very elevated at his last check (13.2). He admits to a diet high in fried and salty foods as well as sweets. He got married in February and he and his wife are expecting  a child in a couple of weeks. They have been eating out a lot, as his wife has not felt like cooking recently. He admits that he has only been taking Entresto  once a day secondary to forgetting evening dose. He denies chest pain, pressure or tightness.     ROS: All other systems reviewed and are otherwise negative except as noted in History of Present Illness.  EKGs/Labs Reviewed    EKG  Interpretation Date/Time:  Tuesday July 18 2023 14:09:32 EDT Ventricular Rate:  84 PR Interval:  206 QRS Duration:  96 QT Interval:  360 QTC Calculation: 425 R Axis:   -16  Text Interpretation: Normal sinus rhythm Minimal voltage criteria for LVH, may be normal variant ( R in aVL ) T wave abnormality, consider inferolateral ischemia When compared with ECG of 28-Sep-2020 15:28, No significant change was found Confirmed by Morey Ar [id number]) on 07/18/2023 2:19:22 PM   01/04/2023: ALT 19; AST 14; BUN 13; Creatinine, Ser 0.83; Potassium 4.4; Sodium 138   01/04/2023: Hemoglobin 13.4; WBC 7.1   01/04/2023: TSH 0.80    Physical Exam    VS:  BP 120/80   Pulse 90   Ht 5\' 10"  (1.778 m)   Wt (!) 357 lb 6.4 oz (162.1 kg)   SpO2 98%   BMI 51.28 kg/m  , BMI Body mass index is 51.28 kg/m.  GEN: Well nourished, well developed, in no acute distress. Neck: No JVD or carotid bruits. Cardiac:  RRR. No murmurs. No rubs or gallops.   Respiratory:  Respirations regular and unlabored. Clear to auscultation without rales, wheezing or rhonchi. GI: Soft, nontender, nondistended. Extremities: Radials/DP/PT 2+ and equal bilaterally. No clubbing or cyanosis. No edema.  Skin: Warm and dry, no rash. Neuro: Strength intact.  Assessment & Plan   Chronic HFrEF/nonischemic cardiomyopathy [facility] September 2019 showed normal coronary arteries, severely elevated filling pressures with moderate to severe pulmonary hypertension.  Echo October 2023 showed EF 35 to 40%, normal RV size/function, Grade II DD, moderate LAE, mild MR.  Patient reports occasional lower extremity edema particularly when he is on his feet a lot. He took Lasix  x 2 doses recently secondary to feeling fatigued and dyspneic. He does not weigh at home secondary to not having a scale that can accommodate his current weight. He is willing to get a different scale. He admits to dietary indiscretion with sodium eating out a lot and eating fried  and salty foods. No lower extremity edema today. Volume is difficult to assess secondary to body habitus. Overall he appears euvolemic and well compensated on exam. He reported only taking Entresto  once a day, as he often forgets his evening dose. Discussed the importance of his medications in relation to heart failure. Discussed strategies to remember second dose of Entresto .  - Stressed importance of Entresto  twice a day. If he is not taking it twice a day consistently at follow up will consider changing to an ARB.  - Continue Toprol , Lasix  as needed. - Update echo.  - CBC, BMP.  Wolff-Parkinson-White syndrome Ablation October 2019.  Previously on sotalol .  Patient denies palpitations. EKG with no acute changes.  - Continue Toprol .  Hypertension BP today 120/80. No report of headaches or dizziness.  - Continue amlodipine , Toprol , Entresto .  Hyperlipidemia LDL 62 October 2024, at goal. - Continue atorvastatin .  Disposition: CBC and BMP today. Echo. Return in 3 months or sooner as needed.          Signed, Lonell Rives. Daffney Greenly, DNP, NP-C

## 2023-07-17 NOTE — Progress Notes (Signed)
 Care Guide Pharmacy Note  07/17/2023 Name: Alvin Phillips MRN: [id number] DOB: 1980/10/01  Referred By: Claire Crick, MD Reason for referral: Complex Care Management and Call Attempt #1 (Outreach to schedule referral with pharmacist )   Alvin Phillips is a 43 y.o. year old male who is a primary care patient of Claire Crick, MD.  Alvin Phillips was referred to the pharmacist for assistance related to: DMII  A third unsuccessful telephone outreach was attempted today to contact the patient who was referred to the pharmacy team for assistance with medication management. The Population Health team is pleased to engage with this patient at any time in the future upon receipt of referral and should he/she be interested in assistance from the Population Health team.  Kandis Ormond, CMA, [facility]  [facility], [facility] Guide Direct Dial: [phone]  Fax: [phone] Website: [email or web address]

## 2023-07-18 ENCOUNTER — Ambulatory Visit: Attending: Student | Admitting: Student

## 2023-07-18 ENCOUNTER — Encounter: Payer: Self-pay | Admitting: Student

## 2023-07-18 VITALS — BP 120/80 | HR 90 | Ht 70.0 in | Wt 357.4 lb

## 2023-07-18 DIAGNOSIS — I456 Pre-excitation syndrome: Secondary | ICD-10-CM | POA: Diagnosis not present

## 2023-07-18 DIAGNOSIS — I5022 Chronic systolic (congestive) heart failure: Secondary | ICD-10-CM | POA: Diagnosis not present

## 2023-07-18 DIAGNOSIS — E785 Hyperlipidemia, unspecified: Secondary | ICD-10-CM

## 2023-07-18 DIAGNOSIS — I428 Other cardiomyopathies: Secondary | ICD-10-CM | POA: Diagnosis not present

## 2023-07-18 DIAGNOSIS — E1169 Type 2 diabetes mellitus with other specified complication: Secondary | ICD-10-CM

## 2023-07-18 DIAGNOSIS — R0609 Other forms of dyspnea: Secondary | ICD-10-CM | POA: Diagnosis not present

## 2023-07-18 NOTE — Patient Instructions (Signed)
 Medication Instructions:  No changes at this time.   *If you need a refill on your cardiac medications before your next appointment, please call your pharmacy*  Lab Work: CBC & BMET today here in the office.    If you have labs (blood work) drawn today and your tests are completely normal, you will receive your results only by: MyChart Message (if you have MyChart) OR A paper copy in the mail If you have any lab test that is abnormal or we need to change your treatment, we will call you to review the results.  Testing/Procedures: Your physician has requested that you have an echocardiogram. Echocardiography is a painless test that uses sound waves to create images of your heart. It provides your doctor with information about the size and shape of your heart and how well your heart's chambers and valves are working. This procedure takes approximately one hour. There are no restrictions for this procedure. Please do NOT wear cologne, perfume, aftershave, or lotions (deodorant is allowed). Please arrive 15 minutes prior to your appointment time.  Please note: We ask at that you not bring children with you during ultrasound (echo/ vascular) testing. Due to room size and safety concerns, children are not allowed in the ultrasound rooms during exams. Our front office staff cannot provide observation of children in our lobby area while testing is being conducted. An adult accompanying a patient to their appointment will only be allowed in the ultrasound room at the discretion of the ultrasound technician under special circumstances. We apologize for any inconvenience.   Follow-Up: At [facility], you and your health needs are our priority.  As part of our continuing mission to provide you with exceptional heart care, our providers are all part of one team.  This team includes your primary Cardiologist (physician) and Advanced Practice Providers or APPs (Physician Assistants and Nurse  Practitioners) who all work together to provide you with the care you need, when you need it.  Your next appointment:   3 month(s)  Provider:   Sammy Crisp, MD or Morey Ar, NP

## 2023-07-19 LAB — CBC
Hematocrit: 42.3 % (ref 37.5–51.0)
Hemoglobin: 13.6 g/dL (ref 13.0–17.7)
MCH: 29.2 pg (ref 26.6–33.0)
MCHC: 32.2 g/dL (ref 31.5–35.7)
MCV: 91 fL (ref 79–97)
Platelets: 367 10*3/uL (ref 150–450)
RBC: 4.65 x10E6/uL (ref 4.14–5.80)
RDW: 11.9 % (ref 11.6–15.4)
WBC: 7.1 10*3/uL (ref 3.4–10.8)

## 2023-07-19 LAB — BASIC METABOLIC PANEL WITH GFR
BUN/Creatinine Ratio: 20 (ref 9–20)
BUN: 19 mg/dL (ref 6–24)
CO2: 23 mmol/L (ref 20–29)
Calcium: 9.9 mg/dL (ref 8.7–10.2)
Chloride: 98 mmol/L (ref 96–106)
Creatinine, Ser: 0.97 mg/dL (ref 0.76–1.27)
Glucose: 326 mg/dL — ABNORMAL HIGH (ref 70–99)
Potassium: 4.8 mmol/L (ref 3.5–5.2)
Sodium: 138 mmol/L (ref 134–144)
eGFR: 99 mL/min/{1.73_m2} (ref >59)

## 2023-07-24 ENCOUNTER — Encounter: Payer: Self-pay | Admitting: Emergency Medicine

## 2023-07-24 ENCOUNTER — Telehealth: Payer: Self-pay

## 2023-07-24 ENCOUNTER — Other Ambulatory Visit (HOSPITAL_COMMUNITY): Payer: Self-pay

## 2023-07-24 NOTE — Telephone Encounter (Signed)
 Pharmacy Patient Advocate Encounter   Received notification from Patient Pharmacy that prior authorization for Ozempic  2 is required/requested.   Insurance verification completed.   The patient is insured through [facility] MEDICAID .   Per test claim: Refill too soon. PA is not needed at this time. Medication was filled 07/18/23. Next eligible fill date is 08/14/23.

## 2023-08-01 ENCOUNTER — Encounter: Payer: Self-pay | Admitting: Pharmacist

## 2023-08-01 ENCOUNTER — Other Ambulatory Visit

## 2023-08-01 NOTE — Progress Notes (Deleted)
 08/01/2023 Name: Alvin Phillips MRN: [id number] DOB: [birth year]  Subjective  No chief complaint on file.   Reason for visit: ?  Alvin Phillips is a 43 y.o. male with a history of diabetes (type 2), who presents today for an initial diabetes pharmacotherapy visit.? Pertinent PMH also includes HTN, CHF, WPW, OSA, HLD, Obesity, ED.   Known DM Complications: ED, ***  Care Team: Primary Care Provider: Claire Crick, MD   Date of Last Diabetes Related Visit: {LZ with PCP with pharmacist:31532}  Recent Summary of Change: 4/23: Stop glimepiride , start Toujeo  15 units/d  Medication Access/Adherence: Prescription drug coverage: Payor: Advertising copywriter / Plan: [employer] OTHER / Product Type: *No Product type* / .  - Reports that all medications are *** affordable.  - Current Patient Assistance: None*** - Medication Adherence: Patient denies*** missing doses of their medication.    Since Last visit / History of Present Illness: ?  Patient reports implementing*** plan from last visit. Denies adverse effects with titration of ***.   Reported DM Regimen: ?  Trulicity  4.5 mg weekly Toujeo  U300 15 units daily (not taking***)    DM medications tried in the past:?  Metformin  XR (nausea)   SMBG {Blank single:19197::"Per BG meter","Per patient provided BG log","Per patient memory","***"}: ?  ***   Hypo/Hyperglycemia: ?  Symptoms of hypoglycemia since last visit:? {Blank multiple:19197::"yes","no","n/a","***"}  If yes, it was treated by: {Blank multiple:19197::"***","n/a"}  Symptoms of hyperglycemia since last visit:? {Blank multiple:19197::"yes","no","n/a","***"} - {symptoms; hyperglycemia:17903}  Reported Diet: Patient typically eats *** meals per day.  Breakfast: *** Lunch: *** Dinner: *** Snacks: *** Beverages: ***  Exercise: ***  DM Prevention:  Statin: {Blank single:19197::"***","Taking","Not taking","Intolerant to","Declines"}; {Blank single:19197::"low  intensity","moderate intensity","high intensity","n/a"}.?  History of chronic kidney disease? {Blank single:19197::"yes","no"} History of albuminuria? {Blank single:19197::"yes","no"}, last UACR on *** = *** mg/g Lab Results  Component Value Date   MICRALBCREAT 11.5 01/04/2023   MICRALBCREAT 17.0 06/30/2021   MICRALBCREAT 56.8 (H) 02/10/2020   MICRALBCREAT 11.0 02/24/2016   MICRALBCREAT 2.4 07/29/2014   ACE/ARB - {Blank single:19197::"***","Taking","Not taking","Intolerant to","Declines"} ***; Urine MA/CR Ratio - {Blank single:19197::"normal","elevated urinary albumin excretion","n/a","***"}.  Last eye exam: ***; {lzdmeyeexam:31121} Lab Results  Component Value Date   HMDIABEYEEXA No Retinopathy 09/29/2021   Last foot exam: 01/11/2023 Tobacco Use: ***  Tobacco Use: Medium Risk (07/18/2023)   Patient History    Smoking Tobacco Use: Former    Smokeless Tobacco Use: Never    Passive Exposure: Not on file   Immunizations:? Flu: {LZDUEUPTODATE:31033} (Last: ); Pneumococcal: {LZVAXpneumococcal:31032:::0} - {LZVAXpneumo RISKAge19-64:31093}; Shingrix: {LZDUEUPTODATE:31033} (Last: , ); Covid (***)  Cardiovascular Risk Reduction History of clinical ASCVD? {Blank single:19197::"yes","no"} The 10-year ASCVD risk score (Arnett DK, et al., 2019) is: 11.6% History of heart failure? {Blank single:19197::"yes","no"} {HF MEDS CURRENT:31255} History of hyperlipidemia? {Blank single:19197::"yes","no"} Current BMI: *** kg/m2 (Ht *** in, Wt *** kg) Taking statin? {Blank single:19197::"yes","no","intolerant","declines"}; {Blank single:19197::"low intensity","moderate intensity","high intensity","n/a"} (***) Taking aspirin ? {Blank single:19197::"indicated (primary prevention)","indicated (secondary prevention)","not indicated","unclear if indicated"}; {Blank single:19197::"***","Taking","Not taking","Intolerant to","Declines"}   Taking SGLT-2i? {Blank single:19197::"yes","no"} Taking GLP- 1 RA? {Blank  single:19197::"yes","no"}   Reported HTN Regimen: ?  *** mg {LZ frequency:31544} *** mg {LZ frequency:31544} *** mg {LZ frequency:31544} *** mg {LZ frequency:31544} *** mg {LZ frequency:31544}   HTN medications tried in the past:?  *** (***)  Patient takes their blood pressure medications {Time; morning/afternoon/evening:19180} Patient {Is/is not:9024} checking their blood pressure at home regularly.  Current blood pressure readings readings: {lzhomemonitoring:31119} Usually range ***/*** mmHg (morning)  Patient {Actions; denies-reports:120008}  hypotensive s/sx. No dizziness, lightheadedness.  Patient {Actions; denies-reports:120008} hypertensive symptoms. No headache, chest pain, shortness of breath, visual changes.      _______________________________________________  Objective    Review of Systems:? Limited in the setting of virtual visit *** Constitutional:? No fever, chills or unintentional weight loss  Cardiovascular:? No chest pain or pressure, shortness of breath, dyspnea on exertion, orthopnea or LE edema  Pulmonary:? No cough or shortness of breath  GI:? No nausea, vomiting, constipation, diarrhea, abdominal pain, dyspepsia, change in bowel habits  Endocrine:? No polyuria, polyphagia or blurred vision  Psych:? No depression, anxiety, insomnia    Physical Examination:  Vitals:  Wt Readings from Last 3 Encounters:  07/18/23 (!) 357 lb 6.4 oz (162.1 kg)  07/12/23 (!) 359 lb (162.8 kg)  01/11/23 (!) 357 lb 4 oz (162 kg)   BP Readings from Last 3 Encounters:  07/18/23 120/80  07/12/23 (!) 154/100  01/11/23 138/78   Pulse Readings from Last 3 Encounters:  07/18/23 90  07/12/23 86  01/11/23 80     Labs:?  Lab Results  Component Value Date   HGBA1C 13.2 (A) 07/12/2023   HGBA1C 11.3 (H) 01/04/2023   HGBA1C 10.3 (A) 04/25/2022   GLUCOSE 326 (H) 07/18/2023   MICRALBCREAT 11.5 01/04/2023   MICRALBCREAT 17.0 06/30/2021   MICRALBCREAT 56.8 (H) 02/10/2020    CREATININE 0.97 07/18/2023   CREATININE 0.83 01/04/2023   CREATININE 0.84 04/25/2022   GFR 107.06 01/04/2023   GFR 107.19 04/25/2022   GFR 106.31 06/30/2021    Lab Results  Component Value Date   CHOL 152 01/04/2023   LDLCALC 62 01/04/2023   LDLCALC 93 12/17/2017   LDLCALC 86 09/04/2013   LDLDIRECT 84.0 09/16/2021   LDLDIRECT 77.0 06/30/2021   HDL 28.20 (L) 01/04/2023   TRIG 312.0 (H) 01/04/2023   TRIG (H) 06/30/2021    916.0 Triglyceride is over 400; calculations on Lipids are invalid.   TRIG 215 (H) 12/17/2017   ALT 19 01/04/2023   ALT 21 06/30/2021   AST 14 01/04/2023   AST 15 06/30/2021      Chemistry      Component Value Date/Time   NA 138 07/18/2023 1457   NA 140 12/10/2013 1504   K 4.8 07/18/2023 1457   K 3.6 12/10/2013 1504   CL 98 07/18/2023 1457   CL 105 12/10/2013 1504   CO2 23 07/18/2023 1457   CO2 31 12/10/2013 1504   BUN 19 07/18/2023 1457   BUN 11 12/10/2013 1504   CREATININE 0.97 07/18/2023 1457   CREATININE 0.97 12/10/2013 1504      Component Value Date/Time   CALCIUM  9.9 07/18/2023 1457   CALCIUM  8.6 12/10/2013 1504   ALKPHOS 78 01/04/2023 0836   ALKPHOS 97 07/09/2013 1912   AST 14 01/04/2023 0836   AST 17 07/09/2013 1912   ALT 19 01/04/2023 0836   ALT 35 07/09/2013 1912   BILITOT 0.5 01/04/2023 0836   BILITOT 0.3 07/09/2013 1912       The 10-year ASCVD risk score (Arnett DK, et al., 2019) is: 11.6%  Assessment and Plan:   1. Diabetes, {Blank single:19197::"type 1","type 2","LADA","MODY","post-transplant"}: {CHL Controlled/Uncontrolled:[id number]} per last A1c of ***% (***), {Increased/decreased/na:15831} from previous ***% (***) with goal <7% without hypoglycemia.  Current Regimen: *** Diet: *** Exercise: ***  HCM: {LZ HCM DM2:31604} Continue medications today without changes.  ***  Reviewed s/sx/tx hypoglycemia Next A1c due *** Future Consideration: GLP1-RA: consider transition to more effective agent (Ozempic  vs Mounjaro).  Mounjaro test  claim = $35/month  SGLT2i: *** Metformin : Previous 'nausea'   SU: Not unreasonable, though defer given alternative options with lower risk of hypoglycemia.  TZD: Avoiding due to possible weight gain/increase in fracture risk. ***HF Insulin : Not unreasonable, though defer as last resort or as needed for severe hyperglycemia given risk hypoglycemia. Other safer options at this time.     2. HTN: {CHL Controlled/Uncontrolled:[phone]} based on last clinic BP of *** mmHg (***), goal <130/80 mmHg. Does not *** monitor BP at home. Denies lightheadedness, dizziness, SOB, CP, vision changes.  Current Regimen: *** Continue medications without changes.  BP Readings from Last 3 Encounters:  07/18/23 120/80  07/12/23 (!) 154/100  01/11/23 138/78    3. ASCVD (*** prevention): LDL {CHL DESC; PROGRESS TOWARD GOAL:21523} on last lipid panel with LDL *** mg/dL, TG *** mg/dL (***). LDL goal {ldl goal:31310}.  Key risk factors include: {cvs risk:31001} The 10-year ASCVD risk score (Arnett DK, et al., 2019) is: 11.6% indicated patient is at *** risk.  PREVENT: 10-Yr CAD ***%, 30-Yr CAD ***% indicated patient is at *** risk.  (those without ASCVD, CHF), Age 14-79 (includes additional metabolic risk factors, BMI, GFR, UACR, no race) Current Regimen: *** mg daily Continue medications today without changes.  Lab Results  Component Value Date   LDLCALC 62 01/04/2023   LDLCALC 93 12/17/2017   LDLDIRECT 84.0 09/16/2021   LDLDIRECT 77.0 06/30/2021   TRIG 312.0 (H) 01/04/2023   TRIG (H) 06/30/2021    916.0 Triglyceride is over 400; calculations on Lipids are invalid.     4. Healthcare Maintenance:  Pneumococcal - Current status: ***  Shingles - Current status: *** Influenza - Current status: ***  Due to receive the following vaccines: {LZAPIMMUNIZATION:31028}   Follow Up Follow up with clinical pharmacist via *** in *** weeks Patient given direct line for questions regarding medication  therapy and MAP applications***  Future Appointments  Date Time Provider Department Center  08/01/2023  3:15 PM [facility] PHARMACIST [facility]  08/29/2023 10:00 AM Claire Crick, MD [facility]  08/29/2023  2:00 PM [facility] US  2 [facility] None  09/05/2023  9:15 AM El-Khouri, Shila Door, RD [facility]DM  10/31/2023 10:30 AM Morey Ar, NP [facility] None  01/05/2024  8:30 AM [facility] LAB [facility]  01/12/2024 11:30 AM Claire Crick, MD [facility]    Daron Ellen, PharmD Clinical Pharmacist [facility] [phone]

## 2023-08-01 NOTE — Progress Notes (Signed)
 Attempted to contact patient for scheduled appointment for medication management. Left HIPAA compliant message for patient to return my call at their convenience. Unable to leave voicemail due to not set up yet.

## 2023-08-07 ENCOUNTER — Other Ambulatory Visit: Payer: Self-pay | Admitting: Internal Medicine

## 2023-08-29 ENCOUNTER — Encounter: Payer: Self-pay | Admitting: Family Medicine

## 2023-08-29 ENCOUNTER — Ambulatory Visit: Attending: Student

## 2023-08-29 ENCOUNTER — Ambulatory Visit: Admitting: Family Medicine

## 2023-08-29 VITALS — BP 132/86 | HR 83 | Temp 98.4°F | Ht 70.0 in | Wt 350.2 lb

## 2023-08-29 DIAGNOSIS — E1169 Type 2 diabetes mellitus with other specified complication: Secondary | ICD-10-CM | POA: Diagnosis not present

## 2023-08-29 DIAGNOSIS — Z794 Long term (current) use of insulin: Secondary | ICD-10-CM

## 2023-08-29 DIAGNOSIS — R0609 Other forms of dyspnea: Secondary | ICD-10-CM | POA: Diagnosis not present

## 2023-08-29 DIAGNOSIS — I5022 Chronic systolic (congestive) heart failure: Secondary | ICD-10-CM

## 2023-08-29 DIAGNOSIS — Z6841 Body Mass Index (BMI) 40.0 and over, adult: Secondary | ICD-10-CM

## 2023-08-29 DIAGNOSIS — I428 Other cardiomyopathies: Secondary | ICD-10-CM

## 2023-08-29 LAB — BASIC METABOLIC PANEL WITH GFR
BUN: 13 mg/dL (ref 6–23)
CO2: 29 meq/L (ref 19–32)
Calcium: 9.1 mg/dL (ref 8.4–10.5)
Chloride: 102 meq/L (ref 96–112)
Creatinine, Ser: 0.86 mg/dL (ref 0.40–1.50)
GFR: 105.44 mL/min (ref >60.00)
Glucose, Bld: 178 mg/dL — ABNORMAL HIGH (ref 70–99)
Potassium: 4.3 meq/L (ref 3.5–5.1)
Sodium: 139 meq/L (ref 135–145)

## 2023-08-29 NOTE — Addendum Note (Signed)
 Addended by: Claire Crick on: 08/29/2023 10:37 AM   Modules accepted: Orders

## 2023-08-29 NOTE — Assessment & Plan Note (Signed)
Appreciate cardiology care.  °

## 2023-08-29 NOTE — Progress Notes (Addendum)
 Ph: [phone] Fax: [phone]   Patient ID: Alvin Phillips, male    DOB: [birth year], 43 y.o.   MRN: [id number]  This visit was conducted in person.  BP 132/86   Pulse 83   Temp 98.4 F (36.9 C) (Oral)   Ht 5\' 10"  (1.778 m)   Wt (!) 350 lb 4 oz (158.9 kg)   SpO2 97%   BMI 50.26 kg/m    CC: DM f/u visit  Subjective:   HPI: Alvin Phillips is a 43 y.o. male presenting on 08/29/2023 for Medical Management of Chronic Issues (Here for 4-6 wk DM f/u.)   4 wk newborn (Tiana) at home.   DM - does regularly check sugars. Notes significant improvement in sugars over the past week down to 180, mornings running <200, confirmed on weekly summary. He's been following IF - eating 1 meal a day. Saw endocrinology Delfin Fell NP at [facility]. Started on [place] 5mg  weekly (for past 3 weeks), toujeo  28 units daily, glimepiride  2mg  daily. Also started on humalog kwikpen per sliding scale below - he's not been doing mealtime insulin . Planning to join gym. Avoid SGLT2i due to h/o boils to groin. Denies low sugars or hypoglycemic symptoms. Denies paresthesias, blurry vision. Last diabetic eye exam DUE. Glucometer brand: freestyle libre 3 plus. Last foot exam: 12/2022. DSME: upcoming next month. Humalog 3 times per day before meals on sliding scale  100-120: 2 units  121-150: 4 units  151-200: 5 units  201-250: 6 units  251-300: 8 units  301-350: 10 units  over 350: 12 units   CGM data: freestyle libre 3 plus.  30 day average sugar: 326, time in range 2%, hypoglycemia 0%, stage 1 hyperglycemia 16%, stage 2 hyperglycemia 82%.   Over the past week sugars 150-250s  Lab Results  Component Value Date   HGBA1C 13.2 (A) 07/12/2023   Diabetic Foot Exam - Simple   No data filed    Lab Results  Component Value Date   MICROALBUR 10.6 (H) 01/04/2023         Relevant past medical, surgical, family and social history reviewed and updated as indicated. Interim medical history since our  last visit reviewed. Allergies and medications reviewed and updated. Outpatient Medications Prior to Visit  Medication Sig Dispense Refill   amLODipine  (NORVASC ) 5 MG tablet Take 1 tablet (5 mg total) by mouth daily. 90 tablet 4   atorvastatin  (LIPITOR) 20 MG tablet Take 1 tablet (20 mg total) by mouth daily. 90 tablet 4   Blood Glucose Monitoring Suppl DEVI 1 each by Does not apply route in the morning, at noon, and at bedtime. May substitute to any manufacturer covered by patient's insurance. 1 each 0   Continuous Glucose Sensor (FREESTYLE LIBRE 3 PLUS SENSOR) MISC E11.69 Change sensor every 15 days. 2 each 11   furosemide  (LASIX ) 40 MG tablet Take 1 tablet (40 mg total) by mouth daily as needed (for swelling or weight gain of 2 lbs overnight or 5 lbs in a week). 30 tablet 3   glimepiride  (AMARYL ) 2 MG tablet Take 1 tablet (2 mg total) by mouth daily before breakfast.     metoprolol  succinate (TOPROL -XL) 100 MG 24 hr tablet Take 1 tablet (100 mg total) by mouth daily. 30 tablet 10   pantoprazole  (PROTONIX ) 20 MG tablet Take 1 tablet (20 mg total) by mouth daily as needed for heartburn. 30 tablet 5   sacubitril -valsartan  (ENTRESTO ) 97-103 MG Take 1 tablet by mouth twice  daily 60 tablet 1   sildenafil  (VIAGRA ) 100 MG tablet TAKE 1 TABLET BY MOUTH ONCE DAILY AS NEEDED FOR ERECTILE DYSFUNCTION 6 tablet 1   tirzepatide (MOUNJARO) 5 MG/0.5ML Pen Inject 5 mg into the skin once a week.     insulin  glargine, 1 Unit Dial, (TOUJEO  SOLOSTAR) 300 UNIT/ML Solostar Pen Inject 15 Units into the skin daily. 4.5 mL 1   insulin  glargine, 1 Unit Dial, (TOUJEO  SOLOSTAR) 300 UNIT/ML Solostar Pen Inject 28 Units into the skin daily.     Dulaglutide  (TRULICITY ) 4.5 MG/0.5ML SOAJ Inject 4.5 mg as directed once a week. 2 mL 11   No facility-administered medications prior to visit.     Per HPI unless specifically indicated in ROS section below Review of Systems  Objective:  BP 132/86   Pulse 83   Temp 98.4 F  (36.9 C) (Oral)   Ht 5\' 10"  (1.778 m)   Wt (!) 350 lb 4 oz (158.9 kg)   SpO2 97%   BMI 50.26 kg/m   Wt Readings from Last 3 Encounters:  08/29/23 (!) 350 lb 4 oz (158.9 kg)  07/18/23 (!) 357 lb 6.4 oz (162.1 kg)  07/12/23 (!) 359 lb (162.8 kg)      Physical Exam Vitals and nursing note reviewed.  Constitutional:      Appearance: Normal appearance. He is not ill-appearing.  HENT:     Mouth/Throat:     Mouth: Mucous membranes are moist.     Pharynx: Oropharynx is clear. No oropharyngeal exudate or posterior oropharyngeal erythema.  Eyes:     Extraocular Movements: Extraocular movements intact.     Pupils: Pupils are equal, round, and reactive to light.  Cardiovascular:     Rate and Rhythm: Normal rate and regular rhythm.     Pulses: Normal pulses.     Heart sounds: Normal heart sounds. No murmur heard. Pulmonary:     Effort: Pulmonary effort is normal. No respiratory distress.     Breath sounds: Normal breath sounds. No wheezing, rhonchi or rales.  Musculoskeletal:     Right lower leg: No edema.     Left lower leg: No edema.  Neurological:     Mental Status: He is alert.  Psychiatric:        Mood and Affect: Mood normal.        Behavior: Behavior normal.       Results for orders placed or performed in visit on 07/18/23  Basic metabolic panel with GFR   Collection Time: 07/18/23  2:57 PM  Result Value Ref Range   Glucose 326 (H) 70 - 99 mg/dL   BUN 19 6 - 24 mg/dL   Creatinine, Ser 1.61 0.76 - 1.27 mg/dL   eGFR 99 >09 UE/AVW/0.98   BUN/Creatinine Ratio 20 9 - 20   Sodium 138 134 - 144 mmol/L   Potassium 4.8 3.5 - 5.2 mmol/L   Chloride 98 96 - 106 mmol/L   CO2 23 20 - 29 mmol/L   Calcium  9.9 8.7 - 10.2 mg/dL  CBC   Collection Time: 07/18/23  2:57 PM  Result Value Ref Range   WBC 7.1 3.4 - 10.8 x10E3/uL   RBC 4.65 4.14 - 5.80 x10E6/uL   Hemoglobin 13.6 13.0 - 17.7 g/dL   Hematocrit 11.9 14.7 - 51.0 %   MCV 91 79 - 97 fL   MCH 29.2 26.6 - 33.0 pg   MCHC 32.2  31.5 - 35.7 g/dL   RDW 82.9 56.2 - 13.0 %  Platelets 367 150 - 450 x10E3/uL    Assessment & Plan:   Problem List Items Addressed This Visit     Type 2 diabetes mellitus with other specified complication (HCC) - Primary   Chronic. Improved sugar readings as evidenced by CGM data reviewed.  Established with [facility] end[facility]ology - appreciate their care.  He continues mounjaro, toujeo  28u daily, and amaryl . Currently not taking mealtime insulin . Weight loss noted in setting of mounjaro use + IF.  Will draw labs requested by endo - will forward results to endo.  Keep endo f/u.  Recommend he schedule eye exam - planning to go to [facility] in [place] 4 mo for CPE.       Relevant Medications   insulin  glargine, 1 Unit Dial, (TOUJEO  SOLOSTAR) 300 UNIT/ML Solostar Pen   tirzepatide (MOUNJARO) 5 MG/0.5ML Pen   glimepiride  (AMARYL ) 2 MG tablet   Other Relevant Orders   Basic metabolic panel with GFR   C-peptide   Glutamic acid decarboxylase auto abs   Obesity, morbid, BMI 50 or higher (HCC)   Congratulated on 9 lb weight loss since last seen. Now on mounjaro 5mg  weekly.       Relevant Medications   insulin  glargine, 1 Unit Dial, (TOUJEO  SOLOSTAR) 300 UNIT/ML Solostar Pen   tirzepatide (MOUNJARO) 5 MG/0.5ML Pen   glimepiride  (AMARYL ) 2 MG tablet   Chronic HFrEF (heart failure with reduced ejection fraction) (HCC)   Appreciate cardiology care.         No orders of the defined types were placed in this encounter.   Orders Placed This Encounter  Procedures   Basic metabolic panel with GFR   C-peptide   Glutamic acid decarboxylase auto abs    Patient Instructions  Labs today - I'll forward results to endocrinology Congratulations on improving sugar readings this past week - keep it up!  Schedule eye exam with Joette Mustard.  Return after 01/11/2024 for physical  Follow up plan: Return in about 4 months (around 01/11/2024) for annual exam, prior fasting for blood  work.  Claire Crick, MD

## 2023-08-29 NOTE — Assessment & Plan Note (Addendum)
 Chronic. Improved sugar readings as evidenced by CGM data reviewed.  Established with [facility] end[facility] - appreciate their care.  He continues mounjaro, toujeo  28u daily, and amaryl . Currently not taking mealtime insulin . Weight loss noted in setting of mounjaro use + IF.  Will draw labs requested by endo - will forward results to endo.  Keep endo f/u.  Recommend he schedule eye exam - planning to go to [facility] in [place] 4 mo for CPE.

## 2023-08-29 NOTE — Patient Instructions (Addendum)
 Labs today - I'll forward results to endocrinology Congratulations on improving sugar readings this past week - keep it up!  Schedule eye exam with Joette Mustard.  Return after 01/11/2024 for physical

## 2023-08-29 NOTE — Assessment & Plan Note (Signed)
 Congratulated on 9 lb weight loss since last seen. Now on mounjaro 5mg  weekly.

## 2023-08-30 ENCOUNTER — Ambulatory Visit: Payer: Self-pay | Admitting: Family Medicine

## 2023-08-30 LAB — ECHOCARDIOGRAM COMPLETE
AR max vel: 2.95 cm2
AV Area VTI: 3.08 cm2
AV Area mean vel: 2.83 cm2
AV Mean grad: 9 mmHg
AV Peak grad: 16.8 mmHg
Ao pk vel: 2.05 m/s
Area-P 1/2: 3.31 cm2
Height: 70 in
S' Lateral: 5.37 cm
Weight: 5604 [oz_av]

## 2023-08-31 ENCOUNTER — Ambulatory Visit: Payer: Self-pay | Admitting: Student

## 2023-08-31 NOTE — Progress Notes (Signed)
 Attempted to call patient and speak to him regarding the results of the last ECHO and to ensure that he is taking his medications as prescribed.  Patient's phone was not answered and was unable to leave a message due to voicemail box has not been set up.  Attempted to call the patient's mom and dad (same number) and got the same message on that after the phone was not answered.  MyChart message sent to the patient.  Will attempt to call again later on.

## 2023-09-04 LAB — GLUTAMIC ACID DECARBOXYLASE AUTO ABS: Glutamic Acid Decarb Ab: <5 [IU]/mL (ref <5)

## 2023-09-04 LAB — C-PEPTIDE: C-Peptide: 4.2 ng/mL — ABNORMAL HIGH (ref 0.80–3.85)

## 2023-09-05 ENCOUNTER — Encounter: Attending: Family Medicine | Admitting: Dietician

## 2023-09-05 ENCOUNTER — Encounter: Payer: Self-pay | Admitting: Dietician

## 2023-09-05 VITALS — Wt 357.0 lb

## 2023-09-05 DIAGNOSIS — Z794 Long term (current) use of insulin: Secondary | ICD-10-CM | POA: Diagnosis present

## 2023-09-05 DIAGNOSIS — R809 Proteinuria, unspecified: Secondary | ICD-10-CM | POA: Diagnosis present

## 2023-09-05 DIAGNOSIS — E1129 Type 2 diabetes mellitus with other diabetic kidney complication: Secondary | ICD-10-CM | POA: Insufficient documentation

## 2023-09-05 NOTE — Progress Notes (Signed)
 Diabetes Self-Management Education  Visit Type: First/Initial  Appt. Start Time: 0920 Appt. End Time: 1020  09/05/2023  [name], identified by name and date of birth, is a 43 y.o. male with a diagnosis of Diabetes: Type 2.   ASSESSMENT  History includes: CHF, type 2 diabetes, GERD, HTN Labs noted: 07/12/23 A1c 13.2% Medications include: insulin  glargine, mounjaro, meal time insulin   Supplements: none reported  Pt states he was taking trulicity  and switched to mounjaro, and started a long acting and meal time insulin . Pt states when he started meal time insulin  he intentionally started skipping more meals so that he would not have to give himself insulin , thus he usually has a egg white grill from [employer] for breakfast, and then snacks on bananas and peanut butter crackers during the day at work. 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   Pt states he recently made changes to his diet after his last A1c, including drinking mostly water (may have small glass of juice 1-2x/wk), and was previously drinking sodas throughout the day. Pt reports he has been getting grilled chicken salads and chipotle for dinner. Pt reports he was previously eating a lot of burgers and bread.   Pt reports he has been checking his CGM throughout the day. Pt reports right now his blood glucose is 250mg /dL and reports eating a banana this morning.   Pt reports he has a 67 and 44 year old, along with a 29 month old.   Pt states he has been considering getting a gym membership and wants to start going 3 days a week for 30 minutes.   Weight (!) 357 lb (161.9 kg). Body mass index is 51.22 kg/m.   Diabetes Self-Management Education - 09/05/23 0917       Visit Information   Visit Type First/Initial      Initial Visit   Diabetes Type Type 2    Date Diagnosed 8 years ago    Are you currently following a meal plan? No    Are you taking your medications as prescribed? Yes      Health  Coping   How would you rate your overall health? Good      Psychosocial Assessment   Patient Belief/Attitude about Diabetes Motivated to manage diabetes    What is the hardest part about your diabetes right now, causing you the most concern, or is the most worrisome to you about your diabetes?   Making healty food and beverage choices    Self-care barriers None    Self-management support Doctor's office    Other persons present Patient    Patient Concerns Nutrition/Meal planning    Special Needs None    Preferred Learning Style Hands on    Learning Readiness Ready    How often do you need to have someone help you when you read instructions, pamphlets, or other written materials from your doctor or pharmacy? 1 - Never    What is the last grade level you completed in school? 12      Pre-Education Assessment   Patient understands the diabetes disease and treatment process. Needs Instruction    Patient understands incorporating nutritional management into lifestyle. Needs Instruction    Patient undertands incorporating physical activity into lifestyle. Needs Instruction    Patient understands using medications safely. Needs Instruction    Patient understands monitoring blood glucose, interpreting and using results Needs Instruction    Patient understands prevention, detection, and treatment of acute complications.  Needs Instruction    Patient understands prevention, detection, and treatment of chronic complications. Needs Instruction    Patient understands how to develop strategies to address psychosocial issues. Needs Instruction    Patient understands how to develop strategies to promote health/change behavior. Needs Instruction      Complications   Last HgB A1C per patient/outside source 13.2 %    How often do you check your blood sugar? > 4 times/day    Fasting Blood glucose range (mg/dL) 161-096    Postprandial Blood glucose range (mg/dL) >045;409-811;914-782    Have you had a  dilated eye exam in the past 12 months? No    Have you had a dental exam in the past 12 months? No    Are you checking your feet? Yes    How many days per week are you checking your feet? 2      Dietary Intake   Breakfast chickfila egg white grilled chicken sandwich    Snack (morning) banana    Lunch peanut butter crackers    Snack (afternoon) banana and peanut butter crackers    Dinner 8pm- grilled chicken salad and twice baked potato OR chipotle OR zaxbys grilled chicken salad    Snack (evening) dark chocolate almonds    Beverage(s) 100+ oz water, occasional apple juice      Activity / Exercise   Activity / Exercise Type ADL's      Patient Education   Previous Diabetes Education No    Disease Pathophysiology Definition of diabetes, type 1 and 2, and the diagnosis of diabetes;Explored patient's options for treatment of their diabetes;Factors that contribute to the development of diabetes    Healthy Eating Role of diet in the treatment of diabetes and the relationship between the three main macronutrients and blood glucose level;Plate Method;Reviewed blood glucose goals for pre and post meals and how to evaluate the patients' food intake on their blood glucose level.;Meal timing in regards to the patients' current diabetes medication.;Meal options for control of blood glucose level and chronic complications.;Information on hints to eating out and maintain blood glucose control.    Being Active Role of exercise on diabetes management, blood pressure control and cardiac health.;Helped patient identify appropriate exercises in relation to his/her diabetes, diabetes complications and other health issue.    Medications Reviewed patients medication for diabetes, action, purpose, timing of dose and side effects.;Taught/reviewed insulin /injectables, injection, site rotation, insulin /injectables storage and needle disposal.    Monitoring Taught/evaluated CGM (comment)    Acute complications Discussed  and identified patients' prevention, symptoms, and treatment of hyperglycemia.;Taught prevention, symptoms, and  treatment of hypoglycemia - the 15 rule.    Chronic complications Relationship between chronic complications and blood glucose control;Identified and discussed with patient  current chronic complications    Diabetes Stress and Support Identified and addressed patients feelings and concerns about diabetes;Worked with patient to identify barriers to care and solutions;Role of stress on diabetes    Lifestyle and Health Coping Lifestyle issues that need to be addressed for better diabetes care      Individualized Goals (developed by patient)   Nutrition General guidelines for healthy choices and portions discussed    Physical Activity Exercise 3-5 times per week;30 minutes per day    Medications take my medication as prescribed    Monitoring  Test my blood glucose as discussed;Consistenly use CGM    Problem Solving Eating Pattern    Reducing Risk examine blood glucose patterns;do foot checks daily;treat hypoglycemia with 15 grams of  carbs if blood glucose less than 70mg /dL    Health Coping Ask for help with psychological, social, or emotional issues      Post-Education Assessment   Patient understands the diabetes disease and treatment process. Comprehends key points    Patient understands incorporating nutritional management into lifestyle. Comprehends key points    Patient undertands incorporating physical activity into lifestyle. Comprehends key points    Patient understands using medications safely. Comphrehends key points    Patient understands monitoring blood glucose, interpreting and using results Comprehends key points    Patient understands prevention, detection, and treatment of acute complications. Comprehends key points    Patient understands prevention, detection, and treatment of chronic complications. Comprehends key points    Patient understands how to develop strategies  to address psychosocial issues. Comprehends key points    Patient understands how to develop strategies to promote health/change behavior. Comprehends key points      Outcomes   Expected Outcomes Demonstrated interest in learning. Expect positive outcomes    Future DMSE 2 months    Program Status Not Completed          Individualized Plan for Diabetes Self-Management Training:   Learning Objective:  Patient will have a greater understanding of diabetes self-management. Patient education plan is to attend individual and/or group sessions per assessed needs and concerns.   Plan:   Patient Instructions  Goals Established by Patient:  Goal 1: get a gym membership and go 3 days a week for 30 minutes.   Goal 2: eat 3 meals per day (ideally following Plate Method) and use mealtime insulin  15 minutes prior to meal.   Expected Outcomes:  Demonstrated interest in learning. Expect positive outcomes  Education material provided: ADA - How to Thrive: A Guide for Your Journey with Diabetes, My Plate, and Snack sheet  If problems or questions, patient to contact team via:  Phone  Future DSME appointment: 2 months

## 2023-09-05 NOTE — Patient Instructions (Signed)
 Goals Established by Patient:  Goal 1: get a gym membership and go 3 days a week for 30 minutes.   Goal 2: eat 3 meals per day (ideally following Plate Method) and use mealtime insulin  15 minutes prior to meal.

## 2023-09-05 NOTE — Telephone Encounter (Signed)
 Called and spoke with the patient regarding the results of the last ECHO as reviewed by DW.  Patient verbalized understanding of the results.  Patient was asked about taking his prescription medications as prescribed and patient stated I do take them as prescribed, but I do forget to take my Entresto  sometimes. I know its a 12 hour medication but sometimes I just forget to take the 2nd dose. But I will try to remember to take it both times. Patient reassured this RN that the other medications he does take as prescribed.  This RN did inquire about the reason (maybe financial) that the patient may have had for only taking the Entresto  once a day.  Patient reassured this RN that this was not the case.  Patient did not express any questions or concerns at this time.

## 2023-09-10 ENCOUNTER — Other Ambulatory Visit: Payer: Self-pay | Admitting: Internal Medicine

## 2023-09-20 ENCOUNTER — Telehealth: Payer: Self-pay

## 2023-09-20 ENCOUNTER — Other Ambulatory Visit (HOSPITAL_COMMUNITY): Payer: Self-pay

## 2023-09-20 NOTE — Telephone Encounter (Signed)
 Pharmacy Patient Advocate Encounter   Received notification from CoverMyMeds that prior authorization for FreeStyle Libre 3 plus sensor is required/requested.   Insurance verification completed.   The patient is insured through [facility] .   Per test claim: [employer] used e voucher. PA CMM Key:[id number] [place] pending

## 2023-09-21 ENCOUNTER — Other Ambulatory Visit (HOSPITAL_COMMUNITY): Payer: Self-pay

## 2023-09-21 NOTE — Telephone Encounter (Signed)
 Pharmacy Patient Advocate Encounter  Received notification from [place] MEDICAID that Prior Authorization for FreeStyle Libre 3 Plus sensor has been APPROVED from 09/20/23 to 09/19/24. Ran test claim, Copay is $39.99. This test claim was processed through [facility]- copay amounts may vary at other pharmacies due to pharmacy/plan contracts, or as the patient moves through the different stages of their insurance plan.   PA #/Case ID/Reference #: [id number]

## 2023-09-21 NOTE — Telephone Encounter (Signed)
 Noted

## 2023-10-04 ENCOUNTER — Other Ambulatory Visit: Payer: Self-pay

## 2023-10-04 DIAGNOSIS — Z794 Long term (current) use of insulin: Secondary | ICD-10-CM

## 2023-10-04 NOTE — Telephone Encounter (Signed)
 Toujeo  Last OV:  08/29/23, 4-6 wk DM f/u Next OV:  01/16/24, CPE

## 2023-10-04 NOTE — Telephone Encounter (Signed)
 Copied from CRM [id number]. Topic: Clinical - Medication Question >> Oct 04, 2023  1:53 PM Chasity T wrote: Reason for CRM: Patient is calling in for medication insulin  glargine, 1 Unit Dial, (TOUJEO  SOLOSTAR) 300 UNIT/ML Solostar Pen. He states that insurance will not pay for it until the end of the month but he has not taken the shot in over a week. He states that dr told him to take 28 units a day but on the medication it shows 15 units which is having him to run out early and he needs another prescription to be able to get the medication that's needed.  The pharmacy gave him the 15 units but PCP did prescribe the 28 units as well. Please contact pharmacy and patient regarding the medication.

## 2023-10-04 NOTE — Addendum Note (Signed)
 Addended by: Wendel Homeyer on: 10/04/2023 03:43 PM   Modules accepted: Orders

## 2023-10-05 MED ORDER — TOUJEO SOLOSTAR 300 UNIT/ML ~~LOC~~ SOPN: 28.0000 [IU] | PEN_INJECTOR | Freq: Every day | SUBCUTANEOUS | 3 refills | Status: AC

## 2023-10-05 NOTE — Addendum Note (Signed)
 Addended by: RILLA BALLER on: 10/05/2023 09:57 AM   Modules accepted: Orders

## 2023-10-05 NOTE — Telephone Encounter (Addendum)
 Plz notify pt I've sent in new instructions 28u Toujeo  daily, with 3 month supply sent to pharmacy. Toujeo  dose was increased by endocrinology on 08/22/2023.  Let us  know if any trouble getting this filled.  If endo continues titrating insulin , may be best to get them to fill med to ensure instructions are most up to date.

## 2023-10-05 NOTE — Telephone Encounter (Signed)
Spoke with pt relaying Dr. G's message. Pt verbalizes understanding.  

## 2023-10-07 ENCOUNTER — Other Ambulatory Visit: Payer: Self-pay | Admitting: Family Medicine

## 2023-10-07 DIAGNOSIS — N529 Male erectile dysfunction, unspecified: Secondary | ICD-10-CM

## 2023-10-11 ENCOUNTER — Other Ambulatory Visit: Payer: Self-pay | Admitting: Family Medicine

## 2023-10-11 NOTE — Telephone Encounter (Unsigned)
 Copied from CRM [id number]. Topic: Clinical - Medication Refill >> Oct 11, 2023  2:57 PM Thersia C wrote: Medication: tirzepatide  (MOUNJARO ) 5 MG/0.5ML Pen - patient called in stated he is out of medication and has to take it tomorrow   Has the patient contacted their pharmacy? Yes (Agent: If no, request that the patient contact the pharmacy for the refill. If patient does not wish to contact the pharmacy document the reason why and proceed with request.) (Agent: If yes, when and what did the pharmacy advise?)  This is the patient's preferred pharmacy:  [facility] [address] [place] - [address] [place] [address] Phone: [phone] Fax: [phone]  Is this the correct pharmacy for this prescription? Yes If no, delete pharmacy and type the correct one.   Has the prescription been filled recently? No  Is the patient out of the medication? Yes  Has the patient been seen for an appointment in the last year OR does the patient have an upcoming appointment? Yes  Can we respond through MyChart? Yes  Agent: Please be advised that Rx refills may take up to 3 business days. We ask that you follow-up with your pharmacy.

## 2023-10-11 NOTE — Telephone Encounter (Signed)
 Called patient he states that he was given in the past by endocrinology in the past. He no longer wants to be seen there due to copay $75. He would like refill from our office. Advised I will send to pcp for review and reach out as soon as we have directions.

## 2023-10-12 MED ORDER — TIRZEPATIDE 5 MG/0.5ML ~~LOC~~ SOAJ: 5.0000 mg | SUBCUTANEOUS | 6 refills | Status: AC

## 2023-10-12 NOTE — Telephone Encounter (Signed)
 Plz notify mounjaro  5mg  weekly dose refilled.  Had he discussed titrating mounjaro  dose with endo if tolerated?

## 2023-10-17 ENCOUNTER — Ambulatory Visit: Admitting: Dietician

## 2023-10-17 ENCOUNTER — Other Ambulatory Visit: Payer: Self-pay | Admitting: Internal Medicine

## 2023-10-28 NOTE — Progress Notes (Deleted)
 Cardiology Clinic Note   Date: 10/28/2023 ID: RENDELL THIVIERGE, DOB Feb 13, 1981, MRN [id number]  [facility] Providers Cardiologist:  Lonni Hanson, MD Electrophysiologist:  Danelle Birmingham, MD   Chief Complaint   Alvin Phillips is a 43 y.o. male who presents to the clinic today for ***  Patient Profile   Alvin Phillips is followed by Dr. Hanson Dr. Birmingham for the history outlined below.       Past medical history significant for: Chronic HFrEF/nonischemic cardiomyopathy. R/LHC 12/18/2017: Normal coronary arteries.  Severely reduced LV systolic function by echo.  LV angiography not performed.  RHC showed severely elevated filling pressures with pulmonary capillary wedge pressure 33 mmHg, moderate to severe pulmonary hypertension and moderately reduced cardiac output 4.8 with cardiac index 1.79. Echo 01/14/2022: EF 35 to 40%.  Global hypokinesis.  Moderately dilated internal LV cavity.  Grade II DD.  Normal RV size/function.  Moderate LAE.  Mild MR.*** Echo 08/29/2023: EF 35 to 40%.  Global hypokinesis.  Indeterminate diastolic parameters.  Normal RV size/function.  Mild MR. Wolff-Parkinson-White syndrome. Catheter ablation 12/22/2017. Hypertension. Hyperlipidemia. Lipid panel 01/04/2023: LDL 62, HDL 28, TG 312, total 152. OSA. GERD. T2DM.  In summary, patient has a history of Wolff-Parkinson-White syndrome with a past history of noncompliance with hypertensive therapy and cardiology follow-up.  He was admitted to [facility] in late September 2019 with complaints of persistent tachycardia and dyspnea.  He was found to be in SVT at a rate of 190.  He converted spontaneously and was noted to have delta wave consistent with Wolff-Parkinson-White syndrome.  CTA of the chest was negative for PE but did show interstitial edema.  Echo showed EF 20 to 25% with diffuse hypokinesis.  R/LHC demonstrated normal coronary arteries and severely elevated filling pressures as detailed above.  He was  transferred to [facility] and underwent catheter ablation which was initially successful but then he was noted to have recurrent delta waves and was placed on sotalol .  He last saw Dr. Birmingham in January 2022 and noted nonadherence to medications.  He was restarted on metoprolol .  Repeat echo October 2023 showed EF 35 to 40% as detailed above.  Patient was seen in the office on 05/05/2022 for routine follow-up.  He reported being off medications a few weeks prior secondary to insurance issues but had restarted his regimen.  He had been prescribed dapagliflozin  by PCP but had not started it at the time of his visit.  He had previously been on empagliflozin  but was intolerant secondary to balanitis.  He denied palpitations at that time.  He was instructed to start low-dose dapagliflozin  with goal to increase to 10 mg if tolerated.  He was referred to sleep medicine for discussion of initiation of CPAP versus repeat sleep study.   Patient was last seen in the office by me on 07/18/2023 for routine follow-up.  Overall patient was doing well.  He endorsed a diet high in fried and salty foods as well as a lot of sweets.  He admitted to taking Entresto  only once a day secondary to forgetting evening dose.  Medication adherence was stressed.  Discussed changing medications to an ARB if he continued to forget doses.  Echo demonstrated EF 35 to 40%.     History of Present Illness    Today, patient ***  Chronic HFrEF/nonischemic cardiomyopathy [facility] September 2019 showed normal coronary arteries, severely elevated filling pressures with moderate to severe pulmonary hypertension.  Echo June 2025 showed EF 35  to 40%, normal RV size/function, mild MR.  Patient*** - Continue Entresto , Toprol , Lasix  as needed.   Wolff-Parkinson-White syndrome Ablation October 2019.  Previously on sotalol .  Patient denies palpitations.*** - Continue Toprol .   Hypertension BP today***. No report of headaches or dizziness.  -  Continue amlodipine , Toprol , Entresto .   Hyperlipidemia LDL 62 October 2024, at goal.*** - Continue atorvastatin .  ROS: All other systems reviewed and are otherwise negative except as noted in History of Present Illness.  EKGs/Labs Reviewed        01/04/2023: ALT 19; AST 14 08/29/2023: BUN 13; Creatinine, Ser 0.86; Potassium 4.3; Sodium 139   07/18/2023: Hemoglobin 13.6; WBC 7.1   01/04/2023: TSH 0.80   No results found for requested labs within last 365 days.  ***  Risk Assessment/Calculations    {Does this patient have ATRIAL FIBRILLATION?:[phone]} No BP recorded.  {Refresh Note OR Click here to enter BP  :1}***        Physical Exam    VS:  There were no vitals taken for this visit. , BMI There is no height or weight on file to calculate BMI.  GEN: Well nourished, well developed, in no acute distress. Neck: No JVD or carotid bruits. Cardiac: *** RRR. *** No murmur. No rubs or gallops.   Respiratory:  Respirations regular and unlabored. Clear to auscultation without rales, wheezing or rhonchi. GI: Soft, nontender, nondistended. Extremities: Radials/DP/PT 2+ and equal bilaterally. No clubbing or cyanosis. No edema ***  Skin: Warm and dry, no rash. Neuro: Strength intact.  Assessment & Plan   ***  Disposition: ***     {Are you ordering a CV Procedure (e.g. stress test, cath, DCCV, TEE, etc)?   Press F2        :[id number]}   Signed, Barnie HERO. Abed Schar, DNP, NP-C

## 2023-10-31 ENCOUNTER — Ambulatory Visit: Attending: Student | Admitting: Student

## 2023-12-12 ENCOUNTER — Other Ambulatory Visit: Payer: Self-pay | Admitting: Internal Medicine

## 2024-01-05 ENCOUNTER — Other Ambulatory Visit: Payer: 59

## 2024-01-08 ENCOUNTER — Other Ambulatory Visit: Payer: Self-pay | Admitting: Family Medicine

## 2024-01-08 DIAGNOSIS — E538 Deficiency of other specified B group vitamins: Secondary | ICD-10-CM

## 2024-01-08 DIAGNOSIS — E1129 Type 2 diabetes mellitus with other diabetic kidney complication: Secondary | ICD-10-CM

## 2024-01-08 DIAGNOSIS — E1169 Type 2 diabetes mellitus with other specified complication: Secondary | ICD-10-CM

## 2024-01-09 ENCOUNTER — Other Ambulatory Visit (INDEPENDENT_AMBULATORY_CARE_PROVIDER_SITE_OTHER)

## 2024-01-09 DIAGNOSIS — E1169 Type 2 diabetes mellitus with other specified complication: Secondary | ICD-10-CM

## 2024-01-09 DIAGNOSIS — E1129 Type 2 diabetes mellitus with other diabetic kidney complication: Secondary | ICD-10-CM | POA: Diagnosis not present

## 2024-01-09 DIAGNOSIS — E785 Hyperlipidemia, unspecified: Secondary | ICD-10-CM

## 2024-01-09 DIAGNOSIS — E538 Deficiency of other specified B group vitamins: Secondary | ICD-10-CM

## 2024-01-09 DIAGNOSIS — R809 Proteinuria, unspecified: Secondary | ICD-10-CM | POA: Diagnosis not present

## 2024-01-09 DIAGNOSIS — Z794 Long term (current) use of insulin: Secondary | ICD-10-CM | POA: Diagnosis not present

## 2024-01-09 LAB — COMPREHENSIVE METABOLIC PANEL WITH GFR
ALT: 19 U/L (ref 0–53)
AST: 13 U/L (ref 0–37)
Albumin: 4.5 g/dL (ref 3.5–5.2)
Alkaline Phosphatase: 81 U/L (ref 39–117)
BUN: 12 mg/dL (ref 6–23)
CO2: 28 meq/L (ref 19–32)
Calcium: 9.1 mg/dL (ref 8.4–10.5)
Chloride: 98 meq/L (ref 96–112)
Creatinine, Ser: 0.86 mg/dL (ref 0.40–1.50)
GFR: 105.17 mL/min (ref >60.00)
Glucose, Bld: 258 mg/dL — ABNORMAL HIGH (ref 70–99)
Potassium: 4.2 meq/L (ref 3.5–5.1)
Sodium: 138 meq/L (ref 135–145)
Total Bilirubin: 0.5 mg/dL (ref 0.2–1.2)
Total Protein: 7.5 g/dL (ref 6.0–8.3)

## 2024-01-09 LAB — VITAMIN B12: Vitamin B-12: 446 pg/mL (ref 211–911)

## 2024-01-09 LAB — LIPID PANEL
Cholesterol: 133 mg/dL (ref 0–200)
HDL: 29.5 mg/dL — ABNORMAL LOW (ref >39.00)
LDL Cholesterol: 65 mg/dL (ref 0–99)
NonHDL: 103.77
Total CHOL/HDL Ratio: 5
Triglycerides: 192 mg/dL — ABNORMAL HIGH (ref 0.0–149.0)
VLDL: 38.4 mg/dL (ref 0.0–40.0)

## 2024-01-09 LAB — MICROALBUMIN / CREATININE URINE RATIO
Creatinine,U: 90.4 mg/dL
Microalb Creat Ratio: 331.8 mg/g — ABNORMAL HIGH (ref 0.0–30.0)
Microalb, Ur: 30 mg/dL — ABNORMAL HIGH (ref 0.0–1.9)

## 2024-01-09 LAB — HEMOGLOBIN A1C: Hgb A1c MFr Bld: 9.7 % — ABNORMAL HIGH (ref 4.6–6.5)

## 2024-01-11 ENCOUNTER — Ambulatory Visit: Payer: Self-pay | Admitting: Family Medicine

## 2024-01-12 ENCOUNTER — Encounter: Payer: 59 | Admitting: Family Medicine

## 2024-01-16 ENCOUNTER — Encounter: Admitting: Family Medicine

## 2024-01-24 LAB — OPHTHALMOLOGY REPORT-SCANNED

## 2024-01-31 ENCOUNTER — Other Ambulatory Visit: Payer: Self-pay | Admitting: Family Medicine

## 2024-01-31 ENCOUNTER — Encounter: Payer: Self-pay | Admitting: Family Medicine

## 2024-01-31 ENCOUNTER — Ambulatory Visit: Admitting: Family Medicine

## 2024-01-31 VITALS — BP 138/84 | HR 63 | Temp 98.7°F | Ht 69.25 in | Wt 357.1 lb

## 2024-01-31 DIAGNOSIS — E538 Deficiency of other specified B group vitamins: Secondary | ICD-10-CM

## 2024-01-31 DIAGNOSIS — E1159 Type 2 diabetes mellitus with other circulatory complications: Secondary | ICD-10-CM | POA: Diagnosis not present

## 2024-01-31 DIAGNOSIS — K219 Gastro-esophageal reflux disease without esophagitis: Secondary | ICD-10-CM

## 2024-01-31 DIAGNOSIS — E785 Hyperlipidemia, unspecified: Secondary | ICD-10-CM

## 2024-01-31 DIAGNOSIS — Z0001 Encounter for general adult medical examination with abnormal findings: Secondary | ICD-10-CM | POA: Diagnosis not present

## 2024-01-31 DIAGNOSIS — I5022 Chronic systolic (congestive) heart failure: Secondary | ICD-10-CM

## 2024-01-31 DIAGNOSIS — Z794 Long term (current) use of insulin: Secondary | ICD-10-CM

## 2024-01-31 DIAGNOSIS — G4733 Obstructive sleep apnea (adult) (pediatric): Secondary | ICD-10-CM

## 2024-01-31 DIAGNOSIS — E1169 Type 2 diabetes mellitus with other specified complication: Secondary | ICD-10-CM | POA: Diagnosis not present

## 2024-01-31 DIAGNOSIS — E1121 Type 2 diabetes mellitus with diabetic nephropathy: Secondary | ICD-10-CM | POA: Diagnosis not present

## 2024-01-31 DIAGNOSIS — I152 Hypertension secondary to endocrine disorders: Secondary | ICD-10-CM

## 2024-01-31 DIAGNOSIS — I456 Pre-excitation syndrome: Secondary | ICD-10-CM

## 2024-01-31 DIAGNOSIS — I428 Other cardiomyopathies: Secondary | ICD-10-CM

## 2024-01-31 LAB — URINALYSIS, ROUTINE W REFLEX MICROSCOPIC
Bilirubin Urine: NEGATIVE
Hgb urine dipstick: NEGATIVE
Ketones, ur: NEGATIVE
Leukocytes,Ua: NEGATIVE
Nitrite: NEGATIVE
Specific Gravity, Urine: 1.015 (ref 1.000–1.030)
Total Protein, Urine: NEGATIVE
Urine Glucose: >=1000 — AB
Urobilinogen, UA: 0.2 (ref 0.0–1.0)
pH: 6.5 (ref 5.0–8.0)

## 2024-01-31 MED ORDER — ATORVASTATIN CALCIUM 20 MG PO TABS: 20.0000 mg | ORAL_TABLET | Freq: Every day | ORAL | 3 refills | Status: AC

## 2024-01-31 MED ORDER — AMLODIPINE BESYLATE 5 MG PO TABS: 5.0000 mg | ORAL_TABLET | Freq: Every day | ORAL | 3 refills | Status: AC

## 2024-01-31 MED ORDER — ATORVASTATIN CALCIUM 20 MG PO TABS: 20.0000 mg | ORAL_TABLET | Freq: Every day | ORAL | 3 refills | Status: DC

## 2024-01-31 MED ORDER — AMLODIPINE BESYLATE 5 MG PO TABS: 5.0000 mg | ORAL_TABLET | Freq: Every day | ORAL | 3 refills | Status: DC

## 2024-01-31 MED ORDER — PANTOPRAZOLE SODIUM 20 MG PO TBEC: 20.0000 mg | DELAYED_RELEASE_TABLET | Freq: Every day | ORAL | 5 refills | Status: DC | PRN

## 2024-01-31 MED ORDER — PANTOPRAZOLE SODIUM 20 MG PO TBEC: 20.0000 mg | DELAYED_RELEASE_TABLET | Freq: Every day | ORAL | 5 refills | Status: AC | PRN

## 2024-01-31 NOTE — Progress Notes (Signed)
 Ph: [phone] Fax: [phone]   Patient ID: Alvin Phillips, male    DOB: 08/15/1980, 43 y.o.   MRN: [id number]  This visit was conducted in person.  BP 138/84   Pulse 63   Temp 98.7 F (37.1 C)   Ht 5' 9.25 (1.759 m)   Wt (!) 357 lb 2 oz (162 kg)   SpO2 97%   BMI 52.36 kg/m    CC: CPE Subjective:   HPI: Alvin Phillips is a 43 y.o. male presenting on 01/31/2024 for Annual Exam   34 month old daughter at home.  Planning switch from [facility] to [facility].   Known type 2 diabetic, OSA, morbid obesity, GERD, HFrEF (nonischemic), WPW syndrome s/p failed accessory pathway ablation, HTN, HLD. Sees CHF clinic Barnie Hila last seen 06/2023.    Avoiding SGLT2i in h/o scrotal infections, balanitis, hidradenitis.    H/o intolerance to metformin  IR and XR formulations (nausea).  Established with [facility] endocrinology Harlene Bill NP on 07/2023, most recently seen 08/2023 and started on humalog TID AC and SSI (100-120:2u, 121-150:4u, 151-200:5u, 201-250:6u, 251-300:8u, 301-350:10u, >350:12u). Continues toujeo  28u at bedtime, mounjaro  5mg  weekly, glimepiride  2mg  daily. Notes high copay to see endo. Has not been using mealtime insulin .  CGM data: FL3+ 30 day average sugar: 227, time in range 16%, hypoglycemia 0%, stage 1 hyperglycemia 55%, stage 2 hyperglycemia 29%.     Preventative: Colon cancer screening - not dye, no fmhx Prostate cancer screening - not due, no fmhx Lung cancer screening - not eligible  Flu shot - declined COVID shot - declined Td 01/2020 Pneumonia shot - discussed, declines Shingrix - not due Advanced directive discussion - not discussed Seat belt use discussed Sunscreen use discussed. No changing moles on skin.  Smoking - a few cigarettes/day - planning to stop Alcohol - fifth of liquor lasts 1 month Dentist - due Eye exam - yearly Bladder - no incontinence   Lives with Alvin Phillips, 2 children  He breeds dogs Medical Sales Representative). Occ:  [employer]  Activity: walking regularly at work  Diet: poor, large portions     Relevant past medical, surgical, family and social history reviewed and updated as indicated. Interim medical history since our last visit reviewed. Allergies and medications reviewed and updated. Outpatient Medications Prior to Visit  Medication Sig Dispense Refill   Blood Glucose Monitoring Suppl DEVI 1 each by Does not apply route in the morning, at noon, and at bedtime. May substitute to any manufacturer covered by patient's insurance. 1 each 0   Continuous Glucose Sensor (FREESTYLE LIBRE 3 PLUS SENSOR) MISC E11.69 Change sensor every 15 days. 2 each 11   furosemide  (LASIX ) 40 MG tablet TAKE 1 TABLET BY MOUTH ONCE DAILY AS NEEDED FOR SWELLING OR WEIGHT GAIN OF 2 POUNDS OVERNIGHT OR 5 POUNDS IN A WEEK 30 tablet 0   glimepiride  (AMARYL ) 2 MG tablet Take 1 tablet (2 mg total) by mouth daily before breakfast.     insulin  glargine, 1 Unit Dial, (TOUJEO  SOLOSTAR) 300 UNIT/ML Solostar Pen Inject 28 Units into the skin daily. 9 mL 3   metoprolol  succinate (TOPROL -XL) 100 MG 24 hr tablet Take 1 tablet (100 mg total) by mouth daily. 30 tablet 10   sacubitril -valsartan  (ENTRESTO ) 97-103 MG Take 1 tablet by mouth twice daily 180 tablet 2   sildenafil  (VIAGRA ) 100 MG tablet TAKE 1 TABLET BY MOUTH ONCE DAILY AS NEEDED FOR ERECTILE DYSFUNCTION 6 tablet 3   tirzepatide  (MOUNJARO ) 5  MG/0.5ML Pen Inject 5 mg into the skin once a week. 2 mL 6   amLODipine  (NORVASC ) 5 MG tablet Take 1 tablet (5 mg total) by mouth daily. 90 tablet 4   atorvastatin  (LIPITOR) 20 MG tablet Take 1 tablet (20 mg total) by mouth daily. 90 tablet 4   pantoprazole  (PROTONIX ) 20 MG tablet Take 1 tablet (20 mg total) by mouth daily as needed for heartburn. 30 tablet 5   No facility-administered medications prior to visit.     Per HPI unless specifically indicated in ROS section below Review of Systems  Constitutional:  Negative for activity change,  appetite change, chills, fatigue, fever and unexpected weight change.  HENT:  Positive for congestion (recently). Negative for hearing loss.   Eyes:  Negative for visual disturbance.  Respiratory:  Positive for cough (mild). Negative for chest tightness, shortness of breath and wheezing.   Cardiovascular:  Negative for chest pain, palpitations and leg swelling.  Gastrointestinal:  Negative for abdominal distention, abdominal pain, blood in stool, constipation, diarrhea, nausea and vomiting.  Genitourinary:  Negative for difficulty urinating and hematuria.  Musculoskeletal:  Negative for arthralgias, myalgias and neck pain.  Skin:  Negative for rash.  Neurological:  Negative for dizziness, seizures, syncope and headaches.  Hematological:  Negative for adenopathy. Does not bruise/bleed easily.  Psychiatric/Behavioral:  Negative for dysphoric mood. The patient is not nervous/anxious.     Objective:  BP 138/84   Pulse 63   Temp 98.7 F (37.1 C)   Ht 5' 9.25 (1.759 m)   Wt (!) 357 lb 2 oz (162 kg)   SpO2 97%   BMI 52.36 kg/m   Wt Readings from Last 3 Encounters:  01/31/24 (!) 357 lb 2 oz (162 kg)  09/05/23 (!) 357 lb (161.9 kg)  08/29/23 (!) 350 lb 4 oz (158.9 kg)      Physical Exam Vitals and nursing note reviewed.  Constitutional:      General: He is not in acute distress.    Appearance: Normal appearance. He is well-developed. He is not ill-appearing.  HENT:     Head: Normocephalic and atraumatic.     Right Ear: Hearing, tympanic membrane, ear canal and external ear normal.     Left Ear: Hearing, tympanic membrane, ear canal and external ear normal.     Mouth/Throat:     Mouth: Mucous membranes are moist.     Pharynx: Oropharynx is clear. No oropharyngeal exudate or posterior oropharyngeal erythema.  Eyes:     General: No scleral icterus.    Extraocular Movements: Extraocular movements intact.     Conjunctiva/sclera: Conjunctivae normal.     Pupils: Pupils are equal,  round, and reactive to light.  Neck:     Thyroid : No thyroid  mass or thyromegaly.  Cardiovascular:     Rate and Rhythm: Normal rate and regular rhythm.     Pulses: Normal pulses.          Radial pulses are 2+ on the right side and 2+ on the left side.     Heart sounds: Normal heart sounds. No murmur heard. Pulmonary:     Effort: Pulmonary effort is normal. No respiratory distress.     Breath sounds: Normal breath sounds. No wheezing, rhonchi or rales.  Abdominal:     General: Bowel sounds are normal. There is no distension.     Palpations: Abdomen is soft. There is no mass.     Tenderness: There is no abdominal tenderness. There is no guarding or rebound.  Hernia: No hernia is present.  Musculoskeletal:        General: Normal range of motion.     Cervical back: Normal range of motion and neck supple.     Right lower leg: No edema.     Left lower leg: No edema.  Lymphadenopathy:     Cervical: No cervical adenopathy.  Skin:    General: Skin is warm and dry.     Findings: No rash.  Neurological:     General: No focal deficit present.     Mental Status: He is alert and oriented to person, place, and time.  Psychiatric:        Mood and Affect: Mood normal.        Behavior: Behavior normal.        Thought Content: Thought content normal.        Judgment: Judgment normal.       Results for orders placed or performed in visit on 01/26/24  OPHTHALMOLOGY REPORT-SCANNED   Collection Time: 01/24/24 10:48 AM  Result Value Ref Range   HM Diabetic Eye Exam No Retinopathy No Retinopathy   A Comment      Assessment & Plan:   Problem List Items Addressed This Visit     Encounter for general adult medical examination with abnormal findings - Primary (Chronic)   Preventative protocols reviewed and updated unless pt declined. Discussed healthy diet and lifestyle.       Type 2 diabetes mellitus with other specified complication (HCC)   Chronic, saw endo earlier in the year, due  for f/u.  Best control to date since starting mounjaro  and toujeo  Advised he call to schedule f/u.       Relevant Medications   atorvastatin  (LIPITOR) 20 MG tablet   OSA (obstructive sleep apnea)   H/o severe OSA, not on CPAP. Last eval with sleep lab polysomnography 2016. Agrees to return to pulm for re eavaluation. Pulm referral placed.       Relevant Orders   Ambulatory referral to Pulmonology   Obesity, morbid, BMI 50 or higher (HCC)   On Mounjaro . Rec endo f/u to consider titrating dose.       Relevant Orders   Ambulatory referral to Pulmonology   GERD (gastroesophageal reflux disease)   Continue PRN pantoprazole  20mg       Relevant Medications   pantoprazole  (PROTONIX ) 20 MG tablet   Hypertension associated with type 2 diabetes mellitus (HCC)   Chronic, stable on current regimen.       Relevant Medications   amLODipine  (NORVASC ) 5 MG tablet   atorvastatin  (LIPITOR) 20 MG tablet   WPW (Wolff-Parkinson-White syndrome)   Rec pulm f/u      Relevant Medications   amLODipine  (NORVASC ) 5 MG tablet   atorvastatin  (LIPITOR) 20 MG tablet   Chronic HFrEF (heart failure with reduced ejection fraction) (HCC)   Encouraged cards f/u.  Continue amlodipine , entresto , lasix .       Relevant Medications   amLODipine  (NORVASC ) 5 MG tablet   atorvastatin  (LIPITOR) 20 MG tablet   Nonischemic cardiomyopathy (HCC)   Relevant Medications   amLODipine  (NORVASC ) 5 MG tablet   atorvastatin  (LIPITOR) 20 MG tablet   Hyperlipidemia associated with type 2 diabetes mellitus (HCC)   Chronic, stable on atorvastatin  20mg  daily.  The 10-year ASCVD risk score (Arnett DK, et al., 2019) is: 14.2%   Values used to calculate the score:     Age: 24 years     Clincally relevant sex: Male  Is Non-Hispanic African American: Yes     Diabetic: Yes     Tobacco smoker: No     Systolic Blood Pressure: 138 mmHg     Is BP treated: Yes     HDL Cholesterol: 29.5 mg/dL     Total Cholesterol: 133 mg/dL  d      Relevant Medications   amLODipine  (NORVASC ) 5 MG tablet   atorvastatin  (LIPITOR) 20 MG tablet   Type 2 diabetes mellitus with macroalbuminuric diabetic nephropathy (HCC)   Avoiding SGLT2i in h/o recurrent boils, hydradenitis.  Appreciate endo care.       Relevant Medications   atorvastatin  (LIPITOR) 20 MG tablet   Other Relevant Orders   Urinalysis, Routine w reflex microscopic   Protein, urine, 24 hour   Low serum vitamin B12   Stable period, not on B12 replacement.         Meds ordered this encounter  Medications   DISCONTD: amLODipine  (NORVASC ) 5 MG tablet    Sig: Take 1 tablet (5 mg total) by mouth daily.    Dispense:  90 tablet    Refill:  3   DISCONTD: atorvastatin  (LIPITOR) 20 MG tablet    Sig: Take 1 tablet (20 mg total) by mouth daily.    Dispense:  90 tablet    Refill:  3   DISCONTD: pantoprazole  (PROTONIX ) 20 MG tablet    Sig: Take 1 tablet (20 mg total) by mouth daily as needed for heartburn.    Dispense:  30 tablet    Refill:  5   amLODipine  (NORVASC ) 5 MG tablet    Sig: Take 1 tablet (5 mg total) by mouth daily.    Dispense:  90 tablet    Refill:  3   atorvastatin  (LIPITOR) 20 MG tablet    Sig: Take 1 tablet (20 mg total) by mouth daily.    Dispense:  90 tablet    Refill:  3   pantoprazole  (PROTONIX ) 20 MG tablet    Sig: Take 1 tablet (20 mg total) by mouth daily as needed for heartburn.    Dispense:  30 tablet    Refill:  5    Orders Placed This Encounter  Procedures   Urinalysis, Routine w reflex microscopic   Protein, urine, 24 hour   Ambulatory referral to Pulmonology    Referral Priority:   Routine    Referral Type:   Consultation    Referral Reason:   Specialty Services Required    Requested Specialty:   Pulmonary Disease    Number of Visits Requested:   1    Patient Instructions  Elevated urine protein levels suggest kidney impairment but blood kidney tests were normal. Check urinalysis today then pass by lab to pick up 24  hour urine collection jug.  Schedule follow up appointment with cardiology and diabetes doctor.  Good to see you today  Return in 6 months for diabetes follow up visit   Follow up plan: Return in about 6 months (around 07/30/2024) for follow up visit.  Anton Blas, MD

## 2024-01-31 NOTE — Assessment & Plan Note (Addendum)
 H/o severe OSA, not on CPAP. Last eval with sleep lab polysomnography 2016. Agrees to return to pulm for re eavaluation. Pulm referral placed.

## 2024-01-31 NOTE — Assessment & Plan Note (Addendum)
 Encouraged cards f/u.  Continue amlodipine , entresto , lasix .

## 2024-01-31 NOTE — Assessment & Plan Note (Addendum)
 Continue PRN pantoprazole  20mg 

## 2024-01-31 NOTE — Assessment & Plan Note (Signed)
 Chronic, stable on current regimen.

## 2024-01-31 NOTE — Assessment & Plan Note (Signed)
 Chronic, stable on atorvastatin  20mg  daily.  The 10-year ASCVD risk score (Arnett DK, et al., 2019) is: 14.2%   Values used to calculate the score:     Age: 43 years     Clincally relevant sex: Male     Is Non-Hispanic African American: Yes     Diabetic: Yes     Tobacco smoker: No     Systolic Blood Pressure: 138 mmHg     Is BP treated: Yes     HDL Cholesterol: 29.5 mg/dL     Total Cholesterol: 133 mg/dL d

## 2024-01-31 NOTE — Assessment & Plan Note (Signed)
 Preventative protocols reviewed and updated unless pt declined. Discussed healthy diet and lifestyle.

## 2024-01-31 NOTE — Assessment & Plan Note (Signed)
 Rec pulm f/u

## 2024-01-31 NOTE — Addendum Note (Signed)
 Addended by: LORELLE ROCKY BRAVO on: 01/31/2024 08:49 AM   Modules accepted: Orders

## 2024-01-31 NOTE — Assessment & Plan Note (Signed)
 Chronic, saw endo earlier in the year, due for f/u.  Best control to date since starting mounjaro  and toujeo  Advised he call to schedule f/u.

## 2024-01-31 NOTE — Patient Instructions (Addendum)
 Elevated urine protein levels suggest kidney impairment but blood kidney tests were normal. Check urinalysis today then pass by lab to pick up 24 hour urine collection jug.  Schedule follow up appointment with cardiology and diabetes doctor.  Good to see you today  Return in 6 months for diabetes follow up visit

## 2024-01-31 NOTE — Assessment & Plan Note (Signed)
 On Mounjaro . Rec endo f/u to consider titrating dose.

## 2024-01-31 NOTE — Assessment & Plan Note (Addendum)
 Stable period, not on B12 replacement.

## 2024-01-31 NOTE — Assessment & Plan Note (Signed)
 Avoiding SGLT2i in h/o recurrent boils, hydradenitis.  Appreciate endo care.

## 2024-02-01 ENCOUNTER — Ambulatory Visit: Payer: Self-pay | Admitting: Family Medicine

## 2024-03-08 ENCOUNTER — Other Ambulatory Visit: Payer: Self-pay | Admitting: Family Medicine

## 2024-03-08 DIAGNOSIS — E1129 Type 2 diabetes mellitus with other diabetic kidney complication: Secondary | ICD-10-CM

## 2024-03-08 NOTE — Telephone Encounter (Signed)
 Glimepiride  Last filled:  01/06/24 Last OV:  01/31/24, CPE Next OV:  none
